# Patient Record
Sex: Male | Born: 1975 | Race: Black or African American | Hispanic: No | Marital: Single | Smoking: Former smoker
Health system: Southern US, Community
[De-identification: ages and names within clinical notes are randomized; demographics above are authoritative.]

## PROBLEM LIST (undated history)

## (undated) DIAGNOSIS — C9000 Multiple myeloma not having achieved remission: Secondary | ICD-10-CM

## (undated) DIAGNOSIS — I10 Essential (primary) hypertension: Secondary | ICD-10-CM

## (undated) DIAGNOSIS — I82402 Acute embolism and thrombosis of unspecified deep veins of left lower extremity: Secondary | ICD-10-CM

## (undated) DIAGNOSIS — F32A Depression, unspecified: Secondary | ICD-10-CM

## (undated) DIAGNOSIS — F329 Major depressive disorder, single episode, unspecified: Secondary | ICD-10-CM

## (undated) HISTORY — PX: BONE BIOPSY: SHX375

## (undated) HISTORY — DX: Multiple myeloma not having achieved remission: C90.00

## (undated) HISTORY — DX: Essential (primary) hypertension: I10

## (undated) HISTORY — DX: Major depressive disorder, single episode, unspecified: F32.9

## (undated) HISTORY — PX: OTHER SURGICAL HISTORY: SHX169

## (undated) HISTORY — DX: Acute embolism and thrombosis of unspecified deep veins of left lower extremity: I82.402

## (undated) HISTORY — DX: Depression, unspecified: F32.A

---

## 1997-07-06 ENCOUNTER — Emergency Department (HOSPITAL_COMMUNITY): Admission: EM | Admit: 1997-07-06 | Discharge: 1997-07-06 | Payer: Self-pay | Admitting: Emergency Medicine

## 1998-09-10 ENCOUNTER — Encounter: Payer: Self-pay | Admitting: Emergency Medicine

## 1998-09-10 ENCOUNTER — Emergency Department (HOSPITAL_COMMUNITY): Admission: EM | Admit: 1998-09-10 | Discharge: 1998-09-10 | Payer: Self-pay | Admitting: Emergency Medicine

## 1998-10-07 ENCOUNTER — Emergency Department (HOSPITAL_COMMUNITY): Admission: EM | Admit: 1998-10-07 | Discharge: 1998-10-07 | Payer: Self-pay | Admitting: Emergency Medicine

## 1999-08-24 ENCOUNTER — Emergency Department (HOSPITAL_COMMUNITY): Admission: EM | Admit: 1999-08-24 | Discharge: 1999-08-24 | Payer: Self-pay | Admitting: Emergency Medicine

## 2001-01-20 ENCOUNTER — Emergency Department (HOSPITAL_COMMUNITY): Admission: EM | Admit: 2001-01-20 | Discharge: 2001-01-20 | Payer: Self-pay | Admitting: Emergency Medicine

## 2001-02-02 ENCOUNTER — Encounter: Payer: Self-pay | Admitting: Emergency Medicine

## 2001-02-02 ENCOUNTER — Emergency Department (HOSPITAL_COMMUNITY): Admission: EM | Admit: 2001-02-02 | Discharge: 2001-02-02 | Payer: Self-pay | Admitting: Emergency Medicine

## 2001-09-12 ENCOUNTER — Emergency Department (HOSPITAL_COMMUNITY): Admission: EM | Admit: 2001-09-12 | Discharge: 2001-09-12 | Payer: Self-pay

## 2002-09-06 ENCOUNTER — Emergency Department (HOSPITAL_COMMUNITY): Admission: EM | Admit: 2002-09-06 | Discharge: 2002-09-06 | Payer: Self-pay | Admitting: Emergency Medicine

## 2003-11-21 ENCOUNTER — Emergency Department (HOSPITAL_COMMUNITY): Admission: EM | Admit: 2003-11-21 | Discharge: 2003-11-21 | Payer: Self-pay | Admitting: Emergency Medicine

## 2003-12-09 ENCOUNTER — Emergency Department (HOSPITAL_COMMUNITY): Admission: EM | Admit: 2003-12-09 | Discharge: 2003-12-09 | Payer: Self-pay | Admitting: Emergency Medicine

## 2004-03-24 ENCOUNTER — Emergency Department (HOSPITAL_COMMUNITY): Admission: EM | Admit: 2004-03-24 | Discharge: 2004-03-26 | Payer: Self-pay | Admitting: Emergency Medicine

## 2010-07-10 ENCOUNTER — Emergency Department (HOSPITAL_COMMUNITY): Payer: Medicaid Other

## 2010-07-10 ENCOUNTER — Emergency Department (HOSPITAL_COMMUNITY)
Admission: EM | Admit: 2010-07-10 | Discharge: 2010-07-10 | Disposition: A | Discharge status: Home / Self Care | Payer: Medicaid Other | Attending: Emergency Medicine | Admitting: Emergency Medicine

## 2010-07-10 DIAGNOSIS — L089 Local infection of the skin and subcutaneous tissue, unspecified: Secondary | ICD-10-CM | POA: Insufficient documentation

## 2010-07-10 DIAGNOSIS — T148XXA Other injury of unspecified body region, initial encounter: Secondary | ICD-10-CM | POA: Insufficient documentation

## 2010-07-10 DIAGNOSIS — M7989 Other specified soft tissue disorders: Secondary | ICD-10-CM | POA: Insufficient documentation

## 2010-07-10 DIAGNOSIS — L84 Corns and callosities: Secondary | ICD-10-CM | POA: Insufficient documentation

## 2010-07-10 DIAGNOSIS — X58XXXA Exposure to other specified factors, initial encounter: Secondary | ICD-10-CM | POA: Insufficient documentation

## 2010-07-10 DIAGNOSIS — F319 Bipolar disorder, unspecified: Secondary | ICD-10-CM | POA: Insufficient documentation

## 2010-07-20 ENCOUNTER — Emergency Department (HOSPITAL_COMMUNITY)
Admission: EM | Admit: 2010-07-20 | Discharge: 2010-07-20 | Disposition: A | Discharge status: Home / Self Care | Payer: Medicaid Other | Attending: Emergency Medicine | Admitting: Emergency Medicine

## 2010-07-20 DIAGNOSIS — L600 Ingrowing nail: Secondary | ICD-10-CM | POA: Insufficient documentation

## 2010-07-20 DIAGNOSIS — M79609 Pain in unspecified limb: Secondary | ICD-10-CM | POA: Insufficient documentation

## 2013-01-30 ENCOUNTER — Ambulatory Visit (INDEPENDENT_AMBULATORY_CARE_PROVIDER_SITE_OTHER): Payer: BC Managed Care – PPO | Admitting: Family Medicine

## 2013-01-30 VITALS — BP 130/84 | HR 120 | Temp 98.7°F | Resp 18 | Ht 71.25 in | Wt 226.0 lb

## 2013-01-30 DIAGNOSIS — J209 Acute bronchitis, unspecified: Secondary | ICD-10-CM

## 2013-01-30 DIAGNOSIS — J111 Influenza due to unidentified influenza virus with other respiratory manifestations: Secondary | ICD-10-CM

## 2013-01-30 LAB — POCT INFLUENZA A/B
Influenza A, POC: POSITIVE
Influenza B, POC: NEGATIVE

## 2013-01-30 MED ORDER — OSELTAMIVIR PHOSPHATE 75 MG PO CAPS: 75.0000 mg | ORAL_CAPSULE | Freq: Two times a day (BID) | ORAL | Status: DC

## 2013-01-30 MED ORDER — IPRATROPIUM BROMIDE 0.03 % NA SOLN: 2.0000 | Freq: Four times a day (QID) | NASAL | Status: DC

## 2013-01-30 MED ORDER — HYDROCOD POLST-CHLORPHEN POLST 10-8 MG/5ML PO LQCR: 5.0000 mL | Freq: Two times a day (BID) | ORAL | Status: DC | PRN

## 2013-01-30 NOTE — Progress Notes (Signed)
Subjective:  This chart was scribed for Norberto Sorenson, MD at [facility] by Yevette Edwards, Scribe. This pt's care was started at 11:32 AM.   Patient ID: Derek [name], male    DOB: 15-Nov-1975, 37 y.o.   MRN: [id number]  Chief Complaint  Patient presents with  . Chest Congestion    X Saturday  . Cough    X Saturday  . Shortness of Breath    X Saturday    HPI  HPI Comments: Derek [name] is a 37 y.o. male who presents to the [facility]  complaining of a non-productive cough which has persisted for four days and which has been accompanied by chest congestion, SOB, a subjective fever, and chills. He has also experienced centrally-located chest pain and a headache following episodes of coughing. Additionally, the coughing has prevented him from sleeping well. He has used OTC cough and cold medication without relief. The pt denies experiencing palpitations, wheezing, myalgia, rhinorrhea, or a sore throat.   His heart rate was initially elevated, and, upon recheck, his heart rate was between 100-101.   He reports that he has been drinking plenty of fluids and voiding normally.   The pt reports that he did not receive an influenza vaccine this year, as is his baseline.   He is a non-smoker.   No current outpatient prescriptions on file prior to visit.   No current facility-administered medications on file prior to visit.    Past Medical History  Diagnosis Date  . Depression     Allergies  Allergen Reactions  . Aspirin Anxiety      Review of Systems  Constitutional: Positive for fever and chills.  HENT: Positive for congestion. Negative for rhinorrhea and sore throat.   Respiratory: Positive for cough and shortness of breath. Negative for wheezing.   Cardiovascular: Negative for palpitations.  Musculoskeletal: Negative for myalgias.    Triage Vitals: BP 130/84  Pulse 120  Temp(Src) 98.7 F (37.1 C) (Oral)  Resp 18  Ht 5' 11.25" (1.81 m)  Wt 226 lb  (102.513 kg)  BMI 31.29 kg/m2  SpO2 98%     Objective:   Physical Exam  Nursing note and vitals reviewed. Constitutional: He is oriented to person, place, and time. He appears well-developed and well-nourished. No distress.  HENT:  Head: Normocephalic and atraumatic.  Right Ear: Tympanic membrane and external ear normal. Tympanic membrane is not injected, not erythematous, not retracted and not bulging.  Left Ear: Tympanic membrane and external ear normal. Tympanic membrane is not injected, not erythematous, not retracted and not bulging.  Mouth/Throat: Posterior oropharyngeal erythema present.  Erythematous.   Eyes: EOM are normal.  Neck: Neck supple. No tracheal deviation present. No thyromegaly present.  Cardiovascular: Regular rhythm and normal heart sounds.   No murmur heard. Borderline tachycardic.   Pulmonary/Chest: Effort normal. No respiratory distress.  Musculoskeletal: Normal range of motion.  Lymphadenopathy:       Head (right side): No submental, no submandibular, no tonsillar, no preauricular and no posterior auricular adenopathy present.       Head (left side): No submental, no submandibular, no tonsillar, no preauricular and no posterior auricular adenopathy present.    He has no cervical adenopathy.  Neurological: He is alert and oriented to person, place, and time.  Skin: Skin is warm and dry.  Psychiatric: He has a normal mood and affect. His behavior is normal.   Results for orders placed in visit on 01/30/13  POCT INFLUENZA A/B  Result Value Range   Influenza A, POC Positive     Influenza B, POC Negative         Assessment & Plan:   Acute bronchitis - Plan: POCT Influenza A/B  Influenza with other respiratory manifestations  Meds ordered this encounter  Medications  . guaiFENesin-dextromethorphan (ROBITUSSIN DM) 100-10 MG/5ML syrup    Sig: Take 5 mLs by mouth every 4 (four) hours as needed for cough.  . DISCONTD: Pseudoeph-Doxylamine-DM-APAP  (NYQUIL PO)    Sig: Take by mouth.  Marland Kitchen ipratropium (ATROVENT) 0.03 % nasal spray    Sig: Place 2 sprays into the nose 4 (four) times daily.    Dispense:  30 mL    Refill:  1  . chlorpheniramine-HYDROcodone (TUSSIONEX PENNKINETIC ER) 10-8 MG/5ML LQCR    Sig: Take 5 mLs by mouth every 12 (twelve) hours as needed for cough (cough).    Dispense:  120 mL    Refill:  0  . oseltamivir (TAMIFLU) 75 MG capsule    Sig: Take 1 capsule (75 mg total) by mouth 2 (two) times daily.    Dispense:  10 capsule    Refill:  0    I personally performed the services described in this documentation, which was scribed in my presence. The recorded information has been reviewed and considered, and addended by me as needed.  Norberto Sorenson, MD MPH

## 2013-01-30 NOTE — Patient Instructions (Addendum)
Your elevated heart rate is likely due to the over-the-counter cough and cold medications you have been taking.  Please try to stay away from any of the products with pseudoephedrine or phenylephrine in them.  Bronchitis Bronchitis is the body's way of reacting to injury and/or infection (inflammation) of the bronchi. Bronchi are the air tubes that extend from the windpipe into the lungs. If the inflammation becomes severe, it may cause shortness of breath. CAUSES  Inflammation may be caused by:  A virus.  Germs (bacteria).  Dust.  Allergens.  Pollutants and many other irritants. The cells lining the bronchial tree are covered with tiny hairs (cilia). These constantly beat upward, away from the lungs, toward the mouth. This keeps the lungs free of pollutants. When these cells become too irritated and are unable to do their job, mucus begins to develop. This causes the characteristic cough of bronchitis. The cough clears the lungs when the cilia are unable to do their job. Without either of these protective mechanisms, the mucus would settle in the lungs. Then you would develop pneumonia. Smoking is a common cause of bronchitis and can contribute to pneumonia. Stopping this habit is the single most important thing you can do to help yourself. TREATMENT   Your caregiver may prescribe an antibiotic if the cough is caused by bacteria. Also, medicines that open up your airways make it easier to breathe. Your caregiver may also recommend or prescribe an expectorant. It will loosen the mucus to be coughed up. Only take over-the-counter or prescription medicines for pain, discomfort, or fever as directed by your caregiver.  Removing whatever causes the problem (smoking, for example) is critical to preventing the problem from getting worse.  Cough suppressants may be prescribed for relief of cough symptoms.  Inhaled medicines may be prescribed to help with symptoms now and to help prevent problems  from returning.  For those with recurrent (chronic) bronchitis, there may be a need for steroid medicines. SEEK IMMEDIATE MEDICAL CARE IF:   During treatment, you develop more pus-like mucus (purulent sputum).  You have a fever.  You become progressively more ill.  You have increased difficulty breathing, wheezing, or shortness of breath. It is necessary to seek immediate medical care if you are elderly or sick from any other disease. MAKE SURE YOU:   Understand these instructions.  Will watch your condition.  Will get help right away if you are not doing well or get worse. Document Released: 03/08/2005 Document Revised: 11/08/2012 Document Reviewed: 10/31/2012 [facility] Patient Information 2014 [place], [place].

## 2013-03-20 ENCOUNTER — Ambulatory Visit: Payer: BC Managed Care – PPO

## 2013-03-20 ENCOUNTER — Telehealth: Payer: Self-pay

## 2013-03-20 ENCOUNTER — Ambulatory Visit (INDEPENDENT_AMBULATORY_CARE_PROVIDER_SITE_OTHER): Payer: BC Managed Care – PPO | Admitting: Family Medicine

## 2013-03-20 VITALS — BP 132/94 | HR 82 | Temp 97.4°F | Resp 16 | Ht 72.0 in | Wt 239.0 lb

## 2013-03-20 DIAGNOSIS — S40011A Contusion of right shoulder, initial encounter: Secondary | ICD-10-CM

## 2013-03-20 DIAGNOSIS — S8000XA Contusion of unspecified knee, initial encounter: Secondary | ICD-10-CM

## 2013-03-20 DIAGNOSIS — S8001XA Contusion of right knee, initial encounter: Secondary | ICD-10-CM

## 2013-03-20 DIAGNOSIS — M79645 Pain in left finger(s): Secondary | ICD-10-CM

## 2013-03-20 DIAGNOSIS — M79609 Pain in unspecified limb: Secondary | ICD-10-CM

## 2013-03-20 DIAGNOSIS — S40019A Contusion of unspecified shoulder, initial encounter: Secondary | ICD-10-CM

## 2013-03-20 MED ORDER — HYDROCODONE-ACETAMINOPHEN 5-325 MG PO TABS: ORAL_TABLET | ORAL | Status: DC

## 2013-03-20 NOTE — Telephone Encounter (Signed)
Patients wife is calling saying that he needs to get a doctors note from his visit today please call [phone]

## 2013-03-20 NOTE — Patient Instructions (Addendum)
Take Aleve 2 twice daily for pain and inflammation upper  Take hydrocodone at bedtime for pain relief so he can sleep  If shoulder especially is not doing better over the next week or 10 days return for recheck. If there is concern of a rotator cuff injury we might have to make a referral elsewhere.  Wear the splint on the left wrist for the next 4 or 5 days or until thumb  pain is much better.  Use Polysporin ointment on your knee
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
## 2013-03-20 NOTE — Telephone Encounter (Signed)
Called pt's wife, note ready to pick up. Will leave at front. LMOM to let her know.

## 2013-03-21 ENCOUNTER — Telehealth: Payer: Self-pay

## 2013-03-21 NOTE — Telephone Encounter (Signed)
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

## 2013-03-22 NOTE — Telephone Encounter (Signed)
New letter printed, given to girlfriend [place]).

## 2013-03-22 NOTE — Telephone Encounter (Signed)
Please advise on patients request for 7-10 days off work.

## 2013-03-24 NOTE — Telephone Encounter (Signed)
Noted  

## 2013-03-25 ENCOUNTER — Ambulatory Visit: Payer: BC Managed Care – PPO

## 2013-03-26 ENCOUNTER — Other Ambulatory Visit (HOSPITAL_COMMUNITY): Payer: Self-pay | Admitting: Chiropractic Medicine

## 2013-03-26 ENCOUNTER — Ambulatory Visit (INDEPENDENT_AMBULATORY_CARE_PROVIDER_SITE_OTHER): Payer: BC Managed Care – PPO | Admitting: Internal Medicine

## 2013-03-26 VITALS — BP 134/93 | HR 101 | Temp 97.8°F | Resp 18 | Ht 71.0 in | Wt 237.0 lb

## 2013-03-26 DIAGNOSIS — M79661 Pain in right lower leg: Secondary | ICD-10-CM

## 2013-03-26 DIAGNOSIS — M79609 Pain in unspecified limb: Secondary | ICD-10-CM

## 2013-03-26 DIAGNOSIS — R52 Pain, unspecified: Secondary | ICD-10-CM

## 2013-03-26 DIAGNOSIS — M7660 Achilles tendinitis, unspecified leg: Secondary | ICD-10-CM

## 2013-03-26 DIAGNOSIS — Z6833 Body mass index (BMI) 33.0-33.9, adult: Secondary | ICD-10-CM

## 2013-03-26 MED ORDER — MELOXICAM 15 MG PO TABS: 15.0000 mg | ORAL_TABLET | Freq: Every day | ORAL | Status: DC

## 2013-03-26 MED ORDER — CYCLOBENZAPRINE HCL 10 MG PO TABS: 10.0000 mg | ORAL_TABLET | Freq: Every day | ORAL | Status: DC
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
## 2013-03-27 DIAGNOSIS — Z6833 Body mass index (BMI) 33.0-33.9, adult: Secondary | ICD-10-CM | POA: Insufficient documentation

## 2013-03-29 ENCOUNTER — Encounter (HOSPITAL_COMMUNITY): Payer: BC Managed Care – PPO

## 2013-03-29 ENCOUNTER — Other Ambulatory Visit (HOSPITAL_COMMUNITY): Payer: Self-pay | Admitting: Internal Medicine

## 2013-03-29 ENCOUNTER — Telehealth: Payer: Self-pay | Admitting: Family Medicine

## 2013-03-29 NOTE — Telephone Encounter (Signed)
Patient came in office today to check on U/S referral because he hasn't heard anything since he was seen on Monday. Got U/S set up for Thursday 1/15 @ 11a. Per Dr. Laney Pastor can fit for camwalker to hopefully give some relief until U/S done. Crutches if patient would like as well. Also needs to f/u on Monday. Patient notified of appt and he is coming in this afternoon for camwalker.

## 2013-03-29 NOTE — Progress Notes (Signed)
Patient came in office and got fit and trained for R short camwalker and crutches.   Dr. Laney Pastor ordered tall camwalker but we were out of XL tall camwalkers. Discussed with Christell Faith PA-C and was placed in Short Cam and crutches

## 2013-03-30 ENCOUNTER — Other Ambulatory Visit: Payer: Self-pay | Admitting: Orthopedic Surgery

## 2013-03-30 ENCOUNTER — Ambulatory Visit (HOSPITAL_COMMUNITY)
Admission: RE | Admit: 2013-03-30 | Discharge: 2013-03-30 | Disposition: A | Discharge status: Home / Self Care | Payer: BC Managed Care – PPO | Source: Ambulatory Visit | Attending: Chiropractic Medicine | Admitting: Chiropractic Medicine

## 2013-03-30 ENCOUNTER — Ambulatory Visit
Admission: RE | Admit: 2013-03-30 | Discharge: 2013-03-30 | Disposition: A | Discharge status: Home / Self Care | Payer: BC Managed Care – PPO | Source: Ambulatory Visit | Attending: Orthopedic Surgery | Admitting: Orthopedic Surgery

## 2013-03-30 DIAGNOSIS — M79661 Pain in right lower leg: Secondary | ICD-10-CM

## 2013-03-30 DIAGNOSIS — S46909A Unspecified injury of unspecified muscle, fascia and tendon at shoulder and upper arm level, unspecified arm, initial encounter: Secondary | ICD-10-CM | POA: Insufficient documentation

## 2013-03-30 DIAGNOSIS — M7989 Other specified soft tissue disorders: Secondary | ICD-10-CM

## 2013-03-30 DIAGNOSIS — S4980XA Other specified injuries of shoulder and upper arm, unspecified arm, initial encounter: Secondary | ICD-10-CM | POA: Insufficient documentation

## 2013-03-30 DIAGNOSIS — R52 Pain, unspecified: Secondary | ICD-10-CM

## 2013-04-02 ENCOUNTER — Other Ambulatory Visit: Payer: Self-pay | Admitting: Emergency Medicine

## 2013-04-02 ENCOUNTER — Ambulatory Visit (INDEPENDENT_AMBULATORY_CARE_PROVIDER_SITE_OTHER): Payer: BC Managed Care – PPO | Admitting: Internal Medicine

## 2013-04-02 VITALS — BP 132/82 | HR 81 | Temp 98.7°F | Resp 17 | Ht 73.0 in | Wt 245.0 lb

## 2013-04-02 DIAGNOSIS — G4700 Insomnia, unspecified: Secondary | ICD-10-CM

## 2013-04-02 DIAGNOSIS — M25519 Pain in unspecified shoulder: Secondary | ICD-10-CM

## 2013-04-02 DIAGNOSIS — I8000 Phlebitis and thrombophlebitis of superficial vessels of unspecified lower extremity: Secondary | ICD-10-CM

## 2013-04-02 DIAGNOSIS — M25511 Pain in right shoulder: Secondary | ICD-10-CM

## 2013-04-02 DIAGNOSIS — I8001 Phlebitis and thrombophlebitis of superficial vessels of right lower extremity: Secondary | ICD-10-CM

## 2013-04-02 MED ORDER — OXYCODONE-ACETAMINOPHEN 10-325 MG PO TABS: 1.0000 | ORAL_TABLET | Freq: Three times a day (TID) | ORAL | Status: DC | PRN

## 2013-04-02 MED ORDER — ALPRAZOLAM 0.5 MG PO TABS: 0.5000 mg | ORAL_TABLET | Freq: Every evening | ORAL | Status: DC | PRN

## 2013-04-02 MED ORDER — OXYCODONE-ACETAMINOPHEN 10-325 MG PO TABS: 30.0000 | ORAL_TABLET | Freq: Three times a day (TID) | ORAL | Status: DC | PRN

## 2013-04-02 NOTE — Progress Notes (Signed)
[facility] called- Percocet prescription directions stated to take 30 tablets every 6-8 hours with a qty of 20. Pt is returning to office to have rx rewritten.

## 2013-04-02 NOTE — Patient Instructions (Signed)
Phlebitis Phlebitis is soreness and swelling (inflammation) of a vein. This can occur in your arms, legs, or torso (trunk), as well as deeper inside your body. Phlebitis is usually not serious when it occurs close to the surface of the body. However, it can cause serious problems when it occurs in a vein deeper inside the body. CAUSES  Phlebitis can be triggered by various things, including:   Reduced blood flow through your veins. This can happen with:  Bed rest over a long period.  Long-distance travel.  Injury.  Surgery.  Being overweight (obese) or pregnant.  Having an IV tube put in the vein and getting certain medicines through the vein.  Cancer and cancer treatment.  Use of illegal drugs taken through the vein.  Inflammatory diseases.  Inherited (genetic) diseases that increase the risk of blood clots.  Hormone therapy, such as birth control pills. SIGNS AND SYMPTOMS   Red, tender, swollen, and painful area on your skin. Usually, the area will be long and narrow.  Firmness along the center of the affected area. This can indicate that a blood clot has formed.  Low-grade fever. DIAGNOSIS  A health care provider can usually diagnose phlebitis by examining the affected area and asking about your symptoms. To check for infection or blood clots, your health care provider may order blood tests or an ultrasound exam of the area. Blood tests and your family history may also indicate if you have an underlying genetic disease that causes blood clots. Occasionally, a piece of tissue is taken from the body (biopsy sample) if an unusual cause of phlebitis is suspected. TREATMENT  Treatment will vary depending on the severity of the condition and the area of the body affected. Treatment may include:  Use of a warm compress or heating pad.  Use of compression stockings or bandages.  Anti-inflammatory medicines.  Removal of any IV tube that may be causing the problem.  Medicines  that kill germs (antibiotics) if an infection is present.  Blood-thinning medicines if a blood clot is suspected or present.  In rare cases, surgery may be needed to remove damaged sections of vein. HOME CARE INSTRUCTIONS   Only take over-the-counter or prescription medicines as directed by your health care provider. Take all medicines exactly as prescribed.  Raise (elevate) the affected area above the level of your heart as directed by your health care provider.  Apply a warm compress or heating pad to the affected area as directed by your health care provider. Do not sleep with the heating pad.  Use compression stockings or bandages as directed. These will speed healing and prevent the condition from coming back.  If you are on blood thinners:  Get follow-up blood tests as directed by your health care provider.  Check with your health care provider before using any new medicines.  Carry a medical alert card or wear your medical alert jewelry to show that you are on blood thinners.  For phlebitis in the legs:  Avoid prolonged standing or bed rest.  Keep your legs moving. Raise your legs when sitting or lying.  Do not smoke.  Women, particularly those over the age of 35, should consider the risks and benefits of taking the contraceptive pill. This kind of hormone treatment can increase your risk for blood clots.  Follow up with your health care provider as directed. SEEK MEDICAL CARE IF:   You have unusual bruising or any bleeding problems.  Your swelling or pain in the affected area   is not improving.  You are on anti-inflammatory medicine, and you develop belly (abdominal) pain. SEEK IMMEDIATE MEDICAL CARE IF:   You have a sudden onset of chest pain or difficulty breathing.  You have a fever or persistent symptoms for more than 2 3 days.  You have a fever and your symptoms suddenly get worse. MAKE SURE YOU:  Understand these instructions.  Will watch your  condition.  Will get help right away if you are not doing well or get worse. Document Released: 03/02/2001 Document Revised: 12/27/2012 Document Reviewed: 11/13/2012 [facility] Patient Information 2014 [place]. Shoulder Pain The shoulder is the joint that connects your arms to your body. The bones that form the shoulder joint include the upper arm bone (humerus), the shoulder blade (scapula), and the collarbone (clavicle). The top of the humerus is shaped like a ball and fits into a rather flat socket on the scapula (glenoid cavity). A combination of muscles and strong, fibrous tissues that connect muscles to bones (tendons) support your shoulder joint and hold the ball in the socket. Small, fluid-filled sacs (bursae) are located in different areas of the joint. They act as cushions between the bones and the overlying soft tissues and help reduce friction between the gliding tendons and the bone as you move your arm. Your shoulder joint allows a wide range of motion in your arm. This range of motion allows you to do things like scratch your back or throw a ball. However, this range of motion also makes your shoulder more prone to pain from overuse and injury. Causes of shoulder pain can originate from both injury and overuse and usually can be grouped in the following four categories:  Redness, swelling, and pain (inflammation) of the tendon (tendinitis) or the bursae (bursitis).  Instability, such as a dislocation of the joint.  Inflammation of the joint (arthritis).  Broken bone (fracture). HOME CARE INSTRUCTIONS   Apply ice to the sore area.  Put ice in a plastic bag.  Place a towel between your skin and the bag.  Leave the ice on for 15-20 minutes, 03-04 times per day for the first 2 days.  Stop using cold packs if they do not help with the pain.  If you have a shoulder sling or immobilizer, wear it as long as your caregiver instructs. Only remove it to shower or bathe. Move  your arm as little as possible, but keep your hand moving to prevent swelling.  Squeeze a soft ball or foam pad as much as possible to help prevent swelling.  Only take over-the-counter or prescription medicines for pain, discomfort, or fever as directed by your caregiver. SEEK MEDICAL CARE IF:   Your shoulder pain increases, or new pain develops in your arm, hand, or fingers.  Your hand or fingers become cold and numb.  Your pain is not relieved with medicines. SEEK IMMEDIATE MEDICAL CARE IF:   Your arm, hand, or fingers are numb or tingling.  Your arm, hand, or fingers are significantly swollen or turn white or blue. MAKE SURE YOU:   Understand these instructions.  Will watch your condition.  Will get help right away if you are not doing well or get worse. Document Released: 12/16/2004 Document Revised: 12/01/2011 Document Reviewed: 02/20/2011 [facility] Patient Information 2014 [place]. Impingement Syndrome, Rotator Cuff, Bursitis with Rehab Impingement syndrome is a condition that involves inflammation of the tendons of the rotator cuff and the subacromial bursa, that causes pain in the shoulder. The rotator cuff consists  of four tendons and muscles that control much of the shoulder and upper arm function. The subacromial bursa is a fluid filled sac that helps reduce friction between the rotator cuff and one of the bones of the shoulder (acromion). Impingement syndrome is usually an overuse injury that causes swelling of the bursa (bursitis), swelling of the tendon (tendonitis), and/or a tear of the tendon (strain). Strains are classified into three categories. Grade 1 strains cause pain, but the tendon is not lengthened. Grade 2 strains include a lengthened ligament, due to the ligament being stretched or partially ruptured. With grade 2 strains there is still function, although the function may be decreased. Grade 3 strains include a complete tear of the tendon or muscle, and  function is usually impaired. SYMPTOMS   Pain around the shoulder, often at the outer portion of the upper arm.  Pain that gets worse with shoulder function, especially when reaching overhead or lifting.  Sometimes, aching when not using the arm.  Pain that wakes you up at night.  Sometimes, tenderness, swelling, warmth, or redness over the affected area.  Loss of strength.  Limited motion of the shoulder, especially reaching behind the back (to the back pocket or to unhook bra) or across your body.  Crackling sound (crepitation) when moving the arm.  Biceps tendon pain and inflammation (in the front of the shoulder). Worse when bending the elbow or lifting. CAUSES  Impingement syndrome is often an overuse injury, in which chronic (repetitive) motions cause the tendons or bursa to become inflamed. A strain occurs when a force is paced on the tendon or muscle that is greater than it can withstand. Common mechanisms of injury include: Stress from sudden increase in duration, frequency, or intensity of training.  Direct hit (trauma) to the shoulder.  Aging, erosion of the tendon with normal use.  Bony bump on shoulder (acromial spur). RISK INCREASES WITH:  Contact sports (football, wrestling, boxing).  Throwing sports (baseball, tennis, volleyball).  Weightlifting and bodybuilding.  Heavy labor.  Previous injury to the rotator cuff, including impingement.  Poor shoulder strength and flexibility.  Failure to warm up properly before activity.  Inadequate protective equipment.  Old age.  Bony bump on shoulder (acromial spur). PREVENTION   Warm up and stretch properly before activity.  Allow for adequate recovery between workouts.  Maintain physical fitness:  Strength, flexibility, and endurance.  Cardiovascular fitness.  Learn and use proper exercise technique. PROGNOSIS  If treated properly, impingement syndrome usually goes away within 6 weeks. Sometimes  surgery is required.  RELATED COMPLICATIONS   Longer healing time if not properly treated, or if not given enough time to heal.  Recurring symptoms, that result in a chronic condition.  Shoulder stiffness, frozen shoulder, or loss of motion.  Rotator cuff tendon tear.  Recurring symptoms, especially if activity is resumed too soon, with overuse, with a direct blow, or when using poor technique. TREATMENT  Treatment first involves the use of ice and medicine, to reduce pain and inflammation. The use of strengthening and stretching exercises may help reduce pain with activity. These exercises may be performed at home or with a therapist. If non-surgical treatment is unsuccessful after more than 6 months, surgery may be advised. After surgery and rehabilitation, activity is usually possible in 3 months.  MEDICATION  If pain medicine is needed, nonsteroidal anti-inflammatory medicines (aspirin and ibuprofen), or other minor pain relievers (acetaminophen), are often advised.  Do not take pain medicine for 7 days before surgery.  Prescription pain relievers may be given, if your caregiver thinks they are needed. Use only as directed and only as much as you need.  Corticosteroid injections may be given by your caregiver. These injections should be reserved for the most serious cases, because they may only be given a certain number of times. HEAT AND COLD  Cold treatment (icing) should be applied for 10 to 15 minutes every 2 to 3 hours for inflammation and pain, and immediately after activity that aggravates your symptoms. Use ice packs or an ice massage.  Heat treatment may be used before performing stretching and strengthening activities prescribed by your caregiver, physical therapist, or athletic trainer. Use a heat pack or a warm water soak. SEEK MEDICAL CARE IF:   Symptoms get worse or do not improve in 4 to 6 weeks, despite treatment.  New, unexplained symptoms develop. (Drugs used in  treatment may produce side effects.) EXERCISES  RANGE OF MOTION (ROM) AND STRETCHING EXERCISES - Impingement Syndrome (Rotator Cuff  Tendinitis, Bursitis) These exercises may help you when beginning to rehabilitate your injury. Your symptoms may go away with or without further involvement from your physician, physical therapist or athletic trainer. While completing these exercises, remember:   Restoring tissue flexibility helps normal motion to return to the joints. This allows healthier, less painful movement and activity.  An effective stretch should be held for at least 30 seconds.  A stretch should never be painful. You should only feel a gentle lengthening or release in the stretched tissue. STRETCH  Flexion, Standing  Stand with good posture. With an underhand grip on your right / left hand, and an overhand grip on the opposite hand, grasp a broomstick or cane so that your hands are a little more than shoulder width apart.  Keeping your right / left elbow straight and shoulder muscles relaxed, push the stick with your opposite hand, to raise your right / left arm in front of your body and then overhead. Raise your arm until you feel a stretch in your right / left shoulder, but before you have increased shoulder pain.  Try to avoid shrugging your right / left shoulder as your arm rises, by keeping your shoulder blade tucked down and toward your mid-back spine. Hold for __________ seconds.  Slowly return to the starting position. Repeat __________ times. Complete this exercise __________ times per day. STRETCH  Abduction, Supine  Lie on your back. With an underhand grip on your right / left hand and an overhand grip on the opposite hand, grasp a broomstick or cane so that your hands are a little more than shoulder width apart.  Keeping your right / left elbow straight and your shoulder muscles relaxed, push the stick with your opposite hand, to raise your right / left arm out to the side  of your body and then overhead. Raise your arm until you feel a stretch in your right / left shoulder, but before you have increased shoulder pain.  Try to avoid shrugging your right / left shoulder as your arm rises, by keeping your shoulder blade tucked down and toward your mid-back spine. Hold for __________ seconds.  Slowly return to the starting position. Repeat __________ times. Complete this exercise __________ times per day. ROM  Flexion, Active-Assisted  Lie on your back. You may bend your knees for comfort.  Grasp a broomstick or cane so your hands are about shoulder width apart. Your right / left hand should grip the end of the stick, so  that your hand is positioned "thumbs-up," as if you were about to shake hands.  Using your healthy arm to lead, raise your right / left arm overhead, until you feel a gentle stretch in your shoulder. Hold for __________ seconds.  Use the stick to assist in returning your right / left arm to its starting position. Repeat __________ times. Complete this exercise __________ times per day.  ROM - Internal Rotation, Supine   Lie on your back on a firm surface. Place your right / left elbow about 60 degrees away from your side. Elevate your elbow with a folded towel, so that the elbow and shoulder are the same height.  Using a broomstick or cane and your strong arm, pull your right / left hand toward your body until you feel a gentle stretch, but no increase in your shoulder pain. Keep your shoulder and elbow in place throughout the exercise.  Hold for __________ seconds. Slowly return to the starting position. Repeat __________ times. Complete this exercise __________ times per day. STRETCH - Internal Rotation  Place your right / left hand behind your back, palm up.  Throw a towel or belt over your opposite shoulder. Grasp the towel with your right / left hand.  While keeping an upright posture, gently pull up on the towel, until you feel a  stretch in the front of your right / left shoulder.  Avoid shrugging your right / left shoulder as your arm rises, by keeping your shoulder blade tucked down and toward your mid-back spine.  Hold for __________ seconds. Release the stretch, by lowering your healthy hand. Repeat __________ times. Complete this exercise __________ times per day. ROM - Internal Rotation   Using an underhand grip, grasp a stick behind your back with both hands.  While standing upright with good posture, slide the stick up your back until you feel a mild stretch in the front of your shoulder.  Hold for __________ seconds. Slowly return to your starting position. Repeat __________ times. Complete this exercise __________ times per day.  STRETCH  Posterior Shoulder Capsule   Stand or sit with good posture. Grasp your right / left elbow and draw it across your chest, keeping it at the same height as your shoulder.  Pull your elbow, so your upper arm comes in closer to your chest. Pull until you feel a gentle stretch in the back of your shoulder.  Hold for __________ seconds. Repeat __________ times. Complete this exercise __________ times per day. STRENGTHENING EXERCISES - Impingement Syndrome (Rotator Cuff Tendinitis, Bursitis) These exercises may help you when beginning to rehabilitate your injury. They may resolve your symptoms with or without further involvement from your physician, physical therapist or athletic trainer. While completing these exercises, remember:  Muscles can gain both the endurance and the strength needed for everyday activities through controlled exercises.  Complete these exercises as instructed by your physician, physical therapist or athletic trainer. Increase the resistance and repetitions only as guided.  You may experience muscle soreness or fatigue, but the pain or discomfort you are trying to eliminate should never worsen during these exercises. If this pain does get worse, stop  and make sure you are following the directions exactly. If the pain is still present after adjustments, discontinue the exercise until you can discuss the trouble with your clinician.  During your recovery, avoid activity or exercises which involve actions that place your injured hand or elbow above your head or behind your back or head. These positions  stress the tissues which you are trying to heal. STRENGTH - Scapular Depression and Adduction   With good posture, sit on a firm chair. Support your arms in front of you, with pillows, arm rests, or on a table top. Have your elbows in line with the sides of your body.  Gently draw your shoulder blades down and toward your mid-back spine. Gradually increase the tension, without tensing the muscles along the top of your shoulders and the back of your neck.  Hold for __________ seconds. Slowly release the tension and relax your muscles completely before starting the next repetition.  After you have practiced this exercise, remove the arm support and complete the exercise in standing as well as sitting position. Repeat __________ times. Complete this exercise __________ times per day.  STRENGTH - Shoulder Abductors, Isometric  With good posture, stand or sit about 4-6 inches from a wall, with your right / left side facing the wall.  Bend your right / left elbow. Gently press your right / left elbow into the wall. Increase the pressure gradually, until you are pressing as hard as you can, without shrugging your shoulder or increasing any shoulder discomfort.  Hold for __________ seconds.  Release the tension slowly. Relax your shoulder muscles completely before you begin the next repetition. Repeat __________ times. Complete this exercise __________ times per day.  STRENGTH - External Rotators, Isometric  Keep your right / left elbow at your side and bend it 90 degrees.  Step into a door frame so that the outside of your right / left wrist can  press against the door frame without your upper arm leaving your side.  Gently press your right / left wrist into the door frame, as if you were trying to swing the back of your hand away from your stomach. Gradually increase the tension, until you are pressing as hard as you can, without shrugging your shoulder or increasing any shoulder discomfort.  Hold for __________ seconds.  Release the tension slowly. Relax your shoulder muscles completely before you begin the next repetition. Repeat __________ times. Complete this exercise __________ times per day.  STRENGTH - Supraspinatus   Stand or sit with good posture. Grasp a __________ weight, or an exercise band or tubing, so that your hand is "thumbs-up," like you are shaking hands.  Slowly lift your right / left arm in a "V" away from your thigh, diagonally into the space between your side and straight ahead. Lift your hand to shoulder height or as far as you can, without increasing any shoulder pain. At first, many people do not lift their hands above shoulder height.  Avoid shrugging your right / left shoulder as your arm rises, by keeping your shoulder blade tucked down and toward your mid-back spine.  Hold for __________ seconds. Control the descent of your hand, as you slowly return to your starting position. Repeat __________ times. Complete this exercise __________ times per day.  STRENGTH - External Rotators  Secure a rubber exercise band or tubing to a fixed object (table, pole) so that it is at the same height as your right / left elbow when you are standing or sitting on a firm surface.  Stand or sit so that the secured exercise band is at your uninjured side.  Bend your right / left elbow 90 degrees. Place a folded towel or small pillow under your right / left arm, so that your elbow is a few inches away from your side.  Keeping the  tension on the exercise band, pull it away from your body, as if pivoting on your elbow. Be sure  to keep your body steady, so that the movement is coming only from your rotating shoulder.  Hold for __________ seconds. Release the tension in a controlled manner, as you return to the starting position. Repeat __________ times. Complete this exercise __________ times per day.  STRENGTH - Internal Rotators   Secure a rubber exercise band or tubing to a fixed object (table, pole) so that it is at the same height as your right / left elbow when you are standing or sitting on a firm surface.  Stand or sit so that the secured exercise band is at your right / left side.  Bend your elbow 90 degrees. Place a folded towel or small pillow under your right / left arm so that your elbow is a few inches away from your side.  Keeping the tension on the exercise band, pull it across your body, toward your stomach. Be sure to keep your body steady, so that the movement is coming only from your rotating shoulder.  Hold for __________ seconds. Release the tension in a controlled manner, as you return to the starting position. Repeat __________ times. Complete this exercise __________ times per day.  STRENGTH - Scapular Protractors, Standing   Stand arms length away from a wall. Place your hands on the wall, keeping your elbows straight.  Begin by dropping your shoulder blades down and toward your mid-back spine.  To strengthen your protractors, keep your shoulder blades down, but slide them forward on your rib cage. It will feel as if you are lifting the back of your rib cage away from the wall. This is a subtle motion and can be challenging to complete. Ask your caregiver for further instruction, if you are not sure you are doing the exercise correctly.  Hold for __________ seconds. Slowly return to the starting position, resting the muscles completely before starting the next repetition. Repeat __________ times. Complete this exercise __________ times per day. STRENGTH - Scapular Protractors,  Supine  Lie on your back on a firm surface. Extend your right / left arm straight into the air while holding a __________ weight in your hand.  Keeping your head and back in place, lift your shoulder off the floor.  Hold for __________ seconds. Slowly return to the starting position, and allow your muscles to relax completely before starting the next repetition. Repeat __________ times. Complete this exercise __________ times per day. STRENGTH - Scapular Protractors, Quadruped  Get onto your hands and knees, with your shoulders directly over your hands (or as close as you can be, comfortably).  Keeping your elbows locked, lift the back of your rib cage up into your shoulder blades, so your mid-back rounds out. Keep your neck muscles relaxed.  Hold this position for __________ seconds. Slowly return to the starting position and allow your muscles to relax completely before starting the next repetition. Repeat __________ times. Complete this exercise __________ times per day.  STRENGTH - Scapular Retractors  Secure a rubber exercise band or tubing to a fixed object (table, pole), so that it is at the height of your shoulders when you are either standing, or sitting on a firm armless chair.  With a palm down grip, grasp an end of the band in each hand. Straighten your elbows and lift your hands straight in front of you, at shoulder height. Step back, away from the secured end of  the band, until it becomes tense.  Squeezing your shoulder blades together, draw your elbows back toward your sides, as you bend them. Keep your upper arms lifted away from your body throughout the exercise.  Hold for __________ seconds. Slowly ease the tension on the band, as you reverse the directions and return to the starting position. Repeat __________ times. Complete this exercise __________ times per day. STRENGTH - Shoulder Extensors   Secure a rubber exercise band or tubing to a fixed object (table, pole) so  that it is at the height of your shoulders when you are either standing, or sitting on a firm armless chair.  With a thumbs-up grip, grasp an end of the band in each hand. Straighten your elbows and lift your hands straight in front of you, at shoulder height. Step back, away from the secured end of the band, until it becomes tense.  Squeezing your shoulder blades together, pull your hands down to the sides of your thighs. Do not allow your hands to go behind you.  Hold for __________ seconds. Slowly ease the tension on the band, as you reverse the directions and return to the starting position. Repeat __________ times. Complete this exercise __________ times per day.  STRENGTH - Scapular Retractors and External Rotators   Secure a rubber exercise band or tubing to a fixed object (table, pole) so that it is at the height as your shoulders, when you are either standing, or sitting on a firm armless chair.  With a palm down grip, grasp an end of the band in each hand. Bend your elbows 90 degrees and lift your elbows to shoulder height, at your sides. Step back, away from the secured end of the band, until it becomes tense.  Squeezing your shoulder blades together, rotate your shoulders so that your upper arms and elbows remain stationary, but your fists travel upward to head height.  Hold for __________ seconds. Slowly ease the tension on the band, as you reverse the directions and return to the starting position. Repeat __________ times. Complete this exercise __________ times per day.  STRENGTH - Scapular Retractors and External Rotators, Rowing   Secure a rubber exercise band or tubing to a fixed object (table, pole) so that it is at the height of your shoulders, when you are either standing, or sitting on a firm armless chair.  With a palm down grip, grasp an end of the band in each hand. Straighten your elbows and lift your hands straight in front of you, at shoulder height. Step back, away  from the secured end of the band, until it becomes tense.  Step 1: Squeeze your shoulder blades together. Bending your elbows, draw your hands to your chest, as if you are rowing a boat. At the end of this motion, your hands and elbow should be at shoulder height and your elbows should be out to your sides.  Step 2: Rotate your shoulders, to raise your hands above your head. Your forearms should be vertical and your upper arms should be horizontal.  Hold for __________ seconds. Slowly ease the tension on the band, as you reverse the directions and return to the starting position. Repeat __________ times. Complete this exercise __________ times per day.  STRENGTH  Scapular Depressors  Find a sturdy chair without wheels, such as a dining room chair.  Keeping your feet on the floor, and your hands on the chair arms, lift your bottom up from the seat, and lock your elbows.  Keeping your elbows straight, allow gravity to pull your body weight down. Your shoulders will rise toward your ears.  Raise your body against gravity by drawing your shoulder blades down your back, shortening the distance between your shoulders and ears. Although your feet should always maintain contact with the floor, your feet should progressively support less body weight, as you get stronger.  Hold for __________ seconds. In a controlled and slow manner, lower your body weight to begin the next repetition. Repeat __________ times. Complete this exercise __________ times per day.  Document Released: 03/08/2005 Document Revised: 05/31/2011 Document Reviewed: 06/20/2008 [facility] Patient Information 2014 [place], [place].

## 2013-04-02 NOTE — Progress Notes (Signed)
   Subjective:    Patient ID: Derek [name], male    DOB: [birth year], 38 y.o.   MRN: [id number]  HPI    Review of Systems     Objective:   Physical Exam        Assessment & Plan:
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
## 2013-04-03 ENCOUNTER — Other Ambulatory Visit: Payer: Self-pay | Admitting: Internal Medicine

## 2013-04-03 DIAGNOSIS — I8000 Phlebitis and thrombophlebitis of superficial vessels of unspecified lower extremity: Secondary | ICD-10-CM

## 2013-04-04 ENCOUNTER — Other Ambulatory Visit (HOSPITAL_COMMUNITY): Payer: BC Managed Care – PPO

## 2013-04-04 ENCOUNTER — Encounter: Payer: BC Managed Care – PPO | Admitting: Vascular Surgery

## 2013-04-05 ENCOUNTER — Ambulatory Visit (HOSPITAL_COMMUNITY): Payer: BC Managed Care – PPO

## 2013-04-11 ENCOUNTER — Encounter: Payer: Self-pay | Admitting: Vascular Surgery

## 2013-04-11 ENCOUNTER — Ambulatory Visit (HOSPITAL_COMMUNITY)
Admission: RE | Admit: 2013-04-11 | Discharge: 2013-04-11 | Disposition: A | Discharge status: Home / Self Care | Payer: BC Managed Care – PPO | Source: Ambulatory Visit | Attending: Vascular Surgery | Admitting: Vascular Surgery

## 2013-04-11 ENCOUNTER — Ambulatory Visit (INDEPENDENT_AMBULATORY_CARE_PROVIDER_SITE_OTHER): Payer: BC Managed Care – PPO | Admitting: Vascular Surgery

## 2013-04-11 VITALS — BP 128/96 | HR 82 | Resp 18 | Ht 73.0 in | Wt 248.2 lb

## 2013-04-11 DIAGNOSIS — I8000 Phlebitis and thrombophlebitis of superficial vessels of unspecified lower extremity: Secondary | ICD-10-CM

## 2013-04-11 DIAGNOSIS — I8390 Asymptomatic varicose veins of unspecified lower extremity: Secondary | ICD-10-CM | POA: Insufficient documentation

## 2013-04-11 DIAGNOSIS — I82819 Embolism and thrombosis of superficial veins of unspecified lower extremities: Secondary | ICD-10-CM | POA: Insufficient documentation

## 2013-04-11 DIAGNOSIS — Z7901 Long term (current) use of anticoagulants: Secondary | ICD-10-CM | POA: Insufficient documentation

## 2013-04-11 DIAGNOSIS — I809 Phlebitis and thrombophlebitis of unspecified site: Secondary | ICD-10-CM
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
## 2013-04-16 ENCOUNTER — Ambulatory Visit (INDEPENDENT_AMBULATORY_CARE_PROVIDER_SITE_OTHER): Payer: BC Managed Care – PPO | Admitting: Internal Medicine

## 2013-04-16 VITALS — BP 132/82 | HR 101 | Temp 98.0°F | Resp 16 | Ht 71.5 in | Wt 242.0 lb

## 2013-04-16 DIAGNOSIS — M25519 Pain in unspecified shoulder: Secondary | ICD-10-CM

## 2013-04-16 DIAGNOSIS — M79609 Pain in unspecified limb: Secondary | ICD-10-CM

## 2013-04-16 DIAGNOSIS — S63602A Unspecified sprain of left thumb, initial encounter: Secondary | ICD-10-CM

## 2013-04-16 DIAGNOSIS — M79604 Pain in right leg: Secondary | ICD-10-CM

## 2013-04-16 DIAGNOSIS — S6390XA Sprain of unspecified part of unspecified wrist and hand, initial encounter: Secondary | ICD-10-CM

## 2013-04-16 DIAGNOSIS — G4700 Insomnia, unspecified: Secondary | ICD-10-CM

## 2013-04-16 MED ORDER — OXYCODONE-ACETAMINOPHEN 10-325 MG PO TABS: 1.0000 | ORAL_TABLET | Freq: Three times a day (TID) | ORAL | Status: DC | PRN

## 2013-04-16 MED ORDER — MELOXICAM 15 MG PO TABS: 15.0000 mg | ORAL_TABLET | Freq: Every day | ORAL | Status: DC

## 2013-04-16 MED ORDER — CYCLOBENZAPRINE HCL 10 MG PO TABS: 10.0000 mg | ORAL_TABLET | Freq: Every day | ORAL | Status: DC
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
## 2013-04-25 DIAGNOSIS — Z0279 Encounter for issue of other medical certificate: Secondary | ICD-10-CM

## 2013-05-03 DIAGNOSIS — Z0271 Encounter for disability determination: Secondary | ICD-10-CM

## 2013-05-09 ENCOUNTER — Other Ambulatory Visit (HOSPITAL_COMMUNITY): Payer: BC Managed Care – PPO

## 2013-05-09 ENCOUNTER — Encounter: Payer: BC Managed Care – PPO | Admitting: Vascular Surgery

## 2016-02-19 ENCOUNTER — Telehealth (INDEPENDENT_AMBULATORY_CARE_PROVIDER_SITE_OTHER): Payer: Self-pay | Admitting: Orthopedic Surgery

## 2016-02-19 NOTE — Telephone Encounter (Signed)
[facility] @ [address] called needing dates for a deposition with Dr Marlou Sa. I told her I would check the schedule and email her some dates.  I e-mailed her possible dates and we will correspond via email on date to schedule.  I did inform her of $prepayment

## 2016-02-27 ENCOUNTER — Encounter: Payer: Self-pay | Admitting: Vascular Surgery

## 2018-11-01 ENCOUNTER — Ambulatory Visit (INDEPENDENT_AMBULATORY_CARE_PROVIDER_SITE_OTHER): Payer: BC Managed Care – PPO | Admitting: Family Medicine

## 2018-11-01 ENCOUNTER — Encounter: Payer: Self-pay | Admitting: Family Medicine

## 2018-11-01 ENCOUNTER — Ambulatory Visit: Payer: Self-pay

## 2018-11-01 DIAGNOSIS — M545 Low back pain, unspecified: Secondary | ICD-10-CM

## 2018-11-01 MED ORDER — METHYLPREDNISOLONE 4 MG PO TBPK: ORAL_TABLET | ORAL | 0 refills | Status: DC

## 2018-11-01 MED ORDER — BACLOFEN 10 MG PO TABS: 5.0000 mg | ORAL_TABLET | Freq: Three times a day (TID) | ORAL | 1 refills | Status: DC | PRN

## 2018-11-01 MED ORDER — HYDROCODONE-ACETAMINOPHEN 5-325 MG PO TABS: 1.0000 | ORAL_TABLET | Freq: Four times a day (QID) | ORAL | 0 refills | Status: DC | PRN
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
## 2018-11-06 ENCOUNTER — Telehealth: Payer: Self-pay | Admitting: Family Medicine

## 2018-11-06 NOTE — Telephone Encounter (Signed)
Deidre called back concerned that we would not get back with her today. She states that patient's symptoms seem to be getting worse. He was up going to the bathroom and got a terrible back spasm and fell to the floor. She states patient is taking medications that were prescribed to him, but they do not seem to be helping. She states that he is scheduled for a MRI, but she thinks something else needs to be done. MRI is scheduled for 11/08/2018 per chart.  I advised someone would call her today.

## 2018-11-06 NOTE — Telephone Encounter (Signed)
Received call from [name]. Patient gave verbal permission to speak to her. She advised patient is much worse and fell going to the bathroom. [name] said the pain is much worse. The number to contact [name] is [phone]

## 2018-11-06 NOTE — Telephone Encounter (Signed)
I called and spoke with patient and girlfriend on speaker phone. Patient is now laying flat on back due to spasms whenever he moves. I advised per Dr. Junius Roads, if having this much difficulty, he needs to go to the ED to be evaluated. Patient expressed understanding.

## 2018-11-06 NOTE — Telephone Encounter (Signed)
Per Dr. Junius Roads, there is nothing more to offer here beyond what has already been ordered and given. He needs to go to the ER if he is hurting that badly.

## 2018-11-08 ENCOUNTER — Ambulatory Visit
Admission: RE | Admit: 2018-11-08 | Discharge: 2018-11-08 | Disposition: A | Discharge status: Home / Self Care | Payer: BC Managed Care – PPO | Source: Ambulatory Visit | Attending: Family Medicine | Admitting: Family Medicine

## 2018-11-08 ENCOUNTER — Other Ambulatory Visit: Payer: Self-pay

## 2018-11-08 DIAGNOSIS — M545 Low back pain, unspecified: Secondary | ICD-10-CM

## 2018-11-09 ENCOUNTER — Telehealth: Payer: Self-pay | Admitting: Family Medicine

## 2018-11-09 ENCOUNTER — Other Ambulatory Visit: Payer: Self-pay

## 2018-11-09 MED ORDER — DICLOFENAC SODIUM 75 MG PO TBEC: 75.0000 mg | DELAYED_RELEASE_TABLET | Freq: Two times a day (BID) | ORAL | 3 refills | Status: DC | PRN

## 2018-11-09 NOTE — Telephone Encounter (Signed)
I called the patient with the results. He is experiencing a lot of pain. He states that his back "locks up" when he tries to move. This pain has worsened since he was helped up off the MRI table yesterday - " they snatched me up." The muscle relaxer is helping (Baclofen).  The Norco does not help - he stated it is "not that type of pain." He finished the medrol dosepack. Should he be taking an antiinflammatory again? He is anxious to get back to work, but cannot move well at all without pain. Please advise.

## 2018-11-09 NOTE — Telephone Encounter (Signed)
MRI shows old-appearing T12 and L1 compression fractures.  Bulging disc at L4-5 is near the left-sided nerve opening but not causing any nerve compression.

## 2018-11-09 NOTE — Telephone Encounter (Signed)
I notified the patient of the plan. I called in the diclofenac to [facility] on [address]. He said he plans to return to Dr. Noberto Retort to start another regimen, once the inflammation has subsided enough for him to move around better. His current work note allows him to return to work on 11/13/18 - he will let us know if this needs to be revised due to his condition.

## 2018-11-09 NOTE — Telephone Encounter (Signed)
I assume he is still seeing Dr. Noberto Retort. If not, then I recommend starting again.  Would you please call in diclofenac 75 mg Bid prn, #60, 3 refills?  Thanks

## 2018-11-13 ENCOUNTER — Telehealth: Payer: Self-pay

## 2018-11-13 NOTE — Telephone Encounter (Signed)
New letter emailed to the patient and a copy is left up front for pickup. The patient said he would like to return to light duty sooner than 2 weeks if he is feeling well enough - see letter.

## 2018-11-13 NOTE — Telephone Encounter (Signed)
Patient here now requesting extended work note that expires on today from previous work note on 11/01/18

## 2018-11-13 NOTE — Telephone Encounter (Signed)
Ok to keep him out for 2 more weeks.

## 2018-11-13 NOTE — Telephone Encounter (Signed)
The patient saw Dr. Noberto Retort today and received treatment. He says the medications he is on now are helping to give him some sort of normalcy, but "I know it's the meds that are taking care of the pain." He said Dr. Noberto Retort is obtaining his MRI from [facility] and will formulate a regimen according to that. The patient does not feel like he can return to work yet. His original note had him out until today. He is requesting an extension for 2 more weeks while he continues chiropractic treatments.

## 2018-11-15 ENCOUNTER — Telehealth: Payer: Self-pay

## 2018-11-15 ENCOUNTER — Telehealth: Payer: Self-pay | Admitting: Family Medicine

## 2018-11-15 ENCOUNTER — Other Ambulatory Visit: Payer: Self-pay | Admitting: Family Medicine

## 2018-11-15 MED ORDER — ONDANSETRON HCL 4 MG PO TABS: 4.0000 mg | ORAL_TABLET | Freq: Three times a day (TID) | ORAL | 0 refills | Status: DC | PRN

## 2018-11-15 NOTE — Telephone Encounter (Signed)
I called and spoke with the patient's girlfriend - see the other messages on this from today.

## 2018-11-15 NOTE — Telephone Encounter (Signed)
I called and spoke with [name] (patient's girlfriend - patient was in the background): The diclofenac is actually helping, but he is having nausea with dry heaves. She had a bottle of ondansetron that she never finished - she gave him this and it helps. Dr. Junius Roads sent in a Rx for this for the patient today. She asked if there was anything else the patient could try to eat that would not be hard on his stomach - has only been eating apple sauce and bananas. I advised her that he could try crackers, toast, plain rice or plain pasta. Continue the apple sauce and bananas. May eat more/different foods as tolerated.

## 2018-11-15 NOTE — Telephone Encounter (Signed)
Derek [name] pt girlfriend called in wanting to speak to terri or dr.hilts is very upset that she hasnt gotten a call since yesterday.   Please give her a call [phone]

## 2018-11-15 NOTE — Telephone Encounter (Signed)
Patient's friend called stating that the patient needs a Rx sent to his pharmacy for nausea.  CB# is [phone] or [phone].  Please advise.  Thank you.

## 2018-11-15 NOTE — Telephone Encounter (Signed)
Please advise 

## 2018-11-15 NOTE — Telephone Encounter (Signed)
Received cal from [employer] asking for a call back concerning patient getting nauseated from the new medication he is taking. [name] said she asked for a call back yesterday and have not had a call back yet. [name] said patient only had apple sauce and a banana today. The number to contact [name] is [phone]   Please see previous message.

## 2018-11-15 NOTE — Telephone Encounter (Signed)
I sent a message earlier about a request for nausea medication. This message just has a little more detail about that.

## 2018-11-15 NOTE — Telephone Encounter (Signed)
Rx sent 

## 2018-11-17 ENCOUNTER — Encounter: Payer: Self-pay | Admitting: Family Medicine

## 2018-11-17 ENCOUNTER — Ambulatory Visit: Payer: Self-pay

## 2018-11-17 ENCOUNTER — Ambulatory Visit (INDEPENDENT_AMBULATORY_CARE_PROVIDER_SITE_OTHER): Payer: BC Managed Care – PPO | Admitting: Family Medicine

## 2018-11-17 DIAGNOSIS — R0781 Pleurodynia: Secondary | ICD-10-CM

## 2018-11-17 MED ORDER — NABUMETONE 750 MG PO TABS: 750.0000 mg | ORAL_TABLET | Freq: Two times a day (BID) | ORAL | 6 refills | Status: DC | PRN

## 2018-11-17 MED ORDER — ONDANSETRON HCL 4 MG PO TABS: 4.0000 mg | ORAL_TABLET | Freq: Three times a day (TID) | ORAL | 2 refills | Status: DC | PRN

## 2018-11-17 NOTE — Progress Notes (Signed)
   Office Visit Note   Patient: Derek [name]           Date of Birth: 08-Jun-1975           MRN: [id number] Visit Date: 11/17/2018 Requested by: Posey Boyer, MD [address] [place],  [place] [address] PCP: Posey Boyer, MD  Subjective: Chief Complaint  Patient presents with  . Lower Back - Pain    Back pain and spasms. Been seeing Dr. Noberto Retort. Can hardly move today, due to pain.    HPI: He is here with persistent back pain.  He states that his lumbar pain is actually improving, but he has had ongoing spasms and severe pain in the left posterior rib cage area.  It hurts to take a deep breath, it hurts to twist.  He is in excruciating pain in his medication is causing nausea and decreased appetite.              ROS: No fevers or chills.  All other systems were reviewed and are negative.  Objective: Vital Signs: There were no vitals taken for this visit.  Physical Exam:  General:  Alert and oriented, in no acute distress. Pulm:  Breathing unlabored. Psy:  Normal mood, congruent affect. Skin: No rash on the skin. Left rib cage: He is exquisitely tender in the lower posterior left rib cage.  Tender directly over the bone with no crepitation or step-off.  Possibly a myofascial trigger point there as well.  Imaging: X-rays left ribs: No definite fracture seen.  No sign of pneumothorax.    Assessment & Plan: 1.  Left lower thoracic pain, possibly myofascial but cannot completely rule out occult rib fracture. -We will try Relafen instead of diclofenac to see if it helps without causing GI upset.  Baclofen as needed.  If severe pain persist, consider CT scan of the ribs.     Procedures: No procedures performed  No notes on file     PMFS History: Patient Active Problem List   Diagnosis Date Noted  . 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 BMI 33.0-33.9,adult 03/27/2013   Past Medical History:  Diagnosis Date  . Depression     Family History  Problem Relation Age of  Onset  . Diabetes Maternal Grandmother     History reviewed. No pertinent surgical history. Social History   Occupational History  . Not on file  Tobacco Use  . Smoking status: Former Smoker    Types: Cigarettes    Quit date: 03/22/2005    Years since quitting: 13.6  . Smokeless tobacco: Never Used  Substance and Sexual Activity  . Alcohol use: No  . Drug use: No  . Sexual activity: Not on file

## 2018-11-20 ENCOUNTER — Encounter (HOSPITAL_COMMUNITY): Payer: Self-pay | Admitting: Emergency Medicine

## 2018-11-20 ENCOUNTER — Telehealth: Payer: Self-pay

## 2018-11-20 ENCOUNTER — Inpatient Hospital Stay (HOSPITAL_COMMUNITY)
Admission: EM | Admit: 2018-11-20 | Discharge: 2018-12-04 | DRG: 824 | Disposition: A | Discharge status: Home / Self Care | Payer: BC Managed Care – PPO | Attending: Internal Medicine | Admitting: Internal Medicine

## 2018-11-20 DIAGNOSIS — M62830 Muscle spasm of back: Secondary | ICD-10-CM | POA: Diagnosis not present

## 2018-11-20 DIAGNOSIS — E875 Hyperkalemia: Secondary | ICD-10-CM | POA: Diagnosis present

## 2018-11-20 DIAGNOSIS — M545 Low back pain, unspecified: Secondary | ICD-10-CM | POA: Diagnosis present

## 2018-11-20 DIAGNOSIS — I10 Essential (primary) hypertension: Secondary | ICD-10-CM | POA: Diagnosis present

## 2018-11-20 DIAGNOSIS — Z20828 Contact with and (suspected) exposure to other viral communicable diseases: Secondary | ICD-10-CM | POA: Diagnosis present

## 2018-11-20 DIAGNOSIS — Z886 Allergy status to analgesic agent status: Secondary | ICD-10-CM

## 2018-11-20 DIAGNOSIS — Z91018 Allergy to other foods: Secondary | ICD-10-CM

## 2018-11-20 DIAGNOSIS — Z87891 Personal history of nicotine dependence: Secondary | ICD-10-CM

## 2018-11-20 DIAGNOSIS — M8448XA Pathological fracture, other site, initial encounter for fracture: Secondary | ICD-10-CM | POA: Diagnosis present

## 2018-11-20 DIAGNOSIS — D630 Anemia in neoplastic disease: Secondary | ICD-10-CM | POA: Diagnosis present

## 2018-11-20 DIAGNOSIS — F419 Anxiety disorder, unspecified: Secondary | ICD-10-CM | POA: Diagnosis not present

## 2018-11-20 DIAGNOSIS — T39395A Adverse effect of other nonsteroidal anti-inflammatory drugs [NSAID], initial encounter: Secondary | ICD-10-CM | POA: Diagnosis present

## 2018-11-20 DIAGNOSIS — E874 Mixed disorder of acid-base balance: Secondary | ICD-10-CM | POA: Diagnosis present

## 2018-11-20 DIAGNOSIS — E871 Hypo-osmolality and hyponatremia: Secondary | ICD-10-CM | POA: Diagnosis not present

## 2018-11-20 DIAGNOSIS — N179 Acute kidney failure, unspecified: Secondary | ICD-10-CM | POA: Diagnosis present

## 2018-11-20 DIAGNOSIS — R509 Fever, unspecified: Secondary | ICD-10-CM

## 2018-11-20 DIAGNOSIS — C9000 Multiple myeloma not having achieved remission: Secondary | ICD-10-CM | POA: Diagnosis not present

## 2018-11-20 DIAGNOSIS — Z86718 Personal history of other venous thrombosis and embolism: Secondary | ICD-10-CM

## 2018-11-20 DIAGNOSIS — E860 Dehydration: Secondary | ICD-10-CM | POA: Diagnosis present

## 2018-11-20 DIAGNOSIS — Z79899 Other long term (current) drug therapy: Secondary | ICD-10-CM

## 2018-11-20 LAB — URINALYSIS, ROUTINE W REFLEX MICROSCOPIC
Bilirubin Urine: NEGATIVE
Glucose, UA: NEGATIVE mg/dL
Ketones, ur: NEGATIVE mg/dL
Leukocytes,Ua: NEGATIVE
Nitrite: NEGATIVE
Protein, ur: 100 mg/dL — AB
Specific Gravity, Urine: 1.013 (ref 1.005–1.030)
pH: 5 (ref 5.0–8.0)

## 2018-11-20 LAB — CBC WITH DIFFERENTIAL/PLATELET
Abs Immature Granulocytes: 0.23 10*3/uL — ABNORMAL HIGH (ref 0.00–0.07)
Basophils Absolute: 0.1 10*3/uL (ref 0.0–0.1)
Basophils Relative: 1 %
Eosinophils Absolute: 0.2 10*3/uL (ref 0.0–0.5)
Eosinophils Relative: 2 %
HCT: 38.8 % — ABNORMAL LOW (ref 39.0–52.0)
Hemoglobin: 13.4 g/dL (ref 13.0–17.0)
Immature Granulocytes: 2 %
Lymphocytes Relative: 13 %
Lymphs Abs: 1.8 10*3/uL (ref 0.7–4.0)
MCH: 28.1 pg (ref 26.0–34.0)
MCHC: 34.5 g/dL (ref 30.0–36.0)
MCV: 81.3 fL (ref 80.0–100.0)
Monocytes Absolute: 0.9 10*3/uL (ref 0.1–1.0)
Monocytes Relative: 6 %
Neutro Abs: 10.9 10*3/uL — ABNORMAL HIGH (ref 1.7–7.7)
Neutrophils Relative %: 76 %
Platelets: 457 10*3/uL — ABNORMAL HIGH (ref 150–400)
RBC: 4.77 MIL/uL (ref 4.22–5.81)
RDW: 12.6 % (ref 11.5–15.5)
WBC: 14.1 10*3/uL — ABNORMAL HIGH (ref 4.0–10.5)
nRBC: 0 % (ref 0.0–0.2)

## 2018-11-20 MED ORDER — ONDANSETRON 8 MG PO TBDP
8.0000 mg | ORAL_TABLET | Freq: Once | ORAL | Status: AC
  Administered 2018-11-20: 23:00:00 8 mg via ORAL
  Filled 2018-11-20: qty 1

## 2018-11-20 MED ORDER — OXYCODONE-ACETAMINOPHEN 5-325 MG PO TABS
1.0000 | ORAL_TABLET | Freq: Once | ORAL | Status: AC
  Administered 2018-11-20: 1 via ORAL
  Filled 2018-11-20: qty 1

## 2018-11-20 MED ORDER — SODIUM CHLORIDE 0.9 % IV BOLUS: 1000.0000 mL | Freq: Once | INTRAVENOUS | Status: DC

## 2018-11-20 MED ORDER — ONDANSETRON HCL 4 MG/2ML IJ SOLN
4.0000 mg | Freq: Once | INTRAMUSCULAR | Status: DC
  Filled 2018-11-20: qty 2
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
## 2018-11-20 NOTE — Telephone Encounter (Signed)
Patients girlfriend [name] called stating patient is having severe stomach issues, extreme pain, nausea, dry heaves, over heating and spasms. I advised after speaking with Dr Junius Roads should proceed to ER for further treatment.

## 2018-11-20 NOTE — ED Provider Notes (Signed)
[facility] DEPT Provider Note   CSN: [id number] Arrival date & time: 11/20/18  2014     History   Chief Complaint Chief Complaint  Patient presents with  . Abdominal Pain    sides of stomach and lower back   . Back Pain    HPI Derek [name] is a 43 y.o. male.     HPI Pt states he is having trouble with abdominal pain, gas problems.   He first started having some nausea after taking baclofen, steroids and vicodin for his back pain.  His symptoms continued.  He had an outpatient MRI for his back.   He was then started on diclofenac.  He took that for several days.  He then started having more irritation since then.  He continues to have nausea and gas.  He gets nauseated with eating or drinking as well as moving quickly.    He will feel overheated and start to dry heave.  That starts to irritate his back.  Past Medical History:  Diagnosis Date  . Depression     Patient Active Problem List   Diagnosis Date Noted  . 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 BMI 33.0-33.9,adult 03/27/2013    History reviewed. No pertinent surgical history.      Home Medications    Prior to Admission medications   Medication Sig Start Date End Date Taking? Authorizing Provider  baclofen (LIORESAL) 10 MG tablet Take 0.5-1 tablets (5-10 mg total) by mouth 3 (three) times daily as needed for muscle spasms. 11/01/18  Yes Hilts, Legrand Como, MD  Biotin 1000 MCG CHEW Chew 1,000 mg by mouth daily.    Yes [provider]  ibuprofen (ADVIL) 200 MG tablet Take 200 mg by mouth every 6 (six) hours as needed.   Yes [provider]  Multiple Vitamin (MULTIVITAMIN WITH MINERALS) TABS tablet Take 1 tablet by mouth daily.   Yes [provider]  nabumetone (RELAFEN) 750 MG tablet Take 1 tablet (750 mg total) by mouth 2 (two) times daily as needed. 11/17/18  Yes Hilts, Legrand Como, MD  ondansetron (ZOFRAN) 4 MG tablet Take 1 tablet (4 mg total) by mouth every 8 (eight)  hours as needed for nausea or vomiting. 11/17/18  Yes Hilts, Legrand Como, MD  HYDROcodone-acetaminophen (NORCO/VICODIN) 5-325 MG tablet Take 1 tablet by mouth every 6 (six) hours as needed for moderate pain. Patient not taking: Reported on 11/20/2018 11/01/18   Hilts, Michael, MD  Magnesium Salicylate (DOANS PILLS PO) Take by mouth.    [provider]  methylPREDNISolone (MEDROL DOSEPAK) 4 MG TBPK tablet As directed for 6 days. Patient not taking: Reported on 11/20/2018 11/01/18   Hilts, Legrand Como, MD    Family History Family History  Problem Relation Age of Onset  . Diabetes Maternal Grandmother     Social History Social History   Tobacco Use  . Smoking status: Former Smoker    Types: Cigarettes    Quit date: 03/22/2005    Years since quitting: 13.6  . Smokeless tobacco: Never Used  Substance Use Topics  . Alcohol use: No  . Drug use: No     Allergies   Aspirin   Review of Systems Review of Systems  Gastrointestinal: Positive for nausea. Negative for diarrhea and vomiting.  Genitourinary: Negative for dysuria.  All other systems reviewed and are negative.    Physical Exam Updated Vital Signs BP (!) 147/89 (BP Location: Left Arm)   Pulse 72   Temp 97.9 F (36.6 C) (Oral)  Resp 17   SpO2 100%   Physical Exam Vitals signs and nursing note reviewed.  Constitutional:      General: He is not in acute distress.    Appearance: He is well-developed.  HENT:     Head: Normocephalic and atraumatic.     Right Ear: External ear normal.     Left Ear: External ear normal.  Eyes:     General: No scleral icterus.       Right eye: No discharge.        Left eye: No discharge.     Conjunctiva/sclera: Conjunctivae normal.  Neck:     Musculoskeletal: Neck supple.     Trachea: No tracheal deviation.  Cardiovascular:     Rate and Rhythm: Normal rate and regular rhythm.  Pulmonary:     Effort: Pulmonary effort is normal. No respiratory distress.     Breath sounds: Normal  breath sounds. No stridor. No wheezing or rales.  Abdominal:     General: Bowel sounds are normal. There is no distension.     Palpations: Abdomen is soft.     Tenderness: There is no abdominal tenderness. There is no right CVA tenderness, left CVA tenderness or rebound.  Musculoskeletal:        General: No tenderness.  Skin:    General: Skin is warm and dry.     Findings: No rash.  Neurological:     Mental Status: He is alert.     Cranial Nerves: No cranial nerve deficit (no facial droop, extraocular movements intact, no slurred speech).     Sensory: No sensory deficit.     Motor: No abnormal muscle tone or seizure activity.     Coordination: Coordination normal.     Comments: 5 out of 5 strength bilateral lower extremities, normal sensation      ED Treatments / Results  Labs (all labs ordered are listed, but only abnormal results are displayed) Labs Reviewed  CBC WITH DIFFERENTIAL/PLATELET - Abnormal; Notable for the following components:      Result Value   WBC 14.1 (*)    HCT 38.8 (*)    Platelets 457 (*)    Neutro Abs 10.9 (*)    Abs Immature Granulocytes 0.23 (*)    All other components within normal limits  COMPREHENSIVE METABOLIC PANEL - Abnormal; Notable for the following components:   Sodium 133 (*)    Chloride 94 (*)    CO2 21 (*)    Glucose, Bld 113 (*)    BUN 123 (*)    Creatinine, Ser 10.85 (*)    Calcium 13.6 (*)    Total Protein 8.4 (*)    GFR calc non Af Amer 5 (*)    GFR calc Af Amer 6 (*)    Anion gap 18 (*)    All other components within normal limits  LIPASE, BLOOD - Abnormal; Notable for the following components:   Lipase 58 (*)    All other components within normal limits  URINALYSIS, ROUTINE W REFLEX MICROSCOPIC - Abnormal; Notable for the following components:   Color, Urine STRAW (*)    Hgb urine dipstick MODERATE (*)    Protein, ur 100 (*)    Bacteria, UA RARE (*)    All other components within normal limits  SARS CORONAVIRUS 2 (TAT  6-24 HRS)    EKG None  Radiology No results found.  Procedures .Critical Care Performed by: Dorie Rank, MD Authorized by: Dorie Rank, MD   Critical care provider  statement:    Critical care time (minutes):  45   Critical care was time spent personally by me on the following activities:  Discussions with consultants, evaluation of patient's response to treatment, examination of patient, ordering and performing treatments and interventions, ordering and review of laboratory studies, ordering and review of radiographic studies, pulse oximetry, re-evaluation of patient's condition, obtaining history from patient or surrogate and review of old charts   (including critical care time)  Medications Ordered in ED Medications  ondansetron (ZOFRAN) injection 4 mg (4 mg Intravenous Not Given 11/20/18 2304)  sodium chloride 0.9 % bolus 1,000 mL (1,000 mLs Intravenous Not Given 11/20/18 2304)  sodium chloride 0.9 % bolus 1,000 mL (has no administration in time range)    Followed by  0.9 %  sodium chloride infusion (has no administration in time range)  ondansetron (ZOFRAN-ODT) disintegrating tablet 8 mg (8 mg Oral Given 11/20/18 2313)  oxyCODONE-acetaminophen (PERCOCET/ROXICET) 5-325 MG per tablet 1 tablet (1 tablet Oral Given 11/20/18 2344)     Initial Impression / Assessment and Plan / ED Course  I have reviewed the triage vital signs and the nursing notes.  Pertinent labs & imaging results that were available during my care of the patient were reviewed by me and considered in my medical decision making (see chart for details).  Clinical Course as of Nov 21 35  Mon Nov 20, 2018  2219 MRI result 8/ 2020: IMPRESSION: 1. Disc bulge at L4-5 causing borderline left subarticular lateral recess stenosis but without overt impingement. 2. 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   [JK]  2313 Patient decided he does not want IV fluids.  He would just like a Zofran ODT    [JK]  Tue Nov 21, 2018  0002 Discussed doing a CT scan to assess for ureteral stone considering the patient's moderate hemoglobin.  Patient states he does not want to have that done   [JK]    Clinical Course User Index [JK] Dorie Rank, MD     Patient presented to the ED for evaluation of back pain and persistent nausea.  Discussed giving IV fluids as well as doing a CT scan.  Patient does not want to have that done.  He did have a recent MRI that does not demonstrate any findings or require any acute neurosurgical intervention.  Patient may be having some GI upset from his medications.  I cannot exclude the possibility of a ureteral stone but the patient does not want to have a CT scan.  C-Met and lipase are pending.  Patient's metabolic panel returned.  He is in acute renal failure with hypercalcemia.  Patient admits to supplemental calcium but is only taking 1 pill daily.  I doubt this would be the culprit.  He did have recent x-ray imaging and there is no mention of him having any lytic lesions but some type of occult malignancy would be a concern.  Hyperparathyroidism is another possibility.  Patient will need to be admitted to the hospital for further treatment.  I have ordered IV hydration for his hypercalcemia and renal failure.  I have ordered a CT scan to evaluate for any mass or obstructive process.   Final Clinical Impressions(s) / ED Diagnoses   Final diagnoses:  Hypercalcemia  Acute renal failure, unspecified acute renal failure type [facility])      Dorie Rank, MD 11/21/18 [phone]

## 2018-11-21 ENCOUNTER — Emergency Department (HOSPITAL_COMMUNITY): Payer: BC Managed Care – PPO

## 2018-11-21 ENCOUNTER — Encounter (HOSPITAL_COMMUNITY): Payer: Self-pay | Admitting: Internal Medicine

## 2018-11-21 ENCOUNTER — Other Ambulatory Visit: Payer: Self-pay

## 2018-11-21 DIAGNOSIS — M545 Low back pain, unspecified: Secondary | ICD-10-CM | POA: Diagnosis present

## 2018-11-21 DIAGNOSIS — T39395A Adverse effect of other nonsteroidal anti-inflammatory drugs [NSAID], initial encounter: Secondary | ICD-10-CM | POA: Diagnosis present

## 2018-11-21 DIAGNOSIS — C9000 Multiple myeloma not having achieved remission: Secondary | ICD-10-CM | POA: Diagnosis present

## 2018-11-21 DIAGNOSIS — E874 Mixed disorder of acid-base balance: Secondary | ICD-10-CM | POA: Diagnosis present

## 2018-11-21 DIAGNOSIS — Z91018 Allergy to other foods: Secondary | ICD-10-CM | POA: Diagnosis not present

## 2018-11-21 DIAGNOSIS — Z886 Allergy status to analgesic agent status: Secondary | ICD-10-CM | POA: Diagnosis not present

## 2018-11-21 DIAGNOSIS — E875 Hyperkalemia: Secondary | ICD-10-CM | POA: Diagnosis present

## 2018-11-21 DIAGNOSIS — Z87891 Personal history of nicotine dependence: Secondary | ICD-10-CM | POA: Diagnosis not present

## 2018-11-21 DIAGNOSIS — Z20828 Contact with and (suspected) exposure to other viral communicable diseases: Secondary | ICD-10-CM | POA: Diagnosis present

## 2018-11-21 DIAGNOSIS — D630 Anemia in neoplastic disease: Secondary | ICD-10-CM | POA: Diagnosis present

## 2018-11-21 DIAGNOSIS — F419 Anxiety disorder, unspecified: Secondary | ICD-10-CM | POA: Diagnosis not present

## 2018-11-21 DIAGNOSIS — E871 Hypo-osmolality and hyponatremia: Secondary | ICD-10-CM | POA: Diagnosis not present

## 2018-11-21 DIAGNOSIS — N178 Other acute kidney failure: Secondary | ICD-10-CM | POA: Diagnosis not present

## 2018-11-21 DIAGNOSIS — Z86718 Personal history of other venous thrombosis and embolism: Secondary | ICD-10-CM | POA: Diagnosis not present

## 2018-11-21 DIAGNOSIS — M62830 Muscle spasm of back: Secondary | ICD-10-CM | POA: Diagnosis not present

## 2018-11-21 DIAGNOSIS — N179 Acute kidney failure, unspecified: Secondary | ICD-10-CM | POA: Diagnosis present

## 2018-11-21 DIAGNOSIS — E860 Dehydration: Secondary | ICD-10-CM | POA: Diagnosis present

## 2018-11-21 DIAGNOSIS — Z79899 Other long term (current) drug therapy: Secondary | ICD-10-CM | POA: Diagnosis not present

## 2018-11-21 DIAGNOSIS — I10 Essential (primary) hypertension: Secondary | ICD-10-CM | POA: Diagnosis present

## 2018-11-21 DIAGNOSIS — M8448XA Pathological fracture, other site, initial encounter for fracture: Secondary | ICD-10-CM | POA: Diagnosis present

## 2018-11-21 DIAGNOSIS — M899 Disorder of bone, unspecified: Secondary | ICD-10-CM | POA: Diagnosis not present

## 2018-11-21 LAB — COMPREHENSIVE METABOLIC PANEL
ALT: 32 U/L (ref 0–44)
ALT: 30 U/L (ref 0–44)
AST: 16 U/L (ref 15–41)
AST: 17 U/L (ref 15–41)
Albumin: 4.4 g/dL (ref 3.5–5.0)
Albumin: 4 g/dL (ref 3.5–5.0)
Alkaline Phosphatase: 84 U/L (ref 38–126)
Alkaline Phosphatase: 82 U/L (ref 38–126)
Anion gap: 18 — ABNORMAL HIGH (ref 5–15)
Anion gap: 19 — ABNORMAL HIGH (ref 5–15)
BUN: 123 mg/dL — ABNORMAL HIGH (ref 6–20)
BUN: 124 mg/dL — ABNORMAL HIGH (ref 6–20)
CO2: 21 mmol/L — ABNORMAL LOW (ref 22–32)
CO2: 19 mmol/L — ABNORMAL LOW (ref 22–32)
Calcium: 13.6 mg/dL (ref 8.9–10.3)
Calcium: 13.7 mg/dL (ref 8.9–10.3)
Chloride: 94 mmol/L — ABNORMAL LOW (ref 98–111)
Chloride: 92 mmol/L — ABNORMAL LOW (ref 98–111)
Creatinine, Ser: 10.85 mg/dL — ABNORMAL HIGH (ref 0.61–1.24)
Creatinine, Ser: 10.32 mg/dL — ABNORMAL HIGH (ref 0.61–1.24)
GFR calc Af Amer: 6 mL/min — ABNORMAL LOW (ref >60)
GFR calc Af Amer: 6 mL/min — ABNORMAL LOW (ref >60)
GFR calc non Af Amer: 5 mL/min — ABNORMAL LOW (ref >60)
GFR calc non Af Amer: 5 mL/min — ABNORMAL LOW (ref >60)
Glucose, Bld: 113 mg/dL — ABNORMAL HIGH (ref 70–99)
Glucose, Bld: 100 mg/dL — ABNORMAL HIGH (ref 70–99)
Potassium: 5.1 mmol/L (ref 3.5–5.1)
Potassium: 5 mmol/L (ref 3.5–5.1)
Sodium: 133 mmol/L — ABNORMAL LOW (ref 135–145)
Sodium: 130 mmol/L — ABNORMAL LOW (ref 135–145)
Total Bilirubin: 0.8 mg/dL (ref 0.3–1.2)
Total Bilirubin: 0.8 mg/dL (ref 0.3–1.2)
Total Protein: 8.4 g/dL — ABNORMAL HIGH (ref 6.5–8.1)
Total Protein: 8 g/dL (ref 6.5–8.1)

## 2018-11-21 LAB — BASIC METABOLIC PANEL
Anion gap: 16 — ABNORMAL HIGH (ref 5–15)
Anion gap: 12 (ref 5–15)
BUN: 116 mg/dL — ABNORMAL HIGH (ref 6–20)
BUN: 114 mg/dL — ABNORMAL HIGH (ref 6–20)
CO2: 21 mmol/L — ABNORMAL LOW (ref 22–32)
CO2: 22 mmol/L (ref 22–32)
Calcium: 12.8 mg/dL — ABNORMAL HIGH (ref 8.9–10.3)
Calcium: 12.7 mg/dL — ABNORMAL HIGH (ref 8.9–10.3)
Chloride: 92 mmol/L — ABNORMAL LOW (ref 98–111)
Chloride: 98 mmol/L (ref 98–111)
Creatinine, Ser: 10.39 mg/dL — ABNORMAL HIGH (ref 0.61–1.24)
Creatinine, Ser: 10.05 mg/dL — ABNORMAL HIGH (ref 0.61–1.24)
GFR calc Af Amer: 6 mL/min — ABNORMAL LOW (ref >60)
GFR calc Af Amer: 6 mL/min — ABNORMAL LOW (ref >60)
GFR calc non Af Amer: 5 mL/min — ABNORMAL LOW (ref >60)
GFR calc non Af Amer: 6 mL/min — ABNORMAL LOW (ref >60)
Glucose, Bld: 100 mg/dL — ABNORMAL HIGH (ref 70–99)
Glucose, Bld: 99 mg/dL (ref 70–99)
Potassium: 4.7 mmol/L (ref 3.5–5.1)
Potassium: 4.8 mmol/L (ref 3.5–5.1)
Sodium: 129 mmol/L — ABNORMAL LOW (ref 135–145)
Sodium: 132 mmol/L — ABNORMAL LOW (ref 135–145)

## 2018-11-21 LAB — PHOSPHORUS: Phosphorus: 8 mg/dL — ABNORMAL HIGH (ref 2.5–4.6)

## 2018-11-21 LAB — CREATININE, URINE, RANDOM: Creatinine, Urine: 118.19 mg/dL

## 2018-11-21 LAB — SODIUM, URINE, RANDOM: Sodium, Ur: 21 mmol/L

## 2018-11-21 LAB — LIPASE, BLOOD: Lipase: 58 U/L — ABNORMAL HIGH (ref 11–51)

## 2018-11-21 MED ORDER — SODIUM CHLORIDE 0.9 % IV BOLUS (SEPSIS): 1000.0000 mL | Freq: Once | INTRAVENOUS | Status: DC

## 2018-11-21 MED ORDER — ACETAMINOPHEN 325 MG PO TABS
650.0000 mg | ORAL_TABLET | Freq: Four times a day (QID) | ORAL | Status: DC | PRN
  Administered 2018-11-22 – 2018-11-24 (×4): 650 mg via ORAL
  Filled 2018-11-21 (×5): qty 2

## 2018-11-21 MED ORDER — ONDANSETRON HCL 4 MG PO TABS
4.0000 mg | ORAL_TABLET | Freq: Four times a day (QID) | ORAL | Status: DC | PRN
  Administered 2018-11-21 – 2018-11-22 (×2): 4 mg via ORAL
  Filled 2018-11-21 (×3): qty 1

## 2018-11-21 MED ORDER — SODIUM CHLORIDE 0.9 % IV SOLN
INTRAVENOUS | Status: DC
  Administered 2018-11-21 – 2018-11-25 (×14): via INTRAVENOUS

## 2018-11-21 MED ORDER — SODIUM CHLORIDE 0.9% FLUSH
10.0000 mL | INTRAVENOUS | Status: DC | PRN
  Administered 2018-11-23: 10 mL
  Filled 2018-11-21: qty 40

## 2018-11-21 MED ORDER — ONDANSETRON 4 MG PO TBDP
4.0000 mg | ORAL_TABLET | Freq: Once | ORAL | Status: AC
  Administered 2018-11-21: 4 mg via ORAL
  Filled 2018-11-21: qty 1

## 2018-11-21 MED ORDER — BACLOFEN 10 MG PO TABS
10.0000 mg | ORAL_TABLET | Freq: Three times a day (TID) | ORAL | Status: DC | PRN
  Administered 2018-11-22 – 2018-12-04 (×23): 10 mg via ORAL
  Filled 2018-11-21 (×27): qty 1

## 2018-11-21 MED ORDER — ACETAMINOPHEN 650 MG RE SUPP: 650.0000 mg | Freq: Four times a day (QID) | RECTAL | Status: DC | PRN

## 2018-11-21 MED ORDER — SODIUM CHLORIDE 0.9 % IV SOLN: 1000.0000 mL | INTRAVENOUS | Status: DC

## 2018-11-21 MED ORDER — ONDANSETRON HCL 4 MG/2ML IJ SOLN: 4.0000 mg | Freq: Four times a day (QID) | INTRAMUSCULAR | Status: DC | PRN

## 2018-11-21 NOTE — ED Notes (Signed)
This Probation officer spoke with MD Pokhrel per family request. The patient stated he goes into spasms when staff tries to take blood for lab samples. Family has refused COVID test due to the "spasms" patient goes into. This Probation officer has paged MD for further assistance.

## 2018-11-21 NOTE — ED Notes (Signed)
Pt in CT.

## 2018-11-21 NOTE — ED Notes (Signed)
Patient is requesting dissolvable tablet for Zofran.

## 2018-11-21 NOTE — ED Notes (Signed)
Comfort measures given  Hospital bed, po clear fluids/ water pitcher  Hospital fan, and drinks for visitor.

## 2018-11-21 NOTE — H&P (Addendum)
History and Physical    BLADIMIR YAPP [id number] DOB: 1976/02/02 DOA: 11/20/2018  PCP: Posey Boyer, MD  Patient coming from: Home.  Chief Complaint: Back pain abdominal discomfort.  HPI: Derek [name] is a 43 y.o. male with no significant past medical history who has been having increasing low back pain and has been following up with orthopedic surgeon was prescribed steroid Dosepak about 3 weeks ago as per the patient following which he was prescribed Voltaren for few days and also had an MRI which showed some disc bulge around L4-L5 and compression fracture around T12-L1.  Since pain was not improving was placed on nabumetone which patient took at least 3-4 doses.  Patient eventually started having some nausea.  Over the last 1 week because of nausea patient's appetite has decreased.  But still was able to drink some fluid and Gatorade.  Since patient's symptoms did not improve patient came to ER.  Denies any weakness of the lower extremity.  Pain is mostly in the low back.  ED Course: In the ER patient's labs show creatinine of 10.8 BUN 123 lipase 58 WBC count 14.1 total protein 8.4 calcium 13.6 albumin 4.4 UA shows 100 protein rare bacteria moderate hemoglobin but negative for RBC WBC.  CT renal study does not show anything acute in the abdominal pelvis area.  This shows marrow features concerning for metabolic changes.  No obstruction seen in the kidneys.  Patient is being admitted for acute renal failure with hypercalcemia.  Review of Systems: As per HPI, rest all negative.   Past Medical History:  Diagnosis Date   Depression     History reviewed. No pertinent surgical history.   reports that he quit smoking about 13 years ago. His smoking use included cigarettes. He has never used smokeless tobacco. He reports that he does not drink alcohol or use drugs.  Allergies  Allergen Reactions   Aspirin Hives and Anxiety    Family History  Problem Relation Age of Onset    Diabetes Maternal Grandmother     Prior to Admission medications   Medication Sig Start Date End Date Taking? Authorizing Provider  baclofen (LIORESAL) 10 MG tablet Take 0.5-1 tablets (5-10 mg total) by mouth 3 (three) times daily as needed for muscle spasms. 11/01/18  Yes Hilts, Legrand Como, MD  Biotin 1000 MCG CHEW Chew 1,000 mg by mouth daily.    Yes [provider]  ibuprofen (ADVIL) 200 MG tablet Take 200 mg by mouth every 6 (six) hours as needed.   Yes [provider]  Multiple Vitamin (MULTIVITAMIN WITH MINERALS) TABS tablet Take 1 tablet by mouth daily.   Yes [provider]  nabumetone (RELAFEN) 750 MG tablet Take 1 tablet (750 mg total) by mouth 2 (two) times daily as needed. 11/17/18  Yes Hilts, Legrand Como, MD  ondansetron (ZOFRAN) 4 MG tablet Take 1 tablet (4 mg total) by mouth every 8 (eight) hours as needed for nausea or vomiting. 11/17/18  Yes Hilts, Legrand Como, MD  HYDROcodone-acetaminophen (NORCO/VICODIN) 5-325 MG tablet Take 1 tablet by mouth every 6 (six) hours as needed for moderate pain. Patient not taking: Reported on 11/20/2018 11/01/18   Hilts, Michael, MD  Magnesium Salicylate (DOANS PILLS PO) Take by mouth.    [provider]  methylPREDNISolone (MEDROL DOSEPAK) 4 MG TBPK tablet As directed for 6 days. Patient not taking: Reported on 11/20/2018 11/01/18   Eunice Blase, MD    Physical Exam: Constitutional: Moderately built and nourished. Vitals:   11/20/18 2344  11/21/18 0306 11/21/18 0400 11/21/18 0412  BP: (!) 147/89 (!) 151/92 (!) 143/91 (!) 143/91  Pulse: 72 72  72  Resp: 17 18 10 10   Temp: 97.9 F (36.6 C)   97.9 F (36.6 C)  TempSrc: Oral   Oral  SpO2: 100% 99%  99%   Eyes: Anicteric no pallor. ENMT: No discharge from the ears eyes nose or mouth. Neck: No mass felt.  No neck rigidity. Respiratory: No rhonchi or crepitations. Cardiovascular: S1-S2 heard. Abdomen: Soft nontender bowel sounds present. Musculoskeletal: No  edema. Skin: No rash. Neurologic: Alert awake oriented to time place and person.  Moves all extremities. Psychiatric: Appears normal per normal affect.   Labs on Admission: I have personally reviewed following labs and imaging studies  CBC: Recent Labs  Lab 11/20/18 2325  WBC 14.1*  NEUTROABS 10.9*  HGB 13.4  HCT 38.8*  MCV 81.3  PLT [id number]*   Basic Metabolic Panel: Recent Labs  Lab 11/20/18 2325  NA 133*  K 5.1  CL 94*  CO2 21*  GLUCOSE 113*  BUN 123*  CREATININE 10.85*  CALCIUM 13.6*   GFR: CrCl cannot be calculated (Unknown ideal weight.). Liver Function Tests: Recent Labs  Lab 11/20/18 2325  AST 16  ALT 32  ALKPHOS 84  BILITOT 0.8  PROT 8.4*  ALBUMIN 4.4   Recent Labs  Lab 11/20/18 2325  LIPASE 58*   No results for input(s): AMMONIA in the last 168 hours. Coagulation Profile: No results for input(s): INR, PROTIME in the last 168 hours. Cardiac Enzymes: No results for input(s): CKTOTAL, CKMB, CKMBINDEX, TROPONINI in the last 168 hours. BNP (last 3 results) No results for input(s): PROBNP in the last 8760 hours. HbA1C: No results for input(s): HGBA1C in the last 72 hours. CBG: No results for input(s): GLUCAP in the last 168 hours. Lipid Profile: No results for input(s): CHOL, HDL, LDLCALC, TRIG, CHOLHDL, LDLDIRECT in the last 72 hours. Thyroid Function Tests: No results for input(s): TSH, T4TOTAL, FREET4, T3FREE, THYROIDAB in the last 72 hours. Anemia Panel: No results for input(s): VITAMINB12, FOLATE, FERRITIN, TIBC, IRON, RETICCTPCT in the last 72 hours. Urine analysis:    Component Value Date/Time   COLORURINE STRAW (A) 11/20/2018 2229   APPEARANCEUR CLEAR 11/20/2018 2229   LABSPEC 1.013 11/20/2018 2229   PHURINE 5.0 11/20/2018 2229   GLUCOSEU NEGATIVE 11/20/2018 2229   HGBUR MODERATE (A) 11/20/2018 2229   BILIRUBINUR NEGATIVE 11/20/2018 2229   KETONESUR NEGATIVE 11/20/2018 2229   PROTEINUR 100 (A) 11/20/2018 2229   NITRITE NEGATIVE  11/20/2018 2229   LEUKOCYTESUR NEGATIVE 11/20/2018 2229   Sepsis Labs: @LABRCNTIP (procalcitonin:4,lacticidven:4) )No results found for this or any previous visit (from the past 240 hour(s)).   Radiological Exams on Admission: Ct Renal Stone Study  Result Date: 11/21/2018 CLINICAL DATA:  Flank pain. Stone disease suspected. Patient reports low back pain with 3 falls in August. EXAM: CT ABDOMEN AND PELVIS WITHOUT CONTRAST TECHNIQUE: Multidetector CT imaging of the abdomen and pelvis was performed following the standard protocol without IV contrast. COMPARISON:  Lumbar spine MRI 11/08/2018. FINDINGS: Lower chest: The lung bases are clear. Hepatobiliary: No focal liver abnormality is seen. No gallstones, gallbladder wall thickening, or biliary dilatation. Pancreas: No ductal dilatation or inflammation. Spleen: Normal in size without focal abnormality. Adrenals/Urinary Tract: Normal adrenal glands. No no hydronephrosis. Punctate nonobstructing stone in the lower left kidney. No perinephric edema. Both ureters are decompressed without stone along the course. Urinary bladder is physiologically distended, no bladder wall thickening or  stone. Stomach/Bowel: Stomach is within normal limits. Appendix appears normal. No evidence of bowel wall thickening, distention, or inflammatory changes. Vascular/Lymphatic: Mild aortic atherosclerosis. No aneurysm. No enlarged lymph nodes in the abdomen or pelvis. Reproductive: Prostate is unremarkable. Other: No free air, free fluid, or intra-abdominal fluid collection. Tiny fat containing umbilical hernia. Musculoskeletal: Chronic superior endplate compression fractures of T12 and L1 prominent Schmorl's node superior endplate of L2. Bone marrow involving the spine and to a lesser extent pelvis is diffusely heterogeneous in generally low-density. Bone island in the right sacrum. IMPRESSION: 1. Punctate nonobstructing stone in the lower left kidney. No hydronephrosis or obstructive  uropathy. 2. No acute abnormality in the abdomen/pelvis. 3. Diffusely heterogeneous and abnormal appearance of the bone marrow primarily involving the spine, suggesting underlying metabolic etiology, including but not limited to hyperparathyroidism. No focal abnormality was seen on recent lumbar spine MRI. Chronic mild compression deformities of T12 and L1 are unchanged. Aortic Atherosclerosis (ICD10-I70.0). Electronically Signed   By: Keith Rake M.D.   On: 11/21/2018 02:44    Assessment/Plan Principal Problem:   ARF (acute renal failure) (HCC) Active Problems:   Low back pain   Hypercalcemia   Acute renal failure (ARF) ([place])    1. Acute renal failure -cause not clear.  Patient was recently on NSAIDs.  Denies any vomiting or diarrhea though did have some nausea poor appetite.  CT scan does not show any obstruction.  Plan was to start patient on IV fluids.  Patient at this time states that his religious belief will not allow him to have IV fluids and is planning to take oral feeds.  Check FENa.  If creatinine does not improve will need nephrology input. 2. Hypercalcemia -CT scan of the abdomen pelvis shows some marrow changes concerning for metabolic etiology.  Will check parathormone vitamin D levels and phosphorus levels.  Patient is refusing IV fluids.  Is taking oral feeds.  Follow metabolic panel closely.  May need nephrology input. 3. Low back pain being followed by orthopedic surgeon as outpatient.  We will keep patient on PRN Tylenol for now.  Has had recent lumbar spine MRI.  Patient at this time is refusing to get COVID-19 test done.  Discussed with charge nurse and patient be kept on airborne precautions.  Patient is also refusing IV fluids due to religious believes and states that he will be talking to his priest before any further consideration of IV fluids.  Presently taking orally feeds.  Addendum -patient and patient's family at the bedside was updated about patient's CT scan  findings.   DVT prophylaxis: SCDs.  No other patient does not want any pork products. Code Status: Full code. Family Communication: Family at the bedside. Disposition Plan: Home. Consults called: None. Admission status: Observation.   Rise Patience MD [facility] Pager [phone].  If 7PM-7AM, please contact night-coverage [email or web address] Password [email or web address]  11/21/2018, 5:14 AM

## 2018-11-21 NOTE — ED Notes (Signed)
Pt refuses IV fluids and IV

## 2018-11-21 NOTE — Significant Event (Signed)
Patient Refused COVID testing. Explained that he would have to remind on Airborne and Contract precautions throughout his stay. Voiced understanding. Bedside nurse also made aware.

## 2018-11-21 NOTE — ED Notes (Signed)
Pt refuses to take a covid test, at this time.
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
## 2018-11-21 NOTE — ED Notes (Signed)
Date and time results received: 11/21/18 1001 (use smartphrase ".now" to insert current time)  Test: Calcium  Critical Value: 13.7  Name of Provider Notified: Dr. Flora Lipps MD - paged provider and waiting for response  Orders Received? Or Actions Taken?: Waiting for orders

## 2018-11-21 NOTE — ED Notes (Signed)
Per conversation with hospitalist to push PO clear liquids.

## 2018-11-21 NOTE — Progress Notes (Signed)
Same day note  Patient seen and examined at bedside.  Patient was admitted to the hospital for severe acute kidney injury.  At the time of my evaluation, patient complains of back pain.  Denies any nausea, vomiting or stomach pain at the time of my evaluation but has had nausea and abdominal discomfort recently from NSAIDs.  Physical examination essentially normal  Laboratory data and imaging was reviewed  Assessment and Plan.  Severe acute kidney injury likely secondary to volume depletion/NSAID usage.  I had a prolonged discussion with the patient regarding the need for IV fluids and IV access though he fused initially.  Subsequently he was agreeable to an IV and IV fluids.  Patient was encouraged to drink oral fluids as well.  CT scan of the abdomen did not show any evidence of hydronephrosis or obstruction.  Will need to measure strict intake and output charting.  Check BMP every 12H  Hypercalcemia we will aggressively continue with IV fluids first before making a decision on treating hypercalcemia.  He has not had any IV fluids so far.  Obtain EKG.  Leukocytosis could be reactive in nature.  Spoke with the patient's family at bedside regarding the complications from severe acute kidney injury including electrolyte imbalance cardiac arrhythmias and patient has finally accepted IV fluids.  Spoke with the nursing staff about it as well.  We will change the patient's status to inpatient at this time due to need for IV fluids closer monitoring of his renal function, monitoring for cardiac arrhythmias, treatment of hypercalcemia and other electrolyte disturbances.  Possible need for nephrology evaluation if he does not improve.  No Charge  Signed,  Delila Pereyra, MD [facility]

## 2018-11-22 ENCOUNTER — Inpatient Hospital Stay (HOSPITAL_COMMUNITY): Payer: BC Managed Care – PPO

## 2018-11-22 ENCOUNTER — Encounter (HOSPITAL_COMMUNITY): Payer: Self-pay | Admitting: Nephrology

## 2018-11-22 LAB — BASIC METABOLIC PANEL
Anion gap: 12 (ref 5–15)
BUN: 110 mg/dL — ABNORMAL HIGH (ref 6–20)
CO2: 20 mmol/L — ABNORMAL LOW (ref 22–32)
Calcium: 12.1 mg/dL — ABNORMAL HIGH (ref 8.9–10.3)
Chloride: 100 mmol/L (ref 98–111)
Creatinine, Ser: 9.54 mg/dL — ABNORMAL HIGH (ref 0.61–1.24)
GFR calc Af Amer: 7 mL/min — ABNORMAL LOW (ref >60)
GFR calc non Af Amer: 6 mL/min — ABNORMAL LOW (ref >60)
Glucose, Bld: 114 mg/dL — ABNORMAL HIGH (ref 70–99)
Potassium: 4.7 mmol/L (ref 3.5–5.1)
Sodium: 132 mmol/L — ABNORMAL LOW (ref 135–145)

## 2018-11-22 LAB — PROTEIN ELECTROPHORESIS, SERUM
A/G Ratio: 1.4 (ref 0.7–1.7)
Albumin ELP: 4.2 g/dL (ref 2.9–4.4)
Alpha-1-Globulin: 0.2 g/dL (ref 0.0–0.4)
Alpha-2-Globulin: 1.2 g/dL — ABNORMAL HIGH (ref 0.4–1.0)
Beta Globulin: 1 g/dL (ref 0.7–1.3)
Gamma Globulin: 0.6 g/dL (ref 0.4–1.8)
Globulin, Total: 3 g/dL (ref 2.2–3.9)
M-Spike, %: 0.2 g/dL — ABNORMAL HIGH
Total Protein ELP: 7.2 g/dL (ref 6.0–8.5)

## 2018-11-22 LAB — CBC
HCT: 31.9 % — ABNORMAL LOW (ref 39.0–52.0)
Hemoglobin: 10.9 g/dL — ABNORMAL LOW (ref 13.0–17.0)
MCH: 28.2 pg (ref 26.0–34.0)
MCHC: 34.2 g/dL (ref 30.0–36.0)
MCV: 82.6 fL (ref 80.0–100.0)
Platelets: 390 10*3/uL (ref 150–400)
RBC: 3.86 MIL/uL — ABNORMAL LOW (ref 4.22–5.81)
RDW: 12.8 % (ref 11.5–15.5)
WBC: 8.6 10*3/uL (ref 4.0–10.5)
nRBC: 0 % (ref 0.0–0.2)

## 2018-11-22 LAB — VITAMIN D 25 HYDROXY (VIT D DEFICIENCY, FRACTURES): Vit D, 25-Hydroxy: 19.3 ng/mL — ABNORMAL LOW (ref 30.0–100.0)

## 2018-11-22 LAB — HIV ANTIBODY (ROUTINE TESTING W REFLEX): HIV Screen 4th Generation wRfx: NONREACTIVE

## 2018-11-22 LAB — MAGNESIUM: Magnesium: 2.3 mg/dL (ref 1.7–2.4)

## 2018-11-22 LAB — PTH, INTACT AND CALCIUM
Calcium, Total (PTH): 13 mg/dL — ABNORMAL HIGH (ref 8.7–10.2)
PTH: 12 pg/mL — ABNORMAL LOW (ref 15–65)

## 2018-11-22 LAB — PHOSPHORUS: Phosphorus: 8.3 mg/dL — ABNORMAL HIGH (ref 2.5–4.6)

## 2018-11-22 LAB — CK: Total CK: 92 U/L (ref 49–397)

## 2018-11-22 LAB — SARS CORONAVIRUS 2 (TAT 6-24 HRS): SARS Coronavirus 2: NEGATIVE

## 2018-11-22 MED ORDER — ONDANSETRON 4 MG PO TBDP
4.0000 mg | ORAL_TABLET | Freq: Once | ORAL | Status: AC
  Administered 2018-11-22: 4 mg via ORAL
  Filled 2018-11-22: qty 1

## 2018-11-22 MED ORDER — ONDANSETRON HCL 4 MG PO TABS: 4.0000 mg | ORAL_TABLET | ORAL | Status: DC | PRN

## 2018-11-22 MED ORDER — ONDANSETRON HCL 4 MG/2ML IJ SOLN: 4.0000 mg | Freq: Four times a day (QID) | INTRAMUSCULAR | Status: DC | PRN

## 2018-11-22 MED ORDER — CALCITONIN (SALMON) 200 UNIT/ML IJ SOLN
4.0000 [IU]/kg | Freq: Two times a day (BID) | INTRAMUSCULAR | Status: DC
  Administered 2018-11-22: 358 [IU] via SUBCUTANEOUS
  Filled 2018-11-22 (×4): qty 1.79

## 2018-11-22 MED ORDER — SODIUM CHLORIDE 0.9 % IV SOLN
60.0000 mg | Freq: Once | INTRAVENOUS | Status: AC
  Administered 2018-11-22: 60 mg via INTRAVENOUS
  Filled 2018-11-22: qty 20

## 2018-11-22 NOTE — Progress Notes (Addendum)
Upon shift change previous RN reported that pt refused to have COVID test performed. Pt not on isolation at this time. Pt placed on appropriate precautions per order set. Pt and wife stated he cannot have test performed because it will exacerbate his back pain. Dewaine Oats, NP personally talked to pt and wife and provided education on importance of having COVID test performed. Pt and wife also informed on importance of airborne and contact precautions implemented to keep everyone at the hospital safe. Pt and wife receptive of having less invasive COVID test performed that does not go into the nasopharyngeal cavity. On call provider Dewaine Oats made aware. Lab also notified. Test performed and walked down to lab by this RN. Wife updated on visitor policy for pending COVID pts and asked to leave due to hospital protocol. Wife anxious and distressed after receiving this news. Misha, Hospital Administrator at bedside to reinforce protocol to patient and wife. Wife's contact information placed in chart and on pt's board. Wife reassured that she will be the point of contact for pt and will receive daily updates. Units phone number given to pt's wife as well. Pt resting comfortably at this time  Pt verbalizes no complaints or needs. Will continue with plan of care for pt.

## 2018-11-22 NOTE — Progress Notes (Signed)
Prior to pt's wife leaving hospital, wife privately requested for pt's primary nurse to be changed. This was discussed with pt who is alert and oriented x 4 and therefore capable of making decisions of his own healthcare. Pt requested for his care team to not be changed at this time. Pt expresses no conflict with staff and verbalizes understanding of all policies and protocols that have been initiated. Will continue with plan of care for pt.

## 2018-11-22 NOTE — Progress Notes (Signed)
Covid (-), airborne/contact precautions DC'd as per protocol.

## 2018-11-22 NOTE — Progress Notes (Signed)
Patient concerned about new medications ordered by nephrology.  Patient states he doesn't like being a "pin cushion" and he is not refusing the medication but needs to know what is in the medication first.  This RN placed call to pharmacy to ask that a pharmacist come up and speak to patient regarding these medications.

## 2018-11-22 NOTE — Consult Note (Addendum)
Renal Service Consult Note [facility]  Derek [name] 11/22/2018 Sol Blazing Requesting Physician: Dr Louanne Belton  Reason for Consult:  Renal failure HPI: The patient is a 43 y.o. year-old with no significant PMH presented to ED on 9/1 with 2 months hx of back pains. In ED labs showed BUN 123 and Cr 10.8, Ca 13.6 and albumin 4.4. UA was negative except for 100 protein. Pt was admitted.  CT renal stone study showed normal kidneys w/o hydro. Dx was AKI due to nsaids, poor po intake.  Pt started on IVF's.  25 Vit D and pth are low.  Creat is down from 10.8 to 9.5 today.  UOP yest was 2.1 L. Ca is down from 13.6 to 12.1.  Asked to see for reanl failure.   Patient has been taking some nsaid's.  Taking some OTC Tum's but for less than 1- 2wks and not in high doses ("once a day at most").  He is a vague historian and is in pain.  No hx renal disease, stones, no voiding issues.  No old labs in system.  CT showed normal appearing large kidney's w/o hydro and no signs of atrophy. No appetite problems , no n/v and no confusion or hallucination. No change in urine color.   ROS  denies CP  no joint pain   no HA  no blurry vision  no rash  no diarrhea  no nausea/ vomiting  no dysuria  no difficulty voiding  no change in urine color    Past Medical History  Past Medical History:  Diagnosis Date  . Depression    Past Surgical History History reviewed. No pertinent surgical history. Family History  Family History  Problem Relation Age of Onset  . Diabetes Maternal Grandmother    Social History  reports that he quit smoking about 13 years ago. His smoking use included cigarettes. He has never used smokeless tobacco. He reports that he does not drink alcohol or use drugs. Allergies  Allergies  Allergen Reactions  . Aspirin Hives and Anxiety   Home medications Prior to Admission medications   Medication Sig Start Date End Date Taking? Authorizing Provider  baclofen  (LIORESAL) 10 MG tablet Take 0.5-1 tablets (5-10 mg total) by mouth 3 (three) times daily as needed for muscle spasms. 11/01/18  Yes Hilts, Legrand Como, MD  Biotin 1000 MCG CHEW Chew 1,000 mg by mouth daily.    Yes [provider]  ibuprofen (ADVIL) 200 MG tablet Take 200 mg by mouth every 6 (six) hours as needed.   Yes [provider]  Multiple Vitamin (MULTIVITAMIN WITH MINERALS) TABS tablet Take 1 tablet by mouth daily.   Yes [provider]  nabumetone (RELAFEN) 750 MG tablet Take 1 tablet (750 mg total) by mouth 2 (two) times daily as needed. 11/17/18  Yes Hilts, Legrand Como, MD  ondansetron (ZOFRAN) 4 MG tablet Take 1 tablet (4 mg total) by mouth every 8 (eight) hours as needed for nausea or vomiting. 11/17/18  Yes Hilts, Legrand Como, MD  HYDROcodone-acetaminophen (NORCO/VICODIN) 5-325 MG tablet Take 1 tablet by mouth every 6 (six) hours as needed for moderate pain. Patient not taking: Reported on 11/20/2018 11/01/18   Hilts, Michael, MD  Magnesium Salicylate (DOANS PILLS PO) Take by mouth.    [provider]  methylPREDNISolone (MEDROL DOSEPAK) 4 MG TBPK tablet As directed for 6 days. Patient not taking: Reported on 11/20/2018 11/01/18   Eunice Blase, MD   Liver Function Tests Recent Labs  Lab 11/20/18  2325 11/21/18 0923  AST 16 17  ALT 32 30  ALKPHOS 84 82  BILITOT 0.8 0.8  PROT 8.4* 8.0  ALBUMIN 4.4 4.0   Recent Labs  Lab 11/20/18 2325  LIPASE 58*   CBC Recent Labs  Lab 11/20/18 2325 11/22/18 0420  WBC 14.1* 8.6  NEUTROABS 10.9*  --   HGB 13.4 10.9*  HCT 38.8* 31.9*  MCV 81.3 82.6  PLT 457* [id number]   Basic Metabolic Panel Recent Labs  Lab 11/20/18 2325 11/21/18 0923 11/21/18 1604 11/21/18 2155 11/22/18 0420  NA 133* 130* 129* 132* 132*  K 5.1 5.0 4.7 4.8 4.7  CL 94* 92* 92* 98 100  CO2 21* 19* 21* 22 20*  GLUCOSE 113* 100* 100* 99 114*  BUN 123* 124* 116* 114* 110*  CREATININE 10.85* 10.32* 10.39* 10.05* 9.54*  CALCIUM 13.6* 13.7*   13.0* 12.8* 12.7* 12.1*  PHOS  --  8.0*  --   --  8.3*   Iron/TIBC/Ferritin/ %Sat No results found for: IRON, TIBC, FERRITIN, IRONPCTSAT  Vitals:   11/21/18 1339 11/21/18 2122 11/22/18 0225 11/22/18 0458  BP: (!) 136/92 (!) 136/93  138/88  Pulse: 74 82  78  Resp: 20 18  20   Temp: 97.7 F (36.5 C) 98.3 F (36.8 C)  98 F (36.7 C)  TempSrc: Oral Oral  Oral  SpO2: 100% 100%  99%  Weight:   89.7 kg   Height:   6' (1.829 m)     Exam Gen dry skin, in pain, not in distress, pleasant and Ox3 No rash, cyanosis or gangrene Sclera anicteric, throat clear  No jvd or bruits Chest clear bilat to bases RRR no MRG Abd soft ntnd no mass or ascites +bs GU normal male MS no joint effusions or deformity Ext no LE or UE edema, no wounds or ulcers Neuro is alert, Ox 3 , nf    Home meds:  - baclofen 5-10 mg prn/ ibuprofen 200 qid prn/ nabumetone 750 bid prn/ hydrocodone-acetaminophen qid prn/ ondansetron 4mg  tid prn - magnesium salicylate prn po - methylprednisolone dosepak 4mg  x 6 days ut dict     Assessment/ Plan: 1. Renal failure - presumable acute, associated w/ hypercalcemia and prob volume depletion related to severe back pain and poor po intake.  Not sure cause of hyperCa++, not taking a lot of OTC Ca++ per my interview. Malignancy is a consideration.  No signs of obstruction on CT, renal US pending.  UA not suggestive of a GN.  Mult myeloma causes renal failure and ^Ca++ but usually there is anemia as well, which is not the case here. Would cont IVF's at 200/ hr and add calcitriol. Consider pamidronate, will d/w pharmacy. Will send off myeloma screening labs.  Avoid nsaids/ ACEi/ ARB's. Follow clinically, no indication for RRT at this time.   2. Back pain - +compression fractures by recent MRI, no LE weakness 3. Hypercalcemia - unclear cause, PTH low, ? Malignancy.     Kelly Splinter  MD 11/22/2018, 1:31 PM

## 2018-11-22 NOTE — Progress Notes (Signed)
Patients spouse upset over visiting policy and not being allowed to stay overnight with spouse.  Patient has refused covid testing due to anticipated pain from nasal swab.  Patients spouse states that he has "spasms'' when in pain and the nasal swabbing will cause spasms.  NP notified, after speaking with patient and spouse, orders given for less invasive nasal swab.  Patient agreed to less invasive swab.  Patient was educated and placed on Airborne and contact precautions.  Before leaving the hospital, patients spouse called the unit and requested that current nurse be removed from her spouses care.  She felt like it was the nurses fault that she could not stay even after being educated on the current visiting policy.  Nursing supervisor spoke with patient who is alert and oriented.  Patient stated that he did not want nurse removed from his care team.  No changes made in nursing assignment, spouse was made aware.

## 2018-11-22 NOTE — Progress Notes (Signed)
PROGRESS NOTE  Derek [name] [id number] DOB: Aug 04, 1975 DOA: 11/20/2018 PCP: Posey Boyer, MD   LOS: 1 day   Brief narrative:  Derek [name] is a 43 y.o. male with no significant past medical history who has been having increasing low back pain and has been following up with orthopedic surgeon was prescribed steroid Dosepak about 3 weeks ago as per the patient following which he was prescribed Voltaren for few days and also had an MRI which showed some disc bulge around L4-L5 and compression fracture around T12-L1.  Since pain was not improving was placed on nabumetone which patient took at least 3-4 doses.  Patient eventually started having some nausea.  Over the last 1 week because of nausea patient's appetite has decreased.  But still was able to drink some fluid and Gatorade.  Since patient's symptoms did not improve patient came to ER.  Denies any weakness of the lower extremity.  Pain is mostly in the low back.  ED Course: In the ER patient's labs show creatinine of 10.8 BUN 123 lipase 58 WBC count 14.1 total protein 8.4 calcium 13.6 albumin 4.4 UA shows 100 protein rare bacteria moderate hemoglobin but negative for RBC WBC.  CT renal study does not show anything acute in the abdominal pelvis area.  This shows marrow features concerning for metabolic changes.  No obstruction seen in the kidneys.  Patient is being admitted for acute renal failure with hypercalcemia.  COVID-19 test was negative.  Assessment/Plan:  Principal Problem:   ARF (acute renal failure) (HCC) Active Problems:   Low back pain   Hypercalcemia   Acute renal failure (ARF) (HCC)   AKI (acute kidney injury) ([place])  Severe acute kidney injury likely secondary to volume depletion/NSAID usage.  FeNa shows possibility of prerenal syndrome.  The possibility of mild ATN as well from NSAIDs usage.  Mild improvement compared to yesterday.  Patient has finally agreed to be on IV fluids.  We will closely monitor intake  and output charting.   CT scan of the abdomen did not show any evidence of hydronephrosis or obstruction, but nonobstructive calculus. Will need to measure strict intake and output charting.    Magnesium 2.3.  Phosphorus of 8.3.  Continue renal diet.  Will try to avoid nephrotoxic medication.  Anion gap is improved. HIV nonreactive.  Protein electrophoresis pending.  Urine shows mild proteinuria and dipstick was positive for blood.  Urine RBC 0-5.  CK level was 92.  Spoke with dr Lorrin Jackson nephrology for consultation.  Added to urine for lisinopril.  T12-L1 compression fracture. Likely traumatic from the fall.  Orthopedic managing as outpatient.  Abnormal density of the spine on CT scan.  Unclear what it was.  PTH was not elevated.  Vitamin D was also low.  Serum electrophoresis pending.  Hypercalcemia likely secondary to severe dehydration.  Has improved with IV fluid hydration.  We will continue to hydrate for now.  Has mildly improved.  Vitamin D level was low.  PTH was low.  Will get a nephrology opinion  Mild metabolic acidosis.  Likely secondary to severe AKI.  Continue on fluid hydration.  Leukocytosis could be reactive in nature.  Has improved and normalized today.  Back pain and neck spasms.  Patient has been seen by orthopedics as outpatient.  Added baclofen yesterday for back spasms.  Patient recently had lumbar spine MRI.  Mild anemia.  Will closely monitor.  No evidence of blood loss.   VTE Prophylaxis: SCD  Code Status: Full  code  Family Communication: Spoke with significant other and updated her about the clinical condition of the patient.  Disposition Plan: Home likely in 2 to 3 days ending renal improvement.  Will consult nephrology.   Consultants:  Nephrology  Procedures:  None  Antibiotics: Anti-infectives (From admission, onward)   None      Subjective: Overall feels better.  Does have some spasms in his back.  No nausea vomiting or abdominal pain.  Denies  any chest pain, shortness of breath, fever or chills.  Objective: Vitals:   11/21/18 2122 11/22/18 0458  BP: (!) 136/93 138/88  Pulse: 82 78  Resp: 18 20  Temp: 98.3 F (36.8 C) 98 F (36.7 C)  SpO2: 100% 99%    Intake/Output Summary (Last 24 hours) at 11/22/2018 0748 Last data filed at 11/22/2018 0600 Gross per 24 hour  Intake 3343.76 ml  Output 2100 ml  Net 1243.76 ml   Filed Weights   11/22/18 0225  Weight: 89.7 kg   Body mass index is 26.81 kg/m.   Physical Exam: GENERAL: Patient is alert awake and oriented. Not in obvious distress. HENT: No scleral pallor or icterus. Pupils equally reactive to light. Oral mucosa is moist NECK: is supple, no palpable thyroid enlargement. CHEST: Clear to auscultation. No crackles or wheezes. Non tender on palpation. Diminished breath sounds bilaterally. CVS: S1 and S2 heard, no murmur. Regular rate and rhythm. No pericardial rub. ABDOMEN: Soft, non-tender, bowel sounds are present.  Back tenderness on palpation. No palpable hepato-splenomegaly. EXTREMITIES: No edema. CNS: Cranial nerves are intact. No focal motor or sensory deficits. SKIN: warm and dry without rashes.  Data Review: I have personally reviewed the following laboratory data and studies,  CBC: Recent Labs  Lab 11/20/18 2325 11/22/18 0420  WBC 14.1* 8.6  NEUTROABS 10.9*  --   HGB 13.4 10.9*  HCT 38.8* 31.9*  MCV 81.3 82.6  PLT 457* [id number]   Basic Metabolic Panel: Recent Labs  Lab 11/20/18 2325 11/21/18 0923 11/21/18 1604 11/21/18 2155 11/22/18 0420  NA 133* 130* 129* 132* 132*  K 5.1 5.0 4.7 4.8 4.7  CL 94* 92* 92* 98 100  CO2 21* 19* 21* 22 20*  GLUCOSE 113* 100* 100* 99 114*  BUN 123* 124* 116* 114* 110*  CREATININE 10.85* 10.32* 10.39* 10.05* 9.54*  CALCIUM 13.6* 13.7*   13.0* 12.8* 12.7* 12.1*  MG  --   --   --   --  2.3  PHOS  --  8.0*  --   --  8.3*   Liver Function Tests: Recent Labs  Lab 11/20/18 2325 11/21/18 0923  AST 16 17  ALT 32 30    ALKPHOS 84 82  BILITOT 0.8 0.8  PROT 8.4* 8.0  ALBUMIN 4.4 4.0   Recent Labs  Lab 11/20/18 2325  LIPASE 58*   No results for input(s): AMMONIA in the last 168 hours. Cardiac Enzymes: No results for input(s): CKTOTAL, CKMB, CKMBINDEX, TROPONINI in the last 168 hours. BNP (last 3 results) No results for input(s): BNP in the last 8760 hours.  ProBNP (last 3 results) No results for input(s): PROBNP in the last 8760 hours.  CBG: No results for input(s): GLUCAP in the last 168 hours. Recent Results (from the past 240 hour(s))  SARS CORONAVIRUS 2 (TAT 6-24 HRS) Nasopharyngeal Nasopharyngeal Swab     Status: None   Collection Time: 11/21/18  8:50 PM   Specimen: Nasopharyngeal Swab  Result Value Ref Range Status   SARS Coronavirus 2  NEGATIVE NEGATIVE Final    Comment: (NOTE) SARS-CoV-2 target nucleic acids are NOT DETECTED. The SARS-CoV-2 RNA is generally detectable in upper and lower respiratory specimens during the acute phase of infection. Negative results do not preclude SARS-CoV-2 infection, do not rule out co-infections with other pathogens, and should not be used as the sole basis for treatment or other patient management decisions. Negative results must be combined with clinical observations, patient history, and epidemiological information. The expected result is Negative. Fact Sheet for Patients: SugarRoll.be Fact Sheet for Healthcare Providers: https://www.woods-mathews.com/ This test is not yet approved or cleared by the [place] FDA and  has been authorized for detection and/or diagnosis of SARS-CoV-2 by FDA under an Emergency Use Authorization (EUA). This EUA will remain  in effect (meaning this test can be used) for the duration of the COVID-19 declaration under Section 56 4(b)(1) of the Act, 21 U.S.C. section 360bbb-3(b)(1), unless the authorization is terminated or revoked sooner. Performed at [facility], [place] [address], [place], [place] [address]      Studies: Ct Renal Stone Study  Result Date: 11/21/2018 CLINICAL DATA:  Flank pain. Stone disease suspected. Patient reports low back pain with 3 falls in August. EXAM: CT ABDOMEN AND PELVIS WITHOUT CONTRAST TECHNIQUE: Multidetector CT imaging of the abdomen and pelvis was performed following the standard protocol without IV contrast. COMPARISON:  Lumbar spine MRI 11/08/2018. FINDINGS: Lower chest: The lung bases are clear. Hepatobiliary: No focal liver abnormality is seen. No gallstones, gallbladder wall thickening, or biliary dilatation. Pancreas: No ductal dilatation or inflammation. Spleen: Normal in size without focal abnormality. Adrenals/Urinary Tract: Normal adrenal glands. No no hydronephrosis. Punctate nonobstructing stone in the lower left kidney. No perinephric edema. Both ureters are decompressed without stone along the course. Urinary bladder is physiologically distended, no bladder wall thickening or stone. Stomach/Bowel: Stomach is within normal limits. Appendix appears normal. No evidence of bowel wall thickening, distention, or inflammatory changes. Vascular/Lymphatic: Mild aortic atherosclerosis. No aneurysm. No enlarged lymph nodes in the abdomen or pelvis. Reproductive: Prostate is unremarkable. Other: No free air, free fluid, or intra-abdominal fluid collection. Tiny fat containing umbilical hernia. Musculoskeletal: Chronic superior endplate compression fractures of T12 and L1 prominent Schmorl's node superior endplate of L2. Bone marrow involving the spine and to a lesser extent pelvis is diffusely heterogeneous in generally low-density. Bone island in the right sacrum. IMPRESSION: 1. Punctate nonobstructing stone in the lower left kidney. No hydronephrosis or obstructive uropathy. 2. No acute abnormality in the abdomen/pelvis. 3. Diffusely heterogeneous and abnormal appearance of the bone marrow primarily involving the spine, suggesting  underlying metabolic etiology, including but not limited to hyperparathyroidism. No focal abnormality was seen on recent lumbar spine MRI. Chronic mild compression deformities of T12 and L1 are unchanged. Aortic Atherosclerosis (ICD10-I70.0). Electronically Signed   By: Keith Rake M.D.   On: 11/21/2018 02:44    Scheduled Meds:  Continuous Infusions:  sodium chloride 200 mL/hr at 11/22/18 [id number]     Flora Lipps, MD  [facility] 11/22/2018

## 2018-11-22 NOTE — Progress Notes (Signed)
Pharmacy - Consult for Pamidronate  Assessment: 3 yoM with severe AKI and hypercalcemia of unknown origin; possibly d/t dehydration or malignancy. Not fully c/w multiple myeloma however. Hypercalcemia has not fully resolved with IV fluids, and calcitonin already ordered per Nephrologist. Pharmacy consulted for recommendation for bisphosphonate in AKI.   Plan:   Limited safety and efficacy data exists for Pamidronate use in renal impairment (SCr > 3 or CrCl < 30 ml/min), but d/t concerns for possible symptoms of hypercalcemia or cardiac effects, probably reasonable to treat using a reduced dose  Pamidronate 60 mg IV x1 over 4 hr  F/u Ca levels but would avoid redosing  Pharmacy will sign off  Reuel Boom, PharmD, BCPS [phone] 11/22/2018, 2:29 PM'

## 2018-11-23 LAB — RENAL FUNCTION PANEL
Albumin: 3.5 g/dL (ref 3.5–5.0)
Anion gap: 10 (ref 5–15)
BUN: 93 mg/dL — ABNORMAL HIGH (ref 6–20)
CO2: 18 mmol/L — ABNORMAL LOW (ref 22–32)
Calcium: 10.3 mg/dL (ref 8.9–10.3)
Chloride: 107 mmol/L (ref 98–111)
Creatinine, Ser: 8.37 mg/dL — ABNORMAL HIGH (ref 0.61–1.24)
GFR calc Af Amer: 8 mL/min — ABNORMAL LOW (ref >60)
GFR calc non Af Amer: 7 mL/min — ABNORMAL LOW (ref >60)
Glucose, Bld: 91 mg/dL (ref 70–99)
Phosphorus: 6.1 mg/dL — ABNORMAL HIGH (ref 2.5–4.6)
Potassium: 4.7 mmol/L (ref 3.5–5.1)
Sodium: 135 mmol/L (ref 135–145)

## 2018-11-23 LAB — KAPPA/LAMBDA LIGHT CHAINS
Kappa free light chain: 10485.2 mg/L — ABNORMAL HIGH (ref 3.3–19.4)
Kappa, lambda light chain ratio: 1205.2 — ABNORMAL HIGH (ref 0.26–1.65)
Lambda free light chains: 8.7 mg/L (ref 5.7–26.3)

## 2018-11-23 MED ORDER — ONDANSETRON 4 MG PO TBDP
4.0000 mg | ORAL_TABLET | ORAL | Status: DC | PRN
  Administered 2018-11-23 – 2018-12-01 (×10): 4 mg via ORAL
  Filled 2018-11-23 (×12): qty 1

## 2018-11-23 NOTE — Progress Notes (Signed)
PROGRESS NOTE  Derek [name] [id number] DOB: 11-10-1975 DOA: 11/20/2018 PCP: Posey Boyer, MD   LOS: 2 days   Brief narrative:  Derek [name] is a 43 y.o. male with no significant past medical history who has been having increasing low back pain and has been following up with orthopedic surgeon was prescribed steroid Dosepak about 3 weeks ago as per the patient following which he was prescribed Voltaren for few days and also had an MRI which showed some disc bulge around L4-L5 and compression fracture around T12-L1.  Since pain was not improving, he was placed on nabumetone which patient took at least 3-4 doses.  Patient eventually started having some nausea.  Over the last 1 week because of nausea and appetite has decreased.  But still was able to drink some fluid and Gatorade.  Since patient's symptoms did not improve patient came to ER.  Denies any weakness of the lower extremity.  Pain is mostly in the low back.  In the ER patient's labs show creatinine of 10.8 BUN 123 lipase 58 WBC count 14.1 total protein 8.4 calcium 13.6 albumin 4.4 UA shows 100 protein rare bacteria moderate hemoglobin but negative for RBC WBC.  CT renal study does not show anything acute in the abdominal pelvis area.  This shows marrow features concerning for metabolic changes.  No obstruction seen in the kidneys.  Patient is being admitted for acute renal failure with hypercalcemia.  COVID-19 test was negative.  Assessment/Plan:  Principal Problem:   ARF (acute renal failure) (HCC) Active Problems:   Low back pain   Hypercalcemia   Acute renal failure (ARF) (HCC)   AKI (acute kidney injury) ([place])  Severe acute kidney injury likely secondary to volume depletion/NSAID usage.  Creatinine has improved from 10.8 on presentation to 8.37 today.  FeNa shows  prerenal syndrome.  The possibility of mild ATN as well from NSAIDs usage.   improvement noted since admission.  Continue IV fluids, currently at 275ml/hr  with good diuresis. He denies dyspnea.   We will closely monitor intake and output charting.  Negative balance documented in the computer. CT scan of the abdomen did not show any evidence of hydronephrosis or obstruction, but nonobstructive calculus.  HIV nonreactive.  Urine shows mild proteinuria and dipstick was positive for blood.  Urine RBC 0-5.  CK level was 92.  Appreciated nephrology evaluation.  Will follow recommendations.  T12-L1 compression fracture. Likely traumatic from recent fall.  Orthopedic managing as outpatient.  Nightly as needed for now.  Hyponatremia.  Improved with IV fluids.  Check BMP in a.m.  Abnormal density of the spine on CT scan.  Unclear of this finding.Marland Kitchen  PTH was not elevated.  Vitamin D was also low.  Check serum electrophoresis.  Hypercalcemia likely secondary to severe dehydration.  Has improved with IV fluid hydration, pamidronate and calcitonin.  Patient did have nausea after pamidronate yesterday and has refused calcitonin this morning.  Nausea is better today.  Vitamin D level was low.  PTH was low.  Nephrology on board.  Leukocytosis reactive  Back pain and neck spasms.  Patient has been seen by orthopedics as outpatient.  Continue baclofen for back spasms.  Patient recently had lumbar spine MRI.  Mild anemia.  Will closely monitor.  No evidence of blood loss.  VTE Prophylaxis: SCD,   Code Status: Full code  Family Communication: None today  Disposition Plan: Home likely in 2 to 3 days pending renal improvement.  Follow nephrology recommendations  Consultants:  Nephrology  Procedures:  None  Antibiotics: Anti-infectives (From admission, onward)   None     Subjective: Patient stated that he was extremely nauseated yesterday after pamidronate.  Currently feels okay.  Appetite has improved.  Has been urinating pretty good.  Denies any dyspnea chest pain palpitation fever chills.  Objective: Vitals:   11/22/18 2000 11/23/18 0526  BP: (!)  142/94 118/83  Pulse: (!) 44 87  Resp: 18 18  Temp: 98.2 F (36.8 C) 98.6 F (37 C)  SpO2: 96% 100%    Intake/Output Summary (Last 24 hours) at 11/23/2018 0729 Last data filed at 11/23/2018 0651 Gross per 24 hour  Intake 2362.19 ml  Output 4425 ml  Net -2062.81 ml   Filed Weights   11/22/18 0225 11/23/18 0526  Weight: 89.7 kg 91.3 kg   Body mass index is 27.3 kg/m.   Physical Exam: General:  Average built, not in obvious distress, lying on his side flat in bed HENT: Normocephalic, pupils equally reacting to light and accommodation.  No scleral pallor or icterus noted. Oral mucosa is moist.  Chest:    Diminished breath sounds bilaterally. No crackles or wheezes.  CVS: S1 &S2 heard. No murmur.  Regular rate and rhythm. Abdomen: Soft, nontender, nondistended.  Bowel sounds are heard.  Liver is not palpable, no abdominal mass palpated Extremities: No cyanosis, clubbing..  Peripheral pulses are palpable. Psych: Alert, awake and oriented, normal mood CNS:  No cranial nerve deficits.  Power equal in all extremities.  No sensory deficits noted.  No cerebellar signs.   Skin: Warm and dry.  No rashes noted.   Data Review: I have personally reviewed the following laboratory data and studies,  CBC: Recent Labs  Lab 11/20/18 2325 11/22/18 0420  WBC 14.1* 8.6  NEUTROABS 10.9*  --   HGB 13.4 10.9*  HCT 38.8* 31.9*  MCV 81.3 82.6  PLT 457* [id number]   Basic Metabolic Panel: Recent Labs  Lab 11/21/18 0923 11/21/18 1604 11/21/18 2155 11/22/18 0420 11/23/18 0404  NA 130* 129* 132* 132* 135  K 5.0 4.7 4.8 4.7 4.7  CL 92* 92* 98 100 107  CO2 19* 21* 22 20* 18*  GLUCOSE 100* 100* 99 114* 91  BUN 124* 116* 114* 110* 93*  CREATININE 10.32* 10.39* 10.05* 9.54* 8.37*  CALCIUM 13.7*  13.0* 12.8* 12.7* 12.1* 10.3  MG  --   --   --  2.3  --   PHOS 8.0*  --   --  8.3* 6.1*   Liver Function Tests: Recent Labs  Lab 11/20/18 2325 11/21/18 0923 11/23/18 0404  AST 16 17  --   ALT 32 30   --   ALKPHOS 84 82  --   BILITOT 0.8 0.8  --   PROT 8.4* 8.0  --   ALBUMIN 4.4 4.0 3.5   Recent Labs  Lab 11/20/18 2325  LIPASE 58*   No results for input(s): AMMONIA in the last 168 hours. Cardiac Enzymes: Recent Labs  Lab 11/22/18 0420  CKTOTAL 92   BNP (last 3 results) No results for input(s): BNP in the last 8760 hours.  ProBNP (last 3 results) No results for input(s): PROBNP in the last 8760 hours.  CBG: No results for input(s): GLUCAP in the last 168 hours. Recent Results (from the past 240 hour(s))  SARS CORONAVIRUS 2 (TAT 6-24 HRS) Nasopharyngeal Nasopharyngeal Swab     Status: None   Collection Time: 11/21/18  8:50 PM   Specimen: Nasopharyngeal Swab  Result  Value Ref Range Status   SARS Coronavirus 2 NEGATIVE NEGATIVE Final    Comment: (NOTE) SARS-CoV-2 target nucleic acids are NOT DETECTED. The SARS-CoV-2 RNA is generally detectable in upper and lower respiratory specimens during the acute phase of infection. Negative results do not preclude SARS-CoV-2 infection, do not rule out co-infections with other pathogens, and should not be used as the sole basis for treatment or other patient management decisions. Negative results must be combined with clinical observations, patient history, and epidemiological information. The expected result is Negative. Fact Sheet for Patients: SugarRoll.be Fact Sheet for Healthcare Providers: https://www.woods-mathews.com/ This test is not yet approved or cleared by the [place] FDA and  has been authorized for detection and/or diagnosis of SARS-CoV-2 by FDA under an Emergency Use Authorization (EUA). This EUA will remain  in effect (meaning this test can be used) for the duration of the COVID-19 declaration under Section 56 4(b)(1) of the Act, 21 U.S.C. section 360bbb-3(b)(1), unless the authorization is terminated or revoked sooner. Performed at [facility], [place]  [address] [place], [place] [address]      Studies: US Renal  Result Date: 11/22/2018 CLINICAL DATA:  Acute kidney injury. EXAM: RENAL / URINARY TRACT ULTRASOUND COMPLETE COMPARISON:  CT scan of November 21, 2018. FINDINGS: Right Kidney: Renal measurements: 11.6 x 6.1 x 5.0 cm = volume: 183 mL. Increased echogenicity of renal parenchyma is noted. No mass or hydronephrosis visualized. Left Kidney: Renal measurements: 13.2 x 6.1 x 5.4 cm = volume: 229 mL. Increased echogenicity of renal parenchyma is noted. No mass or hydronephrosis visualized. Bladder: Appears normal for degree of bladder distention. IMPRESSION: Increased echogenicity of renal parenchyma is noted bilaterally suggesting medical renal disease. No hydronephrosis or renal obstruction is noted. Electronically Signed   By: Marijo Conception M.D.   On: 11/22/2018 14:24    Scheduled Meds: . calcitonin  4 Units/kg Subcutaneous BID    Continuous Infusions: . sodium chloride 200 mL/hr at 11/23/18 0236     Flora Lipps, MD  [facility] 11/23/2018

## 2018-11-23 NOTE — Progress Notes (Signed)
Patient refused his 10 am calcitonin injection, Dr Louanne Belton  Notified. Will keep monitoring.

## 2018-11-23 NOTE — Progress Notes (Signed)
[facility] Progress Note  Subjective: feeling better  Vitals:   11/22/18 1420 11/22/18 1631 11/22/18 2000 11/23/18 0526  BP: (!) 138/95  (!) 142/94 118/83  Pulse: 73  (!) 44 87  Resp: 20  18 18   Temp: 97.9 F (36.6 C) 98 F (36.7 C) 98.2 F (36.8 C) 98.6 F (37 C)  TempSrc: Oral Oral Oral   SpO2: 100%  96% 100%  Weight:    91.3 kg  Height:        Inpatient medications:  . sodium chloride 200 mL/hr at 11/23/18 1316   acetaminophen **OR** acetaminophen, baclofen, ondansetron, sodium chloride flush    Exam: Gen looks better, pleasant and Ox3 No rash, cyanosis or gangrene Sclera anicteric, throat clear  No jvd or bruits Chest clear bilat to bases RRR no MRG Abd soft ntnd no mass or ascites +bs GU normal male MS no joint effusions or deformity Ext no LE or UE edema, no wounds or ulcers Neuro is alert, Ox 3 , nf    Home meds:  - baclofen 5-10 mg prn/ ibuprofen 200 qid prn/ nabumetone 750 bid prn/ hydrocodone-acetaminophen qid prn/ ondansetron 4mg  tid prn - magnesium salicylate prn po - methylprednisolone dosepak 4mg  x 6 days ut dict   Renal US > 13.4 and 11.6 cm kidneys , no hydro, ^'d echo bilat    Assessment/ Plan: 1. Renal failure - associated w/ hypercalcemia and possible volume depletion related to severe back pain and poor po intake.  With aggressive hydration and calcitonin (and pamidronate x 1), calcium is significantly better and creatinine down overnight. No signs of obstruction on CT or [place].  Kidneys are normal size.  UA not suggestive of a GN. Not sure cause of AKI yet, await results of myeloma w/u. Consider other malignancy w/u given unexplained hypercalcemia.  Will d/w primary.  2. Back pain - +compression fractures by recent MRI, no LE weakness. Pain better controlled today.  3. Hypercalcemia -  Low pth and low vit 25 D. Unclear cause. Better w/ Rx. Can lower IVF's to 125 cc/ hr today.     [facility] 11/23/2018, 2:32 PM  Iron/TIBC/Ferritin/ %Sat No results found for: IRON, TIBC, FERRITIN, IRONPCTSAT Recent Labs  Lab 11/23/18 0404  NA 135  K 4.7  CL 107  CO2 18*  GLUCOSE 91  BUN 93*  CREATININE 8.37*  CALCIUM 10.3  PHOS 6.1*  ALBUMIN 3.5   Recent Labs  Lab 11/21/18 0923  AST 17  ALT 30  ALKPHOS 82  BILITOT 0.8  PROT 8.0   Recent Labs  Lab 11/22/18 0420  WBC 8.6  HGB 10.9*  HCT 31.9*  PLT 390

## 2018-11-24 ENCOUNTER — Inpatient Hospital Stay (HOSPITAL_COMMUNITY): Payer: BC Managed Care – PPO

## 2018-11-24 DIAGNOSIS — N179 Acute kidney failure, unspecified: Secondary | ICD-10-CM

## 2018-11-24 DIAGNOSIS — C9000 Multiple myeloma not having achieved remission: Secondary | ICD-10-CM

## 2018-11-24 DIAGNOSIS — M899 Disorder of bone, unspecified: Secondary | ICD-10-CM

## 2018-11-24 LAB — PROTEIN ELECTRO, RANDOM URINE
Albumin ELP, Urine: 4.5 %
Alpha-1-Globulin, U: 0.3 %
Alpha-2-Globulin, U: 1.3 %
Beta Globulin, U: 3.6 %
Gamma Globulin, U: 90.3 %
M Component, Ur: 57.7 % — ABNORMAL HIGH
Total Protein, Urine: 118.2 mg/dL

## 2018-11-24 LAB — RENAL FUNCTION PANEL
Albumin: 3.3 g/dL — ABNORMAL LOW (ref 3.5–5.0)
Anion gap: 9 (ref 5–15)
BUN: 66 mg/dL — ABNORMAL HIGH (ref 6–20)
CO2: 18 mmol/L — ABNORMAL LOW (ref 22–32)
Calcium: 10.7 mg/dL — ABNORMAL HIGH (ref 8.9–10.3)
Chloride: 108 mmol/L (ref 98–111)
Creatinine, Ser: 6.89 mg/dL — ABNORMAL HIGH (ref 0.61–1.24)
GFR calc Af Amer: 10 mL/min — ABNORMAL LOW (ref >60)
GFR calc non Af Amer: 9 mL/min — ABNORMAL LOW (ref >60)
Glucose, Bld: 97 mg/dL (ref 70–99)
Phosphorus: 5.2 mg/dL — ABNORMAL HIGH (ref 2.5–4.6)
Potassium: 4.8 mmol/L (ref 3.5–5.1)
Sodium: 135 mmol/L (ref 135–145)

## 2018-11-24 LAB — MULTIPLE MYELOMA PANEL, SERUM
Albumin SerPl Elph-Mcnc: 3.4 g/dL (ref 2.9–4.4)
Albumin/Glob SerPl: 1.3 (ref 0.7–1.7)
Alpha 1: 0.2 g/dL (ref 0.0–0.4)
Alpha2 Glob SerPl Elph-Mcnc: 1.1 g/dL — ABNORMAL HIGH (ref 0.4–1.0)
B-Globulin SerPl Elph-Mcnc: 0.9 g/dL (ref 0.7–1.3)
Gamma Glob SerPl Elph-Mcnc: 0.5 g/dL (ref 0.4–1.8)
Globulin, Total: 2.7 g/dL (ref 2.2–3.9)
IgA: 34 mg/dL — ABNORMAL LOW (ref 90–386)
IgG (Immunoglobin G), Serum: 534 mg/dL — ABNORMAL LOW (ref 603–1613)
IgM (Immunoglobulin M), Srm: 15 mg/dL — ABNORMAL LOW (ref 20–172)
M Protein SerPl Elph-Mcnc: 0.2 g/dL — ABNORMAL HIGH
Total Protein ELP: 6.1 g/dL (ref 6.0–8.5)

## 2018-11-24 LAB — CBC
HCT: 30.2 % — ABNORMAL LOW (ref 39.0–52.0)
Hemoglobin: 10.2 g/dL — ABNORMAL LOW (ref 13.0–17.0)
MCH: 28.1 pg (ref 26.0–34.0)
MCHC: 33.8 g/dL (ref 30.0–36.0)
MCV: 83.2 fL (ref 80.0–100.0)
Platelets: 328 10*3/uL (ref 150–400)
RBC: 3.63 MIL/uL — ABNORMAL LOW (ref 4.22–5.81)
RDW: 12.9 % (ref 11.5–15.5)
WBC: 5.7 10*3/uL (ref 4.0–10.5)
nRBC: 0 % (ref 0.0–0.2)

## 2018-11-24 MED ORDER — ACETAMINOPHEN 500 MG PO TABS
1000.0000 mg | ORAL_TABLET | Freq: Four times a day (QID) | ORAL | Status: DC | PRN
  Administered 2018-11-25 – 2018-12-04 (×11): 1000 mg via ORAL
  Filled 2018-11-24 (×11): qty 2

## 2018-11-24 MED ORDER — DEXAMETHASONE 6 MG PO TABS
40.0000 mg | ORAL_TABLET | Freq: Every day | ORAL | Status: AC
  Administered 2018-11-24 – 2018-11-27 (×4): 40 mg via ORAL
  Filled 2018-11-24 (×5): qty 1

## 2018-11-24 MED ORDER — CYCLOPHOSPHAMIDE 50 MG PO CAPS
300.0000 mg | ORAL_CAPSULE | Freq: Once | ORAL | Status: AC
  Administered 2018-11-25: 300 mg via ORAL
  Filled 2018-11-24: qty 6

## 2018-11-24 MED ORDER — METOPROLOL TARTRATE 5 MG/5ML IV SOLN
2.5000 mg | Freq: Once | INTRAVENOUS | Status: AC
  Administered 2018-11-24: 2.5 mg via INTRAVENOUS
  Filled 2018-11-24: qty 5

## 2018-11-24 MED ORDER — CYCLOBENZAPRINE HCL 5 MG PO TABS
5.0000 mg | ORAL_TABLET | Freq: Once | ORAL | Status: AC
  Administered 2018-11-24: 5 mg via ORAL
  Filled 2018-11-24: qty 1

## 2018-11-24 MED ORDER — METOPROLOL TARTRATE 5 MG/5ML IV SOLN
5.0000 mg | Freq: Once | INTRAVENOUS | Status: AC
  Administered 2018-11-24: 5 mg via INTRAVENOUS
  Filled 2018-11-24: qty 5

## 2018-11-24 MED ORDER — LIDOCAINE 5 % EX PTCH
1.0000 | MEDICATED_PATCH | CUTANEOUS | Status: DC
  Filled 2018-11-24 (×11): qty 1

## 2018-11-24 MED ORDER — HYDRALAZINE HCL 20 MG/ML IJ SOLN
10.0000 mg | Freq: Four times a day (QID) | INTRAMUSCULAR | Status: DC | PRN
  Filled 2018-11-24: qty 1

## 2018-11-24 MED ORDER — SODIUM CHLORIDE 0.9 % IV BOLUS
500.0000 mL | Freq: Once | INTRAVENOUS | Status: AC
  Administered 2018-11-24: 500 mL via INTRAVENOUS

## 2018-11-24 MED ORDER — PALONOSETRON HCL INJECTION 0.25 MG/5ML
0.2500 mg | Freq: Once | INTRAVENOUS | Status: DC
  Filled 2018-11-24: qty 5

## 2018-11-24 MED ORDER — OXYCODONE HCL 5 MG PO TABS
5.0000 mg | ORAL_TABLET | ORAL | Status: DC | PRN
  Administered 2018-11-28 – 2018-12-04 (×19): 5 mg via ORAL
  Filled 2018-11-24 (×20): qty 1

## 2018-11-24 MED ORDER — HYDROMORPHONE HCL 1 MG/ML IJ SOLN
0.5000 mg | INTRAMUSCULAR | Status: DC | PRN
  Administered 2018-11-24 – 2018-11-29 (×13): 0.5 mg via INTRAVENOUS
  Filled 2018-11-24 (×17): qty 0.5

## 2018-11-24 MED ORDER — ACETAMINOPHEN 500 MG PO TABS
1000.0000 mg | ORAL_TABLET | Freq: Once | ORAL | Status: AC
  Administered 2018-11-24: 325 mg via ORAL

## 2018-11-24 MED ORDER — BORTEZOMIB CHEMO SQ INJECTION 3.5 MG (2.5MG/ML)
1.3000 mg/m2 | Freq: Once | INTRAMUSCULAR | Status: AC
  Administered 2018-11-24: 2.75 mg via SUBCUTANEOUS
  Filled 2018-11-24: qty 1.1

## 2018-11-24 NOTE — Progress Notes (Signed)
MEWS Red due to HR and fever. Ordered a 531ml NS Bolus, 1gm of Tylenol PO, and Metoprolol 2.5mg  IV x 1.  Arby Barrette APRN-C [facility] Pager [phone]

## 2018-11-24 NOTE — Progress Notes (Signed)
Consent form has been sighed by Derek [name] for the administration for Velcade sub-q and cyclophosphamide PO 11-25-18.

## 2018-11-24 NOTE — Progress Notes (Signed)
   11/24/18 0534  Vitals  Temp (!) 102.9 F (39.4 C)  Temp Source Oral  BP (!) 162/87  MAP (mmHg) 105  BP Location Left Arm  BP Method Automatic  Patient Position (if appropriate) Lying  Pulse Rate (!) 111  Pulse Rate Source Monitor  Resp 20  Oxygen Therapy  SpO2 97 %  O2 Device Room Air  Height and Weight  Weight 94.5 kg  BMI (Calculated) 28.24  MEWS Score  MEWS RR 0  MEWS Pulse 2  MEWS Systolic 0  MEWS LOC 0  MEWS Temp 2  MEWS Score 4  MEWS Score Color Red   MEWS red. Notified Bodenheimer, NP of patient's vitals and red mews status. Tylenol given. Lopressor given. Fluid bolus started. Rechecking vitals more frequently per protocol. Will continue to monitor.

## 2018-11-24 NOTE — Progress Notes (Signed)
Ok to treat w/ current SCr per Dr. Benay Spice.  Kennith Center, Pharm.D., CPP 11/24/2018@4 :33 PM
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
## 2018-11-24 NOTE — Progress Notes (Signed)
[facility] Progress Note  Subjective: feeling ok, creat down to 6.  Serum free LC's show Juliette Alcide.   Vitals:   11/24/18 0900 11/24/18 0915 11/24/18 1015 11/24/18 1115  BP: (!) 141/79 136/86 (!) 154/87 (!) 137/91  Pulse: (!) 103 95 (!) 115 96  Resp:  16 16 16   Temp:  99.2 F (37.3 C) 98.9 F (37.2 C) 99.1 F (37.3 C)  TempSrc:  Oral  Oral  SpO2:  98% 98% 98%  Weight:      Height:        Inpatient medications: . bortezomib SQ  1.3 mg/m2 (Treatment Plan Recorded) Subcutaneous Once  . palonosetron  0.25 mg Intravenous Once   . sodium chloride 125 mL/hr at 11/24/18 0251   acetaminophen **OR** acetaminophen, baclofen, hydrALAZINE, ondansetron, sodium chloride flush    Exam: Gen looks better, pleasant and Ox3 No rash, cyanosis or gangrene Sclera anicteric, throat clear  No jvd or bruits Chest clear bilat to bases RRR no MRG Abd soft ntnd no mass or ascites +bs GU normal male MS no joint effusions or deformity Ext no LE or UE edema, no wounds or ulcers Neuro is alert, Ox 3 , nf    Home meds:  - baclofen 5-10 mg prn/ ibuprofen 200 qid prn/ nabumetone 750 bid prn/ hydrocodone-acetaminophen qid prn/ ondansetron 4mg  tid prn - magnesium salicylate prn po - methylprednisolone dosepak 4mg  x 6 days ut dict   Renal US > 13.4 and 11.6 cm kidneys , no hydro, ^'d echo bilat    Assessment/ Plan: 1. Renal failure - associated w/ hypercalcemia, back pain / compression fractures and now +test for ^^LC's. Oncology has been consulted. 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 Cont IVF's 125 cc/hr. Creat improving.   2. Hypercalcemia - better, prob due to mult myeloma. Appreciate ONC assistance. Ca++ back down now.  3. Ralene Bathe free light chains - ONC evaluating for suspected [facility] 11/24/2018, 4:48 PM  Iron/TIBC/Ferritin/ %Sat No results found for: IRON, TIBC, FERRITIN, IRONPCTSAT Recent Labs  Lab  11/24/18 0408  NA 135  K 4.8  CL 108  CO2 18*  GLUCOSE 97  BUN 66*  CREATININE 6.89*  CALCIUM 10.7*  PHOS 5.2*  ALBUMIN 3.3*   Recent Labs  Lab 11/21/18 0923  AST 17  ALT 30  ALKPHOS 82  BILITOT 0.8  PROT 8.0   Recent Labs  Lab 11/24/18 0408  WBC 5.7  HGB 10.2*  HCT 30.2*  PLT 328

## 2018-11-24 NOTE — Progress Notes (Signed)
PROGRESS NOTE  Derek [name] [id number] DOB: 1975/10/21 DOA: 11/20/2018 PCP: Posey Boyer, MD   LOS: 3 days   Brief narrative:  Derek [name] is a 43 y.o. male with no significant past medical history who has been having increasing low back pain and has been following up with orthopedic surgeon was prescribed steroid Dosepak about 3 weeks ago as per the patient following which he was prescribed Voltaren for few days and also had an MRI which showed some disc bulge around L4-L5 and compression fracture around T12-L1.  Since pain was not improving, he was placed on nabumetone which patient took at least 3-4 doses.  Patient eventually started having some nausea.  Over the last 1 week because of nausea and appetite has decreased.  But still was able to drink some fluid and Gatorade.  Since patient's symptoms did not improve patient came to ER.  Denies any weakness of the lower extremity.  Pain is mostly in the low back.  In the ER patient's labs show creatinine of 10.8 BUN 123 lipase 58 WBC count 14.1 total protein 8.4 calcium 13.6 albumin 4.4 UA shows 100 protein rare bacteria moderate hemoglobin but negative for RBC WBC.  CT renal study does not show anything acute in the abdominal pelvis area.  This shows marrow features concerning for metabolic changes.  No obstruction seen in the kidneys.  Patient is being admitted for acute renal failure with hypercalcemia.  COVID-19 test was negative.  Assessment/Plan:  Principal Problem:   ARF (acute renal failure) (HCC) Active Problems:   Low back pain   Hypercalcemia   Acute renal failure (ARF) (HCC)   AKI (acute kidney injury) ([place])  Severe acute kidney injury initially thought to be secondary to volume depletion/NSAID usage.  Extensive work-up was sent.  Blood lambda/Kappa light chains are elevated.  Patient did have fever 11/24/2018 and chest x-ray was done which showed destructive lesion in the right third rib.  This is highly suggestive  of  myeloma kidney..  I spoke with nephrology about it and will get hemato-oncology consultation.  Spoke with Dr. Learta Codding about this finding.  Oncology will be consulting for further input and management.   Creatinine has improved from 10.8 on presentation to 6.8 today. Receiving IV fluids with good diuresis. Strictly monitor intake and output charting.  Negative balance documented in the computer. CT scan of the abdomen did not show any evidence of hydronephrosis or obstruction, but nonobstructive calculus.  HIV nonreactive.  Urine shows mild proteinuria and dipstick was positive for blood.  Urine RBC 0-5.  CK level was 92.   New fever T-max of 102.9 F.  No leukocytosis.  Denies localizing signs and symptoms of infection.  COVID-19 was negative.  Blood cultures and urine culture have been sent.  Chest x-ray was obtained without any infiltrate but destructive right third rib lesion.  T12-L1 compression fracture.  Abnormal bone density on the CT scan.  Patient does have recent trauma as well. Possibility of myeloma bone disease and pathological fracture  Hyponatremia.  Improved.  Abnormal density of the spine on CT scan. PTH was not elevated.  Vitamin D was also low.  Likely secondary to myeloma.  Hypercalcemia likely secondary to myeloma- has improved with IV fluid hydration, pamidronate and calcitonin.    Leukocytosis reactive  Back pain and neck spasms.  Patient had seen orthopedics as outpatient.  Continue baclofen for back spasms.  Patient recently had lumbar spine MRI.  Mild anemia.  Will closely monitor.  No evidence of blood loss.  Could be secondary to myeloma but is not as pronounced as thought to be.  VTE Prophylaxis: SCD,   Code Status: Full code  Family Communication: None today.  Patient did not want me to speak to his significant other  Disposition Plan: Home likely in 2 to 3 days pending renal improvement and oncology opinion..    Consultants:  Nephrology  Oncology.   Dr. Learta Codding  Procedures:  None  Antibiotics: Anti-infectives (From admission, onward)   None     Subjective: Patient was noted to have fever, denies dyspnea. No nausea, vomiting today. Denies abdominal pain. Urinating ok. Denies dysuria.  Denies rib pain.  Objective: Vitals:   11/24/18 1015 11/24/18 1115  BP: (!) 154/87 (!) 137/91  Pulse: (!) 115 96  Resp: 16 16  Temp: 98.9 F (37.2 C) 99.1 F (37.3 C)  SpO2: 98% 98%    Intake/Output Summary (Last 24 hours) at 11/24/2018 1209 Last data filed at 11/24/2018 0919 Gross per 24 hour  Intake 600 ml  Output 5970 ml  Net -5370 ml   Filed Weights   11/22/18 0225 11/23/18 0526 11/24/18 0534  Weight: 89.7 kg 91.3 kg 94.5 kg   Body mass index is 28.25 kg/m.   Physical Exam: General:  Average built, not in obvious distress HENT: Normocephalic, pupils equally reacting to light and accommodation.  No scleral pallor or icterus noted. Oral mucosa is moist.  Chest:    Diminished breath sounds bilaterally. No crackles or wheezes.  CVS: S1 &S2 heard. No murmur.  Regular rate and rhythm. Abdomen: Soft, nontender, nondistended.  Bowel sounds are heard.  Liver is not palpable, no abdominal mass palpated Extremities: No cyanosis, clubbing..  Peripheral pulses are palpable. Psych: Alert, awake and oriented, normal mood CNS:  No cranial nerve deficits.  Power equal in all extremities.  No sensory deficits noted.  No cerebellar signs.   Skin: Warm and dry.  No rashes noted.   Data Review: I have personally reviewed the following laboratory data and studies,  CBC: Recent Labs  Lab 11/20/18 2325 11/22/18 0420 11/24/18 0408  WBC 14.1* 8.6 5.7  NEUTROABS 10.9*  --   --   HGB 13.4 10.9* 10.2*  HCT 38.8* 31.9* 30.2*  MCV 81.3 82.6 83.2  PLT 457* 390 [id number]   Basic Metabolic Panel: Recent Labs  Lab 11/21/18 0923 11/21/18 1604 11/21/18 2155 11/22/18 0420 11/23/18 0404 11/24/18 0408  NA 130* 129* 132* 132* 135 135  K 5.0 4.7 4.8 4.7  4.7 4.8  CL 92* 92* 98 100 107 108  CO2 19* 21* 22 20* 18* 18*  GLUCOSE 100* 100* 99 114* 91 97  BUN 124* 116* 114* 110* 93* 66*  CREATININE 10.32* 10.39* 10.05* 9.54* 8.37* 6.89*  CALCIUM 13.7*  13.0* 12.8* 12.7* 12.1* 10.3 10.7*  MG  --   --   --  2.3  --   --   PHOS 8.0*  --   --  8.3* 6.1* 5.2*   Liver Function Tests: Recent Labs  Lab 11/20/18 2325 11/21/18 0923 11/23/18 0404 11/24/18 0408  AST 16 17  --   --   ALT 32 30  --   --   ALKPHOS 84 82  --   --   BILITOT 0.8 0.8  --   --   PROT 8.4* 8.0  --   --   ALBUMIN 4.4 4.0 3.5 3.3*   Recent Labs  Lab 11/20/18 2325  LIPASE 58*  No results for input(s): AMMONIA in the last 168 hours. Cardiac Enzymes: Recent Labs  Lab 11/22/18 0420  CKTOTAL 92   BNP (last 3 results) No results for input(s): BNP in the last 8760 hours.  ProBNP (last 3 results) No results for input(s): PROBNP in the last 8760 hours.  CBG: No results for input(s): GLUCAP in the last 168 hours. Recent Results (from the past 240 hour(s))  SARS CORONAVIRUS 2 (TAT 6-24 HRS) Nasopharyngeal Nasopharyngeal Swab     Status: None   Collection Time: 11/21/18  8:50 PM   Specimen: Nasopharyngeal Swab  Result Value Ref Range Status   SARS Coronavirus 2 NEGATIVE NEGATIVE Final    Comment: (NOTE) SARS-CoV-2 target nucleic acids are NOT DETECTED. The SARS-CoV-2 RNA is generally detectable in upper and lower respiratory specimens during the acute phase of infection. Negative results do not preclude SARS-CoV-2 infection, do not rule out co-infections with other pathogens, and should not be used as the sole basis for treatment or other patient management decisions. Negative results must be combined with clinical observations, patient history, and epidemiological information. The expected result is Negative. Fact Sheet for Patients: SugarRoll.be Fact Sheet for Healthcare Providers: https://www.woods-mathews.com/ This  test is not yet approved or cleared by the [place] FDA and  has been authorized for detection and/or diagnosis of SARS-CoV-2 by FDA under an Emergency Use Authorization (EUA). This EUA will remain  in effect (meaning this test can be used) for the duration of the COVID-19 declaration under Section 56 4(b)(1) of the Act, 21 U.S.C. section 360bbb-3(b)(1), unless the authorization is terminated or revoked sooner. Performed at [facility], [place] [address], [place], [place] [address]      Studies: US Renal  Result Date: 11/22/2018 CLINICAL DATA:  Acute kidney injury. EXAM: RENAL / URINARY TRACT ULTRASOUND COMPLETE COMPARISON:  CT scan of November 21, 2018. FINDINGS: Right Kidney: Renal measurements: 11.6 x 6.1 x 5.0 cm = volume: 183 mL. Increased echogenicity of renal parenchyma is noted. No mass or hydronephrosis visualized. Left Kidney: Renal measurements: 13.2 x 6.1 x 5.4 cm = volume: 229 mL. Increased echogenicity of renal parenchyma is noted. No mass or hydronephrosis visualized. Bladder: Appears normal for degree of bladder distention. IMPRESSION: Increased echogenicity of renal parenchyma is noted bilaterally suggesting medical renal disease. No hydronephrosis or renal obstruction is noted. Electronically Signed   By: Marijo Conception M.D.   On: 11/22/2018 14:24   Dg Chest Port 1 View  Result Date: 11/24/2018 CLINICAL DATA:  Fever. EXAM: PORTABLE CHEST 1 VIEW COMPARISON:  Rib radiographs 11/17/2018 FINDINGS: The cardiac silhouette is borderline enlarged. The lungs are mildly hypoinflated with similar appearance of mild interstitial coarsening. No confluent airspace opacity, overt pulmonary edema, sizable pleural effusion, or pneumothorax is identified. A soft tissue mass and associated destruction of the right lateral third rib are again seen. IMPRESSION: 1. Mild hypoinflation.  No consolidation. 2. Destructive mass involving the right lateral third rib. Electronically Signed   By:  Logan Bores M.D.   On: 11/24/2018 10:04    Scheduled Meds:   Continuous Infusions: . sodium chloride 125 mL/hr at 11/24/18 0251     Flora Lipps, MD  [facility] 11/24/2018

## 2018-11-25 LAB — CBC
HCT: 30.5 % — ABNORMAL LOW (ref 39.0–52.0)
Hemoglobin: 9.8 g/dL — ABNORMAL LOW (ref 13.0–17.0)
MCH: 27.5 pg (ref 26.0–34.0)
MCHC: 32.1 g/dL (ref 30.0–36.0)
MCV: 85.7 fL (ref 80.0–100.0)
Platelets: 278 10*3/uL (ref 150–400)
RBC: 3.56 MIL/uL — ABNORMAL LOW (ref 4.22–5.81)
RDW: 13.2 % (ref 11.5–15.5)
WBC: 5.2 10*3/uL (ref 4.0–10.5)
nRBC: 0 % (ref 0.0–0.2)

## 2018-11-25 LAB — RENAL FUNCTION PANEL
Albumin: 3.2 g/dL — ABNORMAL LOW (ref 3.5–5.0)
Anion gap: 12 (ref 5–15)
BUN: 59 mg/dL — ABNORMAL HIGH (ref 6–20)
CO2: 15 mmol/L — ABNORMAL LOW (ref 22–32)
Calcium: 10 mg/dL (ref 8.9–10.3)
Chloride: 109 mmol/L (ref 98–111)
Creatinine, Ser: 6.06 mg/dL — ABNORMAL HIGH (ref 0.61–1.24)
GFR calc Af Amer: 12 mL/min — ABNORMAL LOW (ref >60)
GFR calc non Af Amer: 10 mL/min — ABNORMAL LOW (ref >60)
Glucose, Bld: 198 mg/dL — ABNORMAL HIGH (ref 70–99)
Phosphorus: 5 mg/dL — ABNORMAL HIGH (ref 2.5–4.6)
Potassium: 5 mmol/L (ref 3.5–5.1)
Sodium: 136 mmol/L (ref 135–145)

## 2018-11-25 LAB — URINE CULTURE: Culture: 60000 — AB

## 2018-11-25 LAB — PHOSPHORUS: Phosphorus: 4.8 mg/dL — ABNORMAL HIGH (ref 2.5–4.6)

## 2018-11-25 LAB — MAGNESIUM: Magnesium: 2.1 mg/dL (ref 1.7–2.4)

## 2018-11-25 MED ORDER — STERILE WATER FOR INJECTION IV SOLN
INTRAVENOUS | Status: DC
  Administered 2018-11-25 – 2018-11-28 (×5): via INTRAVENOUS
  Filled 2018-11-25 (×12): qty 850

## 2018-11-25 MED ORDER — SODIUM BICARBONATE 650 MG PO TABS
650.0000 mg | ORAL_TABLET | Freq: Two times a day (BID) | ORAL | Status: DC
  Administered 2018-11-25 – 2018-11-27 (×5): 650 mg via ORAL
  Filled 2018-11-25 (×5): qty 1

## 2018-11-25 NOTE — Progress Notes (Addendum)
[facility] Progress Note  Subjective: feeling ok, creat down 6.8 > 6.1  Vitals:   11/25/18 0012 11/25/18 0409 11/25/18 0500 11/25/18 1006  BP: 122/79 124/83  134/83  Pulse: (!) 130 78  66  Resp: _0 Temp: 98.2 F (36.8 C) 97.9 F (36.6 C)  97.7 F (36.5 C)  TempSrc: Oral Oral  Oral  SpO2: 97% 100%  100%  Weight:   95.3 kg   Height:        Inpatient medications: . cyclophosphamide  300 mg Oral Once  . dexamethasone  40 mg Oral Daily  . lidocaine  1 patch Transdermal Q24H  . sodium bicarbonate  650 mg Oral BID   .  sodium bicarbonate (isotonic) infusion in sterile water     acetaminophen, baclofen, hydrALAZINE, HYDROmorphone (DILAUDID) injection, ondansetron, oxyCODONE, sodium chloride flush    Exam: Gen looks better, pleasant and Ox3 No rash, cyanosis or gangrene Sclera anicteric, throat clear  No jvd or bruits Chest clear bilat to bases RRR no MRG Abd soft ntnd no mass or ascites +bs GU normal male MS no joint effusions or deformity Ext no LE or UE edema, no wounds or ulcers Neuro is alert, Ox 3 , nf    Home meds:  - baclofen 5-10 mg prn/ ibuprofen 200 qid prn/ nabumetone 750 bid prn/ hydrocodone-acetaminophen qid prn/ ondansetron 58m tid prn - magnesium salicylate prn po - methylprednisolone dosepak 441mx 6 days ut dict   Renal [place] 13.4 and 11.6 cm kidneys , no hydro, ^'d echo bilat    Assessment/ Plan: 1. 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 Creat declining down to 6.1 today. No uremic symptoms, no indication for dialysis yet. Oncology is starting pt on chemoRx w/ velcade/ dexamethasone/ Cytoxan. Will follow.  2. Hypercalcemia - better. Appreciate ONC assistance. Improved.  3. ^^Ralene Batheree light chains - per ONC as above 4. Volume - euvolemic on exam 5. Met acidosis - have changed IVFs to bicarb gtt and added bicarb tabs     Rob [facility] 11/25/2018, 11:32 AM  Iron/TIBC/Ferritin/ %Sat No  results found for: IRON, TIBC, FERRITIN, IRONPCTSAT Recent Labs  Lab 11/25/18 0405  NA 136  K 5.0  CL 109  CO2 15*  GLUCOSE 198*  BUN 59*  CREATININE 6.06*  CALCIUM 10.0  PHOS 4.8*  5.0*  ALBUMIN 3.2*   Recent Labs  Lab 11/21/18 0923  AST 17  ALT 30  ALKPHOS 82  BILITOT 0.8  PROT 8.0   Recent Labs  Lab 11/25/18 0405  WBC 5.2  HGB 9.8*  HCT 30.5*  PLT 278

## 2018-11-25 NOTE — Progress Notes (Signed)
PROGRESS NOTE  Derek [name] JDB:[id number] DOB: 25-Apr-1975 DOA: 11/20/2018 PCP: Posey Boyer, MD   LOS: 4 days   Brief narrative:  Derek [name] is a 43 y.o. male with no significant past medical history who has been having increasing low back pain and has been following up with orthopedic surgeon was prescribed steroid Dosepak about 3 weeks ago as per the patient following which he was prescribed Voltaren for few days and also had an MRI which showed some disc bulge around L4-L5 and compression fracture around T12-L1.  Since pain was not improving, he was placed on nabumetone which patient took at least 3-4 doses.  Patient eventually started having some nausea.  Over the last 1 week because of nausea and appetite has decreased.  But still was able to drink some fluid and Gatorade.  Since patient's symptoms did not improve patient came to ER.  Denies any weakness of the lower extremity.  Pain is mostly in the low back.  In the ER patient's labs show creatinine of 10.8 BUN 123 lipase 58 WBC count 14.1 total protein 8.4 calcium 13.6 albumin 4.4 UA shows 100 protein rare bacteria moderate hemoglobin but negative for RBC WBC.  CT renal study does not show anything acute in the abdominal pelvis area.  This shows marrow features concerning for metabolic changes.  No obstruction seen in the kidneys.  Patient is being admitted for acute renal failure with hypercalcemia.  COVID-19 test was negative.  11/25/2018: Patient seen.  Renal function continues to improve.  Serum creatinine today 6.06.  BUN is 59.  Calcium is down to 10.  Patient has started chemotherapy.  Input from oncology and nephrology is appreciated.  Assessment/Plan:  Principal Problem:   ARF (acute renal failure) (HCC) Active Problems:   Low back pain   Hypercalcemia   Acute renal failure (ARF) ([place])   AKI (acute kidney injury) ([place])   Multiple myeloma without remission ([place])  Multiple myeloma: Patient has started  chemotherapy (Velcade, steroid and Cytoxan: Monitor closely for medication side effects. Oncology is managing.  Severe acute kidney injury: Multifactorial.  Blood lambda/Kappa light chains are elevated.  Patient did have fever 11/24/2018 and chest x-ray was done which showed destructive lesion in the right third rib.  This is highly suggestive of  myeloma kidney..  I spoke with nephrology about it and will get hemato-oncology consultation.  Spoke with Dr. Learta Codding about this finding.  Oncology will be consulting for further input and management.   Creatinine has improved from 10.8 on presentation to 6.8 today. Receiving IV fluids with good diuresis. Strictly monitor intake and output charting.  Negative balance documented in the computer. CT scan of the abdomen did not show any evidence of hydronephrosis or obstruction, but nonobstructive calculus.  HIV nonreactive.  Urine shows mild proteinuria and dipstick was positive for blood.  Urine RBC 0-5.  CK level was 92.  11/25/2018: AKI is likely  Related to myeloma and associated medical condition including hypercalcemia and volume depletion.  Continue to treat primary process.  Hypercalcemia has resolved.   New fever T-max of 102.9 F.  No leukocytosis.  Denies localizing signs and symptoms of infection.  COVID-19 was negative.  Blood cultures and urine culture have been sent.  Chest x-ray was obtained without any infiltrate but destructive right third rib lesion. 11/25/2018: T-max is 101.1.  Fever is resolving.  Likely related to patient's cancer.  T12-L1 compression fracture: Abnormal bone density on the CT scan.  Patient does have recent  trauma as well. Possibility of myeloma bone disease and pathological fracture  Hyponatremia: Resolved.   Abnormal density of the spine on CT scan: Likely secondary to myeloma.  Hypercalcemia:  Likely secondary to myeloma Improved with IV fluid hydration, pamidronate and calcitonin.   11/25/2018: Calcium is 10  today.  Leukocytosis: 11/25/2018: Leukocytosis has resolved.  Back pain and neck spasms: Likely related to myeloma. 11/25/2018: None reported today.  Mild anemia:   Normocytic  Likely multifactorial Continue to monitor closely  VTE Prophylaxis: SCD,  Code Status: Full code Family Communication:   Disposition Plan: This will depend on hospital course.  Consultants:  Nephrology  Oncology.  Dr. Learta Codding  Procedures:  None  Antibiotics: Anti-infectives (From admission, onward)   None     Subjective: No fever or chills No shortness of breath No chest pain  Objective: Vitals:   11/25/18 1006 11/25/18 1246  BP: 134/83 (!) 139/100  Pulse: 66 (!) 59  Resp: 18 18  Temp: 97.7 F (36.5 C)   SpO2: 100% 100%    Intake/Output Summary (Last 24 hours) at 11/25/2018 1559 Last data filed at 11/25/2018 1400 Gross per 24 hour  Intake 910 ml  Output 2350 ml  Net -1440 ml   Filed Weights   11/23/18 0526 11/24/18 0534 11/25/18 0500  Weight: 91.3 kg 94.5 kg 95.3 kg   Body mass index is 28.48 kg/m.   Physical Exam: General:  Average built, not in obvious distress HENT: Normocephalic, pupils equally reacting to light and accommodation.  No scleral pallor or icterus noted. Oral mucosa is moist.  Chest:    Diminished breath sounds bilaterally. No crackles or wheezes.  CVS: S1 &S2 heard. No murmur.  Regular rate and rhythm. Abdomen: Soft, nontender, nondistended.  Bowel sounds are heard.  Liver is not palpable, no abdominal mass palpated Extremities: No cyanosis, clubbing..  Peripheral pulses are palpable. Psych: Alert, awake and oriented, normal mood CNS:  No cranial nerve deficits.  Power equal in all extremities.  No sensory deficits noted.  No cerebellar signs.   Skin: Warm and dry.  No rashes noted.   Data Review: I have personally reviewed the following laboratory data and studies,  CBC: Recent Labs  Lab 11/20/18 2325 11/22/18 0420 11/24/18 0408 11/25/18 0405  WBC  14.1* 8.6 5.7 5.2  NEUTROABS 10.9*  --   --   --   HGB 13.4 10.9* 10.2* 9.8*  HCT 38.8* 31.9* 30.2* 30.5*  MCV 81.3 82.6 83.2 85.7  PLT 457* 390 328 944   Basic Metabolic Panel: Recent Labs  Lab 11/21/18 0923  11/21/18 2155 11/22/18 0420 11/23/18 0404 11/24/18 0408 11/25/18 0405  NA 130*   < > 132* 132* 135 135 136  K 5.0   < > 4.8 4.7 4.7 4.8 5.0  CL 92*   < > 98 100 107 108 109  CO2 19*   < > 22 20* 18* 18* 15*  GLUCOSE 100*   < > 99 114* 91 97 198*  BUN 124*   < > 114* 110* 93* 66* 59*  CREATININE 10.32*   < > 10.05* 9.54* 8.37* 6.89* 6.06*  CALCIUM 13.7*   13.0*   < > 12.7* 12.1* 10.3 10.7* 10.0  MG  --   --   --  2.3  --   --  2.1  PHOS 8.0*  --   --  8.3* 6.1* 5.2* 4.8*   5.0*   < > = values in this interval not displayed.  Liver Function Tests: Recent Labs  Lab 11/20/18 2325 11/21/18 0923 11/23/18 0404 11/24/18 0408 11/25/18 0405  AST 16 17  --   --   --   ALT 32 30  --   --   --   ALKPHOS 84 82  --   --   --   BILITOT 0.8 0.8  --   --   --   PROT 8.4* 8.0  --   --   --   ALBUMIN 4.4 4.0 3.5 3.3* 3.2*   Recent Labs  Lab 11/20/18 2325  LIPASE 58*   No results for input(s): AMMONIA in the last 168 hours. Cardiac Enzymes: Recent Labs  Lab 11/22/18 0420  CKTOTAL 92   BNP (last 3 results) No results for input(s): BNP in the last 8760 hours.  ProBNP (last 3 results) No results for input(s): PROBNP in the last 8760 hours.  CBG: No results for input(s): GLUCAP in the last 168 hours. Recent Results (from the past 240 hour(s))  SARS CORONAVIRUS 2 (TAT 6-24 HRS) Nasopharyngeal Nasopharyngeal Swab     Status: None   Collection Time: 11/21/18  8:50 PM   Specimen: Nasopharyngeal Swab  Result Value Ref Range Status   SARS Coronavirus 2 NEGATIVE NEGATIVE Final    Comment: (NOTE) SARS-CoV-2 target nucleic acids are NOT DETECTED. The SARS-CoV-2 RNA is generally detectable in upper and lower respiratory specimens during the acute phase of infection.  Negative results do not preclude SARS-CoV-2 infection, do not rule out co-infections with other pathogens, and should not be used as the sole basis for treatment or other patient management decisions. Negative results must be combined with clinical observations, patient history, and epidemiological information. The expected result is Negative. Fact Sheet for Patients: SugarRoll.be Fact Sheet for Healthcare Providers: https://www.woods-mathews.com/ This test is not yet approved or cleared by the [place] FDA and  has been authorized for detection and/or diagnosis of SARS-CoV-2 by FDA under an Emergency Use Authorization (EUA). This EUA will remain  in effect (meaning this test can be used) for the duration of the COVID-19 declaration under Section 56 4(b)(1) of the Act, 21 U.S.C. section 360bbb-3(b)(1), unless the authorization is terminated or revoked sooner. Performed at [facility], [place] [address] [place], [place] [address]   Culture, Urine     Status: Abnormal   Collection Time: 11/24/18  7:22 AM   Specimen: Urine, Clean Catch  Result Value Ref Range Status   Specimen Description   Final    URINE, CLEAN CATCH Performed at [facility], [place] [address] [place], [place] [address]    Special Requests   Final    NONE Performed at [facility], [place] [address], [place], [place] [address]    Culture (A)  Final    60,000 COLONIES/mL MULTIPLE SPECIES PRESENT, SUGGEST RECOLLECTION   Report Status 11/25/2018 FINAL  Final  Culture, blood (routine x 2)     Status: None (Preliminary result)   Collection Time: 11/24/18  7:57 AM   Specimen: BLOOD LEFT HAND  Result Value Ref Range Status   Specimen Description   Final    BLOOD LEFT HAND Performed at [place] [address] [place], [place] [address]    Special Requests   Final    BOTTLES DRAWN AEROBIC AND ANAEROBIC Blood  Culture adequate volume Performed at [facility] [place] [address] [place], [place] [address]    Culture   Final  NO GROWTH 1 DAY Performed at [facility], [place] [address] [place], [place] [address]    Report Status PENDING  Incomplete  Culture, blood (routine x 2)     Status: None (Preliminary result)   Collection Time: 11/24/18  7:58 AM   Specimen: BLOOD RIGHT HAND  Result Value Ref Range Status   Specimen Description   Final    BLOOD RIGHT HAND Performed at [place] [address] [place], [place] [address]    Special Requests   Final    BOTTLES DRAWN AEROBIC AND ANAEROBIC Blood Culture adequate volume Performed at [facility] [address], [place], [place] [address]    Culture   Final    NO GROWTH 1 DAY Performed at [facility], [place] [address] [place], [place] [address]    Report Status PENDING  Incomplete     Studies: Ct Chest Wo Contrast  Result Date: 11/24/2018 CLINICAL DATA:  History of multiple myeloma. Destructive mass in the right third rib. Bilateral lower extremity weakness and falls. EXAM: CT CHEST WITHOUT CONTRAST TECHNIQUE: Multidetector CT imaging of the chest was performed following the standard protocol without IV contrast. COMPARISON:  Single-view of the chest 11/24/2018. Plain films of the ribs 11/17/2018. FINDINGS: Cardiovascular: Heart size is upper normal. No pericardial effusion. No aortic aneurysm. Mediastinum/Nodes: No enlarged mediastinal or axillary lymph nodes. Thyroid gland, trachea, and esophagus demonstrate no significant findings. Lungs/Pleura: No pleural effusion. Mild dependent atelectasis. Lungs otherwise clear. Upper Abdomen: Negative. Musculoskeletal: Lytic lesions are seen in all imaged bones. A large destructive lesion with an associated soft tissue mass in the right third rib is identified. The mass measures approximately 8 cm AP by 4 cm transverse by 4 cm  craniocaudal. Also seen is a mild superior endplate compression fracture of T12 with vertebral body height loss of approximately 10%. More severe compression fracture of T5 is identified with vertebral body height loss of up to 80-90%. There is mild bony retropulsion off the superior end endplate of T5 but the central canal and foramina appear open. IMPRESSION: Lytic lesions in all imaged bones consistent with the patient's history of multiple myeloma. Expansile, destructive mass lesion in the right third rib is also likely due to myeloma. Pathologic compression fractures of T12 and T5. T5 fracture is worse where there is vertebral body height loss of 80-90% and mild bony retropulsion off the superior endplate. No resultant central canal stenosis. No acute cardiopulmonary disease. Electronically Signed   By: Inge Rise M.D.   On: 11/24/2018 20:15   Dg Chest Port 1 View  Result Date: 11/24/2018 CLINICAL DATA:  Fever. EXAM: PORTABLE CHEST 1 VIEW COMPARISON:  Rib radiographs 11/17/2018 FINDINGS: The cardiac silhouette is borderline enlarged. The lungs are mildly hypoinflated with similar appearance of mild interstitial coarsening. No confluent airspace opacity, overt pulmonary edema, sizable pleural effusion, or pneumothorax is identified. A soft tissue mass and associated destruction of the right lateral third rib are again seen. IMPRESSION: 1. Mild hypoinflation.  No consolidation. 2. Destructive mass involving the right lateral third rib. Electronically Signed   By: Logan Bores M.D.   On: 11/24/2018 10:04    Scheduled Meds:  dexamethasone  40 mg Oral Daily   lidocaine  1 patch Transdermal Q24H   sodium bicarbonate  650 mg Oral BID    Continuous Infusions:   sodium bicarbonate (isotonic) infusion in sterile water 100 mL/hr at 11/25/18 0830     Yehuda Savannah I  Marthenia Rolling, MD  [facility] 11/25/2018

## 2018-11-25 NOTE — Progress Notes (Signed)
Subjective: The patient is seen and examined today.  He is feeling a little bit better today but continues to have low back pain as well as increased urination.  He denied having any fever or chills.  He has no nausea, vomiting, diarrhea or constipation.  He feels hungry.  He started treatment with Velcade and Decadron yesterday and he is expected to start the first dose of Cytoxan today.  He has no adverse effect of his treatment so far.  Objective: Vital signs in last 24 hours: Temp:  [97.9 F (36.6 C)-99.2 F (37.3 C)] 97.9 F (36.6 C) (09/05 0409) Pulse Rate:  [78-130] 78 (09/05 0409) Resp:  [16-18] 16 (09/05 0409) BP: (122-154)/(78-91) 124/83 (09/05 0409) SpO2:  [96 %-100 %] 100 % (09/05 0409) Weight:  [210 lb (95.3 kg)] 210 lb (95.3 kg) (09/05 0500)  Intake/Output from previous day: 09/04 0701 - 09/05 0700 In: 7215 [P.O.:360; I.V.:6855] Out: 3100 [Urine:3100] Intake/Output this shift: No intake/output data recorded.  General appearance: alert, cooperative, fatigued and no distress Resp: clear to auscultation bilaterally Cardio: regular rate and rhythm, S1, S2 normal, no murmur, click, rub or gallop GI: soft, non-tender; bowel sounds normal; no masses,  no organomegaly Extremities: extremities normal, atraumatic, no cyanosis or edema  Lab Results:  Recent Labs    11/24/18 0408 11/25/18 0405  WBC 5.7 5.2  HGB 10.2* 9.8*  HCT 30.2* 30.5*  PLT 328 278   BMET Recent Labs    11/24/18 0408 11/25/18 0405  NA 135 136  K 4.8 5.0  CL 108 109  CO2 18* 15*  GLUCOSE 97 198*  BUN 66* 59*  CREATININE 6.89* 6.06*  CALCIUM 10.7* 10.0    Studies/Results: Ct Chest Wo Contrast  Result Date: 11/24/2018 CLINICAL DATA:  History of multiple myeloma. Destructive mass in the right third rib. Bilateral lower extremity weakness and falls. EXAM: CT CHEST WITHOUT CONTRAST TECHNIQUE: Multidetector CT imaging of the chest was performed following the standard protocol without IV contrast.  COMPARISON:  Single-view of the chest 11/24/2018. Plain films of the ribs 11/17/2018. FINDINGS: Cardiovascular: Heart size is upper normal. No pericardial effusion. No aortic aneurysm. Mediastinum/Nodes: No enlarged mediastinal or axillary lymph nodes. Thyroid gland, trachea, and esophagus demonstrate no significant findings. Lungs/Pleura: No pleural effusion. Mild dependent atelectasis. Lungs otherwise clear. Upper Abdomen: Negative. Musculoskeletal: Lytic lesions are seen in all imaged bones. A large destructive lesion with an associated soft tissue mass in the right third rib is identified. The mass measures approximately 8 cm AP by 4 cm transverse by 4 cm craniocaudal. Also seen is a mild superior endplate compression fracture of T12 with vertebral body height loss of approximately 10%. More severe compression fracture of T5 is identified with vertebral body height loss of up to 80-90%. There is mild bony retropulsion off the superior end endplate of T5 but the central canal and foramina appear open. IMPRESSION: Lytic lesions in all imaged bones consistent with the patient's history of multiple myeloma. Expansile, destructive mass lesion in the right third rib is also likely due to myeloma. Pathologic compression fractures of T12 and T5. T5 fracture is worse where there is vertebral body height loss of 80-90% and mild bony retropulsion off the superior endplate. No resultant central canal stenosis. No acute cardiopulmonary disease. Electronically Signed   By: Inge Rise M.D.   On: 11/24/2018 20:15   Dg Chest Port 1 View  Result Date: 11/24/2018 CLINICAL DATA:  Fever. EXAM: PORTABLE CHEST 1 VIEW COMPARISON:  Rib radiographs  11/17/2018 FINDINGS: The cardiac silhouette is borderline enlarged. The lungs are mildly hypoinflated with similar appearance of mild interstitial coarsening. No confluent airspace opacity, overt pulmonary edema, sizable pleural effusion, or pneumothorax is identified. A soft tissue  mass and associated destruction of the right lateral third rib are again seen. IMPRESSION: 1. Mild hypoinflation.  No consolidation. 2. Destructive mass involving the right lateral third rib. Electronically Signed   By: Logan Bores M.D.   On: 11/24/2018 10:04    Medications: I have reviewed the patient's current medications.  Assessment/Plan: This is a very pleasant 43 years old African-American male recently diagnosed with multiple myeloma pending bone marrow biopsy and aspirate but the patient has many findings consistent with the disease. He started treatment with Velcade, Decadron and first dose of Cytoxan today.  He has been tolerating the treatment well so far. For the renal insufficiency, this is likely secondary to his disease and we hope for improvement of his renal function with the treatment and hydration.  He is followed by nephrology. For pain management he will continue his current pain medication with Dilaudid as needed. Hypercalcemia, significantly improved and currently within the normal range. Thank you for taking good care of [name], we will continue to follow up the patient with you and assist in his management.   LOS: 4 days    [name] 11/25/2018

## 2018-11-25 NOTE — Progress Notes (Signed)
Pt refused HS CBG. Pt educated on importance of checking CBG since he is taking decadron. Pt still refused. Will monitor pt closely.

## 2018-11-25 NOTE — Progress Notes (Signed)
   11/24/18 2046  MEWS Score  Pulse Rate (!) 129  BP (!) 144/83  Temp 98.8 F (37.1 C)  SpO2 96 %  MEWS Score  MEWS RR 0  MEWS Pulse 2  MEWS Systolic 0  MEWS LOC 0  MEWS Temp 0  MEWS Score 2  MEWS Score Color Yellow  MEWS Assessment  Is this an acute change? Yes  MEWS guidelines implemented *See [place]  Provider Notification  Provider Name/Title Bodenheimer, NP  Date Provider Notified 11/24/18  Notification Type Page  Response See new orders   Mews now yellow d/t patient's increased HR. Started around the time chemo nurse was administering injection. Patient says it probably has to do with his pain but he wishes to take nothing other than tylenol at the time being. Notified provider and administered 5mg  lopressor per order. Increased frequency of monitoring vitals. Will continue with plan of care.

## 2018-11-26 ENCOUNTER — Inpatient Hospital Stay (HOSPITAL_COMMUNITY): Payer: BC Managed Care – PPO

## 2018-11-26 LAB — RENAL FUNCTION PANEL
Albumin: 3.1 g/dL — ABNORMAL LOW (ref 3.5–5.0)
Anion gap: 7 (ref 5–15)
BUN: 56 mg/dL — ABNORMAL HIGH (ref 6–20)
CO2: 23 mmol/L (ref 22–32)
Calcium: 9.2 mg/dL (ref 8.9–10.3)
Chloride: 105 mmol/L (ref 98–111)
Creatinine, Ser: 4.66 mg/dL — ABNORMAL HIGH (ref 0.61–1.24)
GFR calc Af Amer: 16 mL/min — ABNORMAL LOW (ref >60)
GFR calc non Af Amer: 14 mL/min — ABNORMAL LOW (ref >60)
Glucose, Bld: 144 mg/dL — ABNORMAL HIGH (ref 70–99)
Phosphorus: 3.6 mg/dL (ref 2.5–4.6)
Potassium: 4.1 mmol/L (ref 3.5–5.1)
Sodium: 135 mmol/L (ref 135–145)

## 2018-11-26 LAB — CBC WITH DIFFERENTIAL/PLATELET
Abs Immature Granulocytes: 0.07 10*3/uL (ref 0.00–0.07)
Basophils Absolute: 0 10*3/uL (ref 0.0–0.1)
Basophils Relative: 0 %
Eosinophils Absolute: 0 10*3/uL (ref 0.0–0.5)
Eosinophils Relative: 0 %
HCT: 26.9 % — ABNORMAL LOW (ref 39.0–52.0)
Hemoglobin: 8.8 g/dL — ABNORMAL LOW (ref 13.0–17.0)
Immature Granulocytes: 1 %
Lymphocytes Relative: 11 %
Lymphs Abs: 1 10*3/uL (ref 0.7–4.0)
MCH: 27.5 pg (ref 26.0–34.0)
MCHC: 32.7 g/dL (ref 30.0–36.0)
MCV: 84.1 fL (ref 80.0–100.0)
Monocytes Absolute: 0.4 10*3/uL (ref 0.1–1.0)
Monocytes Relative: 4 %
Neutro Abs: 7.3 10*3/uL (ref 1.7–7.7)
Neutrophils Relative %: 84 %
Platelets: 276 10*3/uL (ref 150–400)
RBC: 3.2 MIL/uL — ABNORMAL LOW (ref 4.22–5.81)
RDW: 13.2 % (ref 11.5–15.5)
WBC: 8.7 10*3/uL (ref 4.0–10.5)
nRBC: 0 % (ref 0.0–0.2)

## 2018-11-26 LAB — GLUCOSE, CAPILLARY
Glucose-Capillary: 130 mg/dL — ABNORMAL HIGH (ref 70–99)
Glucose-Capillary: 155 mg/dL — ABNORMAL HIGH (ref 70–99)
Glucose-Capillary: 109 mg/dL — ABNORMAL HIGH (ref 70–99)
Glucose-Capillary: 158 mg/dL — ABNORMAL HIGH (ref 70–99)

## 2018-11-26 LAB — MAGNESIUM: Magnesium: 2 mg/dL (ref 1.7–2.4)

## 2018-11-26 MED ORDER — ALPRAZOLAM 0.25 MG PO TABS
0.2500 mg | ORAL_TABLET | Freq: Two times a day (BID) | ORAL | Status: DC | PRN
  Administered 2018-11-26 – 2018-12-03 (×5): 0.25 mg via ORAL
  Filled 2018-11-26 (×5): qty 1

## 2018-11-26 NOTE — Progress Notes (Signed)
[facility] Progress Note  Subjective: feeling ok, creat down 10  > 8.0 > 6.8 > 6.1 > 4.6 today  Vitals:   11/25/18 1246 11/25/18 2143 11/26/18 0449 11/26/18 0455  BP: (!) 139/100 136/75 125/88   Pulse: (!) 59 89 85   Resp: _0 Temp:  97.8 F (36.6 C) 97.7 F (36.5 C)   TempSrc:  Oral Oral   SpO2: 100% 98% 98%   Weight:    95.8 kg  Height:        Inpatient medications: . dexamethasone  40 mg Oral Daily  . lidocaine  1 patch Transdermal Q24H  . sodium bicarbonate  650 mg Oral BID   .  sodium bicarbonate (isotonic) infusion in sterile water 100 mL/hr at 11/26/18 0033   acetaminophen, ALPRAZolam, baclofen, hydrALAZINE, HYDROmorphone (DILAUDID) injection, ondansetron, oxyCODONE, sodium chloride flush    Exam: Gen no distress, calm No rash, cyanosis or gangrene Sclera anicteric, throat clear  No jvd or bruits Chest clear bilat to bases RRR no MRG Abd soft ntnd no mass or ascites +bs GU normal male MS no joint effusions or deformity Ext no LE or UE edema, no wounds or ulcers Neuro is alert, Ox 3 , nf    Home meds:  - baclofen 5-10 mg prn/ ibuprofen 200 qid prn/ nabumetone 750 bid prn/ hydrocodone-acetaminophen qid prn/ ondansetron 48m tid prn - magnesium salicylate prn po - methylprednisolone dosepak 455mx 6 days ut dict   Renal [place] 13.4 and 11.6 cm kidneys , no hydro, ^'d echo bilat    Assessment/ Plan: 1. 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 Creat declining down to 4.6 today (10.8 on admit). Cont IVF's.  ChemoRx initiated per ONC w/ Velcade/ dexamethasone/ po Cytoxan. Will follow.  2. Hypercalcemia - better/ resolved  3. Mult myeloma - per ONC, as above 4. Volume - euvolemic on exam, good UOP.  5. Met acidosis - have changed IVFs to bicarb gtt and added bicarb tabs     [facility] 11/26/2018, 10:02 AM  Iron/TIBC/Ferritin/ %Sat No results found for: IRON, TIBC, FERRITIN, IRONPCTSAT Recent Labs   Lab 11/25/18 0405  NA 136  K 5.0  CL 109  CO2 15*  GLUCOSE 198*  BUN 59*  CREATININE 6.06*  CALCIUM 10.0  PHOS 4.8*  5.0*  ALBUMIN 3.2*   Recent Labs  Lab 11/21/18 0923  AST 17  ALT 30  ALKPHOS 82  BILITOT 0.8  PROT 8.0   Recent Labs  Lab 11/26/18 0845  WBC 8.7  HGB 8.8*  HCT 26.9*  PLT 276

## 2018-11-26 NOTE — Progress Notes (Signed)
PROGRESS NOTE  Derek [name] NTI:[id number] DOB: 1975/07/12 DOA: 11/20/2018 PCP: Posey Boyer, MD   LOS: 5 days   Brief narrative:  Derek [name] is a 43 y.o. male with  past medical history significant for back pain for which patient has been seeing orthopedic team.  Patient presented with nausea and anorexia.  On presentation to the hospital, patient was found to have serum creatinine of 10.85, BUN of 123, WBC count 14.1, total protein of 8.4, calcium of 13.6, albumin of 4.4 and UA showed 100 protein.  Work-up done was suggestive of multiple myeloma, with CT scan of the chest with contrast revealing lesions in all imaged bones, expansile and destructive mass lesion in the right third rib, pathologic compression fractures of T12 and T5.  Nephrology team and oncology team assisting with patient's management.  Patient is currently on IV steroids, Velcade and Cytoxan.  Renal function has improved significantly.  Serum creatinine is down to 4.66.  Patient has just undergone skeletal survey, and the result is pending.  Oncology team is directing care.  Assessment/Plan:  Principal Problem:   ARF (acute renal failure) (HCC) Active Problems:   Low back pain   Hypercalcemia   Acute renal failure (ARF) ([place])   AKI (acute kidney injury) ([place])   Multiple myeloma without remission ([place])  Multiple myeloma: Patient has started chemotherapy (Velcade, steroid and Cytoxan): Monitor closely for medication side effects. Oncology is managing.  Severe acute kidney injury: Possibly multifactorial.   Likely, patient has multiple myeloma.   Patient also had elevated calcium on presentation, with likely associated volume depletion.  Nephrology team is directing care.   Patient is already on multiple myeloma treatment.   Serum creatinine today is 4.66.  T12-L1 compression fracture: Abnormal bone density on the CT scan.  Patient does have recent trauma as well. Possibility of myeloma bone disease  and pathological fracture  Hyponatremia: Resolved.   Abnormal density of the spine on CT scan: Likely secondary to myeloma.  Hypercalcemia:  Likely secondary to myeloma Improved with IV fluid hydration, pamidronate and calcitonin.   11/26/2018: Calcium is 9.2 today.  Leukocytosis: Leukocytosis has resolved.  Mild anemia:   Normocytic   VTE Prophylaxis: SCD,  Code Status: Full code Family Communication:   Disposition Plan: This will depend on hospital course.  Consultants:  Nephrology Oncology.    Procedures:  None  Antibiotics: Anti-infectives (From admission, onward)   None     Subjective: No fever or chills No shortness of breath No chest pain  Objective: Vitals:   11/25/18 2143 11/26/18 0449  BP: 136/75 125/88  Pulse: 89 85  Resp: 18 16  Temp: 97.8 F (36.6 C) 97.7 F (36.5 C)  SpO2: 98% 98%    Intake/Output Summary (Last 24 hours) at 11/26/2018 1434 Last data filed at 11/26/2018 1000 Gross per 24 hour  Intake 2980 ml  Output 2875 ml  Net 105 ml   Filed Weights   11/24/18 0534 11/25/18 0500 11/26/18 0455  Weight: 94.5 kg 95.3 kg 95.8 kg   Body mass index is 28.64 kg/m.   Physical Exam: General:  Average built, not in obvious distress HENT: Normocephalic, pupils equally reacting to light and accommodation.  No scleral pallor or icterus noted. Oral mucosa is moist.  Chest:    Diminished breath sounds bilaterally. No crackles or wheezes.  CVS: S1 &S2 heard. No murmur.  Regular rate and rhythm. Abdomen: Soft, nontender, nondistended.  Bowel sounds are heard.  Liver is not palpable, no abdominal  mass palpated Extremities: No cyanosis, clubbing..  Peripheral pulses are palpable. Psych: Alert, awake and oriented, normal mood CNS:  No cranial nerve deficits.  Power equal in all extremities.  No sensory deficits noted.  No cerebellar signs.   Skin: Warm and dry.  No rashes noted.   Data Review: I have personally reviewed the following laboratory  data and studies,  CBC: Recent Labs  Lab 11/20/18 2325 11/22/18 0420 11/24/18 0408 11/25/18 0405 11/26/18 0845  WBC 14.1* 8.6 5.7 5.2 8.7  NEUTROABS 10.9*  --   --   --  7.3  HGB 13.4 10.9* 10.2* 9.8* 8.8*  HCT 38.8* 31.9* 30.2* 30.5* 26.9*  MCV 81.3 82.6 83.2 85.7 84.1  PLT 457* 390 328 278 094   Basic Metabolic Panel: Recent Labs  Lab 11/22/18 0420 11/23/18 0404 11/24/18 0408 11/25/18 0405 11/26/18 0844 11/26/18 0845  NA 132* 135 135 136 135  --   K 4.7 4.7 4.8 5.0 4.1  --   CL 100 107 108 109 105  --   CO2 20* 18* 18* 15* 23  --   GLUCOSE 114* 91 97 198* 144*  --   BUN 110* 93* 66* 59* 56*  --   CREATININE 9.54* 8.37* 6.89* 6.06* 4.66*  --   CALCIUM 12.1* 10.3 10.7* 10.0 9.2  --   MG 2.3  --   --  2.1  --  2.0  PHOS 8.3* 6.1* 5.2* 4.8*   5.0* 3.6  --    Liver Function Tests: Recent Labs  Lab 11/20/18 2325 11/21/18 0923 11/23/18 0404 11/24/18 0408 11/25/18 0405 11/26/18 0844  AST 16 17  --   --   --   --   ALT 32 30  --   --   --   --   ALKPHOS 84 82  --   --   --   --   BILITOT 0.8 0.8  --   --   --   --   PROT 8.4* 8.0  --   --   --   --   ALBUMIN 4.4 4.0 3.5 3.3* 3.2* 3.1*   Recent Labs  Lab 11/20/18 2325  LIPASE 58*   No results for input(s): AMMONIA in the last 168 hours. Cardiac Enzymes: Recent Labs  Lab 11/22/18 0420  CKTOTAL 92   BNP (last 3 results) No results for input(s): BNP in the last 8760 hours.  ProBNP (last 3 results) No results for input(s): PROBNP in the last 8760 hours.  CBG: Recent Labs  Lab 11/26/18 0834 11/26/18 1151  GLUCAP 130* 155*   Recent Results (from the past 240 hour(s))  SARS CORONAVIRUS 2 (TAT 6-24 HRS) Nasopharyngeal Nasopharyngeal Swab     Status: None   Collection Time: 11/21/18  8:50 PM   Specimen: Nasopharyngeal Swab  Result Value Ref Range Status   SARS Coronavirus 2 NEGATIVE NEGATIVE Final    Comment: (NOTE) SARS-CoV-2 target nucleic acids are NOT DETECTED. The SARS-CoV-2 RNA is generally  detectable in upper and lower respiratory specimens during the acute phase of infection. Negative results do not preclude SARS-CoV-2 infection, do not rule out co-infections with other pathogens, and should not be used as the sole basis for treatment or other patient management decisions. Negative results must be combined with clinical observations, patient history, and epidemiological information. The expected result is Negative. Fact Sheet for Patients: SugarRoll.be Fact Sheet for Healthcare Providers: https://www.woods-mathews.com/ This test is not yet approved or cleared by the [place] FDA  and  has been authorized for detection and/or diagnosis of SARS-CoV-2 by FDA under an Emergency Use Authorization (EUA). This EUA will remain  in effect (meaning this test can be used) for the duration of the COVID-19 declaration under Section 56 4(b)(1) of the Act, 21 U.S.C. section 360bbb-3(b)(1), unless the authorization is terminated or revoked sooner. Performed at [facility], [place] [address], [place], [place] [address]   Culture, Urine     Status: Abnormal   Collection Time: 11/24/18  7:22 AM   Specimen: Urine, Clean Catch  Result Value Ref Range Status   Specimen Description   Final    URINE, CLEAN CATCH Performed at [facility], [place] [address], [place], [place] [address]    Special Requests   Final    NONE Performed at [facility], [place] [address], [place], [place] [address]    Culture (A)  Final    60,000 COLONIES/mL MULTIPLE SPECIES PRESENT, SUGGEST RECOLLECTION   Report Status 11/25/2018 FINAL  Final  Culture, blood (routine x 2)     Status: None (Preliminary result)   Collection Time: 11/24/18  7:57 AM   Specimen: BLOOD LEFT HAND  Result Value Ref Range Status   Specimen Description   Final    BLOOD LEFT HAND Performed at [facility] [address]  [place], [place] [address]    Special Requests   Final    BOTTLES DRAWN AEROBIC AND ANAEROBIC Blood Culture adequate volume Performed at [place] [facility] [address], [place], [place] [address]    Culture   Final    NO GROWTH 2 DAYS Performed at [place] [address] [place], [place] [address]    Report Status PENDING  Incomplete  Culture, blood (routine x 2)     Status: None (Preliminary result)   Collection Time: 11/24/18  7:58 AM   Specimen: BLOOD RIGHT HAND  Result Value Ref Range Status   Specimen Description   Final    BLOOD RIGHT HAND Performed at [facility] [address] [place], [place] [address]    Special Requests   Final    BOTTLES DRAWN AEROBIC AND ANAEROBIC Blood Culture adequate volume Performed at [facility] [address], [place], [place] [address]    Culture   Final    NO GROWTH 2 DAYS Performed at [facility] [address], [place], [place] [address]    Report Status PENDING  Incomplete     Studies: Ct Chest Wo Contrast  Result Date: 11/24/2018 CLINICAL DATA:  History of multiple myeloma. Destructive mass in the right third rib. Bilateral lower extremity weakness and falls. EXAM: CT CHEST WITHOUT CONTRAST TECHNIQUE: Multidetector CT imaging of the chest was performed following the standard protocol without IV contrast. COMPARISON:  Single-view of the chest 11/24/2018. Plain films of the ribs 11/17/2018. FINDINGS: Cardiovascular: Heart size is upper normal. No pericardial effusion. No aortic aneurysm. Mediastinum/Nodes: No enlarged mediastinal or axillary lymph nodes. Thyroid gland, trachea, and esophagus demonstrate no significant findings. Lungs/Pleura: No pleural effusion. Mild dependent atelectasis. Lungs otherwise clear. Upper Abdomen: Negative. Musculoskeletal: Lytic lesions are seen in all imaged bones. A large destructive lesion with an associated soft tissue mass in the right  third rib is identified. The mass measures approximately 8 cm AP by 4 cm transverse by 4 cm craniocaudal. Also seen is a mild superior endplate compression fracture of T12 with vertebral body  height loss of approximately 10%. More severe compression fracture of T5 is identified with vertebral body height loss of up to 80-90%. There is mild bony retropulsion off the superior end endplate of T5 but the central canal and foramina appear open. IMPRESSION: Lytic lesions in all imaged bones consistent with the patient's history of multiple myeloma. Expansile, destructive mass lesion in the right third rib is also likely due to myeloma. Pathologic compression fractures of T12 and T5. T5 fracture is worse where there is vertebral body height loss of 80-90% and mild bony retropulsion off the superior endplate. No resultant central canal stenosis. No acute cardiopulmonary disease. Electronically Signed   By: Inge Rise M.D.   On: 11/24/2018 20:15    Scheduled Meds:  dexamethasone  40 mg Oral Daily   lidocaine  1 patch Transdermal Q24H   sodium bicarbonate  650 mg Oral BID    Continuous Infusions:   sodium bicarbonate (isotonic) infusion in sterile water 100 mL/hr at 11/26/18 0033     Bonnell Public, MD  [facility] 11/26/2018

## 2018-11-26 NOTE — Progress Notes (Signed)
Subjective: Derek [name] is seen and examined today.  He is very emotional and tearful at the time because of the recent diagnosis and bad news.  He was able to walk to the bathroom yesterday.  He denied having any current nausea, vomiting, diarrhea or constipation.  He denied having any fever or chills.  He is tolerating his current systemic chemotherapy fairly well.  Objective: Vital signs in last 24 hours: Temp:  [97.7 F (36.5 C)-97.8 F (36.6 C)] 97.7 F (36.5 C) (09/06 0449) Pulse Rate:  [59-89] 85 (09/06 0449) Resp:  [16-18] 16 (09/06 0449) BP: (125-139)/(75-100) 125/88 (09/06 0449) SpO2:  [98 %-100 %] 98 % (09/06 0449) Weight:  [211 lb 3.2 oz (95.8 kg)] 211 lb 3.2 oz (95.8 kg) (09/06 0455)  Intake/Output from previous day: 09/05 0701 - 09/06 0700 In: 3240 [P.O.:1440; I.V.:1800] Out: 2500 [Urine:2500] Intake/Output this shift: No intake/output data recorded.  General appearance: alert, cooperative, fatigued and no distress Resp: clear to auscultation bilaterally Cardio: regular rate and rhythm, S1, S2 normal, no murmur, click, rub or gallop and normal apical impulse GI: soft, non-tender; bowel sounds normal; no masses,  no organomegaly Extremities: extremities normal, atraumatic, no cyanosis or edema  Lab Results:  Recent Labs    11/24/18 0408 11/25/18 0405  WBC 5.7 5.2  HGB 10.2* 9.8*  HCT 30.2* 30.5*  PLT 328 278   BMET Recent Labs    11/24/18 0408 11/25/18 0405  NA 135 136  K 4.8 5.0  CL 108 109  CO2 18* 15*  GLUCOSE 97 198*  BUN 66* 59*  CREATININE 6.89* 6.06*  CALCIUM 10.7* 10.0    Studies/Results: Ct Chest Wo Contrast  Result Date: 11/24/2018 CLINICAL DATA:  History of multiple myeloma. Destructive mass in the right third rib. Bilateral lower extremity weakness and falls. EXAM: CT CHEST WITHOUT CONTRAST TECHNIQUE: Multidetector CT imaging of the chest was performed following the standard protocol without IV contrast. COMPARISON:  Single-view of the  chest 11/24/2018. Plain films of the ribs 11/17/2018. FINDINGS: Cardiovascular: Heart size is upper normal. No pericardial effusion. No aortic aneurysm. Mediastinum/Nodes: No enlarged mediastinal or axillary lymph nodes. Thyroid gland, trachea, and esophagus demonstrate no significant findings. Lungs/Pleura: No pleural effusion. Mild dependent atelectasis. Lungs otherwise clear. Upper Abdomen: Negative. Musculoskeletal: Lytic lesions are seen in all imaged bones. A large destructive lesion with an associated soft tissue mass in the right third rib is identified. The mass measures approximately 8 cm AP by 4 cm transverse by 4 cm craniocaudal. Also seen is a mild superior endplate compression fracture of T12 with vertebral body height loss of approximately 10%. More severe compression fracture of T5 is identified with vertebral body height loss of up to 80-90%. There is mild bony retropulsion off the superior end endplate of T5 but the central canal and foramina appear open. IMPRESSION: Lytic lesions in all imaged bones consistent with the patient's history of multiple myeloma. Expansile, destructive mass lesion in the right third rib is also likely due to myeloma. Pathologic compression fractures of T12 and T5. T5 fracture is worse where there is vertebral body height loss of 80-90% and mild bony retropulsion off the superior endplate. No resultant central canal stenosis. No acute cardiopulmonary disease. Electronically Signed   By: Inge Rise M.D.   On: 11/24/2018 20:15    Medications: I have reviewed the patient's current medications.  Assessment/Plan: This is a very pleasant 43 years old African-American male recently diagnosed with multiple myeloma pending bone marrow biopsy and  aspirate but the patient has many findings consistent with the disease. I will order a skeletal bone survey today to identify other areas of bone disease and avoid any risk of fracture. He will continue his current treatment  with Velcade, Decadron and Cytoxan. For the renal insufficiency, this is likely secondary to his disease.  His lab work is still pending for today.   For pain management he will continue his current pain medication with Dilaudid as needed. For the anxiety, I will start the patient on Xanax 0.25 mg p.o. twice daily as needed. Hypercalcemia, significantly improved and currently within the normal range. He will need close monitoring of his blood sugar because of the recent treatment with Decadron. Dr. Benay Spice will resume his care tomorrow. Thank you for taking good care of [name], we will continue to follow up the patient with you and assist in his management.   LOS: 5 days    Derek [name] 11/26/2018

## 2018-11-26 NOTE — Progress Notes (Signed)
Patient ID: Derek [name], male   DOB: 30-Mar-1975, 43 y.o.   MRN: [id number]   Came to discuss Bone marrow biopsy Pt is under impression that procedure has been cancelled per his discussions with Dr Jonnie Finner and Dr Julien Nordmann  Notes seem to support Bx is still wanted  Order has not been cancelled in Epic  Discussed at length the procedure and benefits/risks  Will clarify if Bx is still wanted .  If so, we will plan for 9/8 in IR  Will come back to gain consent if moving forward.

## 2018-11-27 LAB — BASIC METABOLIC PANEL
Anion gap: 13 (ref 5–15)
BUN: 59 mg/dL — ABNORMAL HIGH (ref 6–20)
CO2: 27 mmol/L (ref 22–32)
Calcium: 9.3 mg/dL (ref 8.9–10.3)
Chloride: 98 mmol/L (ref 98–111)
Creatinine, Ser: 4.14 mg/dL — ABNORMAL HIGH (ref 0.61–1.24)
GFR calc Af Amer: 19 mL/min — ABNORMAL LOW (ref >60)
GFR calc non Af Amer: 16 mL/min — ABNORMAL LOW (ref >60)
Glucose, Bld: 134 mg/dL — ABNORMAL HIGH (ref 70–99)
Potassium: 3.9 mmol/L (ref 3.5–5.1)
Sodium: 138 mmol/L (ref 135–145)

## 2018-11-27 LAB — GLUCOSE, CAPILLARY
Glucose-Capillary: 98 mg/dL (ref 70–99)
Glucose-Capillary: 117 mg/dL — ABNORMAL HIGH (ref 70–99)
Glucose-Capillary: 96 mg/dL (ref 70–99)
Glucose-Capillary: 160 mg/dL — ABNORMAL HIGH (ref 70–99)

## 2018-11-27 NOTE — Progress Notes (Signed)
Patient ID: Derek [name], male   DOB: [birth year], 43 y.o.   MRN: [id number]  [facility] Progress Note   Assessment/ Plan:   1. Acute kidney Injury: Likely secondary to multiple myeloma versus hemodynamically mediated renal injury in the setting of hypercalcemia and ongoing NSAIDs.  Creatinine continues to show downward trend with improving urine output with ongoing intravenous fluid support. 2.  Multiple myeloma: Started on chemotherapy with Decadron, cyclophosphamide and Velcade.  Hematology directing additional management.  Bone marrow biopsy scheduled for tomorrow. 3.  Hypercalcemia: Secondary to multiple myeloma, corrected with pamidronate and isotonic fluids. 4.  Anion gap metabolic acidosis: Secondary to acute kidney injury, will discontinue sodium bicarbonate tablets as he is getting isotonic sodium bicarbonate with improvement of metabolic acidosis.  Subjective:   Reports to be feeling fair, wants to try and ambulate with assistance   Objective:   BP (!) 148/82 (BP Location: Right Arm)   Pulse 78   Temp 97.8 F (36.6 C) (Oral)   Resp 18   Ht 6' (1.829 m)   Wt 96 kg   SpO2 100%   BMI 28.70 kg/m   Intake/Output Summary (Last 24 hours) at 11/27/2018 1337 Last data filed at 11/27/2018 1259 Gross per 24 hour  Intake 1783.84 ml  Output 3700 ml  Net -1916.16 ml   Weight change: 0.2 kg  Physical Exam: Gen: Comfortably resting in bed CVS: Pulse regular rhythm, normal rate, S1 and S2 normal Resp: Clear to auscultation, no rales/rhonchi Abd: Soft, obese, nontender Ext: No lower extremity edema  Imaging: Dg Bone Survey Met  Result Date: 11/26/2018 CLINICAL DATA:  43 year old African-American male recently diagnosed with multiple myeloma whose chief complaint is lower back pain. Pt reported he started chemotherapy two days ago. EXAM: METASTATIC BONE SURVEY COMPARISON:  11/24/2018 CT chest, 11/21/2018 CT abdomen and pelvis FINDINGS: Images of the axial and  appendicular skeleton are performed. Calvarium:No discrete lytic or blastic lesions. Chest and Abdomen:Heart size is accentuated by technique. Soft tissue mass is identified along the LATERAL RIGHT UPPER lobe. There is associated destruction of the RIGHT third rib. No pulmonary edema. Mottled appearance of the ribs, better seen on CT exam. Spine:Marked compression fracture of T5. Mild anterior wedge compression fracture T12. Mild anterior wedge compression fractures of L1, L2, and L4, with approximately 10-15% loss of anterior height. The age of these fractures is indeterminate. UPPER extremities:No discrete lytic or blastic lesions. Pelvis and LOWER extremities:Mottled appearance of the bones of the pelvis. No acute fracture. Bone island within the distal LEFT femur. No discrete lytic or blastic lesions. IMPRESSION: 1. Soft tissue mass along the RIGHT LATERAL lobe UPPER lobe associated with osseous destruction of the RIGHT third rib. 2. Mottled appearance of the spine, ribs, and pelvis. 3. Compression fractures of T5, L1, and L2, and L4. Electronically Signed   By: [name] M.D.   On: 11/26/2018 14:49    Labs: BMET Recent Labs  Lab 11/21/18 0076  11/21/18 2155 11/22/18 0420 11/23/18 0404 11/24/18 0408 11/25/18 0405 11/26/18 0844 11/27/18 1300  NA 130*   < > 132* 132* 135 135 136 135 138  K 5.0   < > 4.8 4.7 4.7 4.8 5.0 4.1 3.9  CL 92*   < > 98 100 107 108 109 105 98  CO2 19*   < > 22 20* 18* 18* 15* 23 27  GLUCOSE 100*   < > 99 114* 91 97 198* 144* 134*  BUN 124*   < >  114* 110* 93* 66* 59* 56* 59*  CREATININE 10.32*   < > 10.05* 9.54* 8.37* 6.89* 6.06* 4.66* 4.14*  CALCIUM 13.7*  13.0*   < > 12.7* 12.1* 10.3 10.7* 10.0 9.2 9.3  PHOS 8.0*  --   --  8.3* 6.1* 5.2* 4.8*  5.0* 3.6  --    < > = values in this interval not displayed.   CBC Recent Labs  Lab 11/20/18 2325 11/22/18 0420 11/24/18 0408 11/25/18 0405 11/26/18 0845  WBC 14.1* 8.6 5.7 5.2 8.7  NEUTROABS 10.9*  --    --   --  7.3  HGB 13.4 10.9* 10.2* 9.8* 8.8*  HCT 38.8* 31.9* 30.2* 30.5* 26.9*  MCV 81.3 82.6 83.2 85.7 84.1  PLT 457* 390 328 278 276    Medications:    . lidocaine  1 patch Transdermal Q24H   Elmarie Shiley, MD 11/27/2018, 1:37 PM
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
## 2018-11-27 NOTE — Consult Note (Signed)
Chief Complaint: Patient was seen in consultation today for Bone Marrow biopsy Chief Complaint  Patient presents with   Abdominal Pain    sides of stomach and lower back    Back Pain   at the request of Dr Mayme Genta   Supervising Physician: Sandi Mariscal  Patient Status: Derek [name] - In-pt  History of Present Illness: Derek [name] Dimock is a 43 y.o. male   Hx back pain Worsened over last week or so-- especially after fall at home  Presented to ED Hypercalcemia  Initial imaging revealed rib lesions CT 9/4:  IMPRESSION: Lytic lesions in all imaged bones consistent with the patient's history of multiple myeloma. Expansile, destructive mass lesion in the right third rib is also likely due to myeloma. Pathologic compression fractures of T12 and T5. T5 fracture is worse where there is vertebral body height loss of 80-90% and mild bony retropulsion off the superior endplate. No resultant central canal Stenosis.  Bone scan: IMPRESSION: 1. Soft tissue mass along the RIGHT LATERAL lobe UPPER lobe associated with osseous destruction of the RIGHT third rib. 2. Mottled appearance of the spine, ribs, and pelvis. 3. Compression fractures of T5, L1, and L2, and L4.  Has had 2 chemotherapy treatments Dr Julien Nordmann requesting Bone Marrow biopsy for confirmation and treatment strategy   Past Medical History:  Diagnosis Date   Depression     History reviewed. No pertinent surgical history.  Allergies: Pork-derived products and Aspirin  Medications: Prior to Admission medications   Medication Sig Start Date End Date Taking? Authorizing Provider  baclofen (LIORESAL) 10 MG tablet Take 0.5-1 tablets (5-10 mg total) by mouth 3 (three) times daily as needed for muscle spasms. 11/01/18  Yes [name], Derek Como, MD  Biotin 1000 MCG CHEW Chew 1,000 mg by mouth daily.    Yes [provider]  ibuprofen (ADVIL) 200 MG tablet Take 200 mg by mouth every 6 (six) hours as needed.   Yes  [provider]  Multiple Vitamin (MULTIVITAMIN WITH MINERALS) TABS tablet Take 1 tablet by mouth daily.   Yes [provider]  nabumetone (RELAFEN) 750 MG tablet Take 1 tablet (750 mg total) by mouth 2 (two) times daily as needed. 11/17/18  Yes [name], Derek Como, MD  ondansetron (ZOFRAN) 4 MG tablet Take 1 tablet (4 mg total) by mouth every 8 (eight) hours as needed for nausea or vomiting. 11/17/18  Yes [name], Derek Como, MD  HYDROcodone-acetaminophen (NORCO/VICODIN) 5-325 MG tablet Take 1 tablet by mouth every 6 (six) hours as needed for moderate pain. Patient not taking: Reported on 11/20/2018 11/01/18   [name], Michael, MD  Magnesium Salicylate (DOANS PILLS PO) Take by mouth.    [provider]  methylPREDNISolone (MEDROL DOSEPAK) 4 MG TBPK tablet As directed for 6 days. Patient not taking: Reported on 11/20/2018 11/01/18   [name], Derek Como, MD     Family History  Problem Relation Age of Onset   Diabetes Maternal Grandmother     Social History   Socioeconomic History   Marital status: Legally Separated    Spouse name: Not on file   Number of children: Not on file   Years of education: Not on file   Highest education level: Not on file  Occupational History   Not on file  Social Needs   Financial resource strain: Not on file   Food insecurity    Worry: Not on file    Inability: Not on file   Transportation needs    Medical: Not on file  Non-medical: Not on file  Tobacco Use   Smoking status: Former Smoker    Types: Cigarettes    Quit date: 03/22/2005    Years since quitting: 13.6   Smokeless tobacco: Never Used  Substance and Sexual Activity   Alcohol use: No   Drug use: No   Sexual activity: Not on file  Lifestyle   Physical activity    Days per week: Not on file    Minutes per session: Not on file   Stress: Not on file  Relationships   Social connections    Talks on phone: Not on file    Gets together: Not on file    Attends  religious service: Not on file    Active member of club or organization: Not on file    Attends meetings of clubs or organizations: Not on file    Relationship status: Not on file  Other Topics Concern   Not on file  Social History Narrative   Not on file    Review of Systems: A 12 point ROS discussed and pertinent positives are indicated in the HPI above.  All other systems are negative.  Review of Systems  Constitutional: Positive for activity change. Negative for appetite change and fever.  Respiratory: Negative for shortness of breath.   Cardiovascular: Negative for chest pain.  Gastrointestinal: Negative for abdominal pain.  Musculoskeletal: Positive for back pain and gait problem.  Psychiatric/Behavioral: Negative for behavioral problems and confusion.    Vital Signs: BP 130/85 (BP Location: Left Arm)    Pulse 92    Temp 98 F (36.7 C) (Oral)    Resp 18    Ht 6' (1.829 m)    Wt 211 lb 10.3 oz (96 kg)    SpO2 98%    BMI 28.70 kg/m   Physical Exam Vitals signs reviewed.  Cardiovascular:     Rate and Rhythm: Normal rate and regular rhythm.  Pulmonary:     Breath sounds: Normal breath sounds.  Abdominal:     General: Bowel sounds are normal.     Tenderness: There is no abdominal tenderness.  Skin:    General: Skin is warm and dry.  Neurological:     Mental Status: He is alert and oriented to person, place, and time.  Psychiatric:        Behavior: Behavior normal.     Imaging: Ct Chest Wo Contrast  Result Date: 11/24/2018 CLINICAL DATA:  History of multiple myeloma. Destructive mass in the right third rib. Bilateral lower extremity weakness and falls. EXAM: CT CHEST WITHOUT CONTRAST TECHNIQUE: Multidetector CT imaging of the chest was performed following the standard protocol without IV contrast. COMPARISON:  Single-view of the chest 11/24/2018. Plain films of the ribs 11/17/2018. FINDINGS: Cardiovascular: Heart size is upper normal. No pericardial effusion. No aortic  aneurysm. Mediastinum/Nodes: No enlarged mediastinal or axillary lymph nodes. Thyroid gland, trachea, and esophagus demonstrate no significant findings. Lungs/Pleura: No pleural effusion. Mild dependent atelectasis. Lungs otherwise clear. Upper Abdomen: Negative. Musculoskeletal: Lytic lesions are seen in all imaged bones. A large destructive lesion with an associated soft tissue mass in the right third rib is identified. The mass measures approximately 8 cm AP by 4 cm transverse by 4 cm craniocaudal. Also seen is a mild superior endplate compression fracture of T12 with vertebral body height loss of approximately 10%. More severe compression fracture of T5 is identified with vertebral body height loss of up to 80-90%. There is mild bony retropulsion off the superior end  endplate of T5 but the central canal and foramina appear open. IMPRESSION: Lytic lesions in all imaged bones consistent with the patient's history of multiple myeloma. Expansile, destructive mass lesion in the right third rib is also likely due to myeloma. Pathologic compression fractures of T12 and T5. T5 fracture is worse where there is vertebral body height loss of 80-90% and mild bony retropulsion off the superior endplate. No resultant central canal stenosis. No acute cardiopulmonary disease. Electronically Signed   By: Inge Rise M.D.   On: 11/24/2018 20:15   Mr Lumbar Spine W/o Contrast  Result Date: 11/09/2018 CLINICAL DATA:  Back pain after fall on Monday. EXAM: MRI LUMBAR SPINE WITHOUT CONTRAST TECHNIQUE: Multiplanar, multisequence MR imaging of the lumbar spine was performed. No intravenous contrast was administered. COMPARISON:  Radiographs from 11/01/2018 FINDINGS: Segmentation: The lowest lumbar type non-rib-bearing vertebra is labeled as L5. Alignment:  No vertebral subluxation is observed. Vertebrae: Chronic mild anterior wedge compression fractures at T12 and L1. Disc desiccation at L4-5. No significant vertebral marrow  edema is identified. Conus medullaris and cauda equina: Conus extends to the L1 level. Conus and cauda equina appear normal. Paraspinal and other soft tissues: Unremarkable Disc levels: L1-2: Unremarkable. L2-3: Unremarkable. L3-4: Unremarkable L4-5: Borderline left subarticular lateral recess stenosis due to disc bulge, but without overt impingement. L5-S1: Unremarkable. IMPRESSION: 1. Disc bulge at L4-5 causing borderline left subarticular lateral recess stenosis but without overt impingement. 2. 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 Electronically Signed   By: Van Clines M.D.   On: 11/09/2018 08:23   US Renal  Result Date: 11/22/2018 CLINICAL DATA:  Acute kidney injury. EXAM: RENAL / URINARY TRACT ULTRASOUND COMPLETE COMPARISON:  CT scan of November 21, 2018. FINDINGS: Right Kidney: Renal measurements: 11.6 x 6.1 x 5.0 cm = volume: 183 mL. Increased echogenicity of renal parenchyma is noted. No mass or hydronephrosis visualized. Left Kidney: Renal measurements: 13.2 x 6.1 x 5.4 cm = volume: 229 mL. Increased echogenicity of renal parenchyma is noted. No mass or hydronephrosis visualized. Bladder: Appears normal for degree of bladder distention. IMPRESSION: Increased echogenicity of renal parenchyma is noted bilaterally suggesting medical renal disease. No hydronephrosis or renal obstruction is noted. Electronically Signed   By: Marijo Conception M.D.   On: 11/22/2018 14:24   Dg Chest Port 1 View  Result Date: 11/24/2018 CLINICAL DATA:  Fever. EXAM: PORTABLE CHEST 1 VIEW COMPARISON:  Rib radiographs 11/17/2018 FINDINGS: The cardiac silhouette is borderline enlarged. The lungs are mildly hypoinflated with similar appearance of mild interstitial coarsening. No confluent airspace opacity, overt pulmonary edema, sizable pleural effusion, or pneumothorax is identified. A soft tissue mass and associated destruction of the right lateral third rib are again seen. IMPRESSION: 1. Mild  hypoinflation.  No consolidation. 2. Destructive mass involving the right lateral third rib. Electronically Signed   By: Logan Bores M.D.   On: 11/24/2018 10:04   Dg Bone Survey Met  Result Date: 11/26/2018 CLINICAL DATA:  43 year old African-American male recently diagnosed with multiple myeloma whose chief complaint is lower back pain. Pt reported he started chemotherapy two days ago. EXAM: METASTATIC BONE SURVEY COMPARISON:  11/24/2018 CT chest, 11/21/2018 CT abdomen and pelvis FINDINGS: Images of the axial and appendicular skeleton are performed. Calvarium:No discrete lytic or blastic lesions. Chest and Abdomen:Heart size is accentuated by technique. Soft tissue mass is identified along the LATERAL RIGHT UPPER lobe. There is associated destruction of the RIGHT third rib. No pulmonary edema.  Mottled appearance of the ribs, better seen on CT exam. Spine:Marked compression fracture of T5. Mild anterior wedge compression fracture T12. Mild anterior wedge compression fractures of L1, L2, and L4, with approximately 10-15% loss of anterior height. The age of these fractures is indeterminate. UPPER extremities:No discrete lytic or blastic lesions. Pelvis and LOWER extremities:Mottled appearance of the bones of the pelvis. No acute fracture. Bone island within the distal LEFT femur. No discrete lytic or blastic lesions. IMPRESSION: 1. Soft tissue mass along the RIGHT LATERAL lobe UPPER lobe associated with osseous destruction of the RIGHT third rib. 2. Mottled appearance of the spine, ribs, and pelvis. 3. Compression fractures of T5, L1, and L2, and L4. Electronically Signed   By: [name] M.D.   On: 11/26/2018 14:49   Ct Renal Stone Study  Result Date: 11/21/2018 CLINICAL DATA:  Flank pain. Stone disease suspected. Patient reports low back pain with 3 falls in August. EXAM: CT ABDOMEN AND PELVIS WITHOUT CONTRAST TECHNIQUE: Multidetector CT imaging of the abdomen and pelvis was performed following the  standard protocol without IV contrast. COMPARISON:  Lumbar spine MRI 11/08/2018. FINDINGS: Lower chest: The lung bases are clear. Hepatobiliary: No focal liver abnormality is seen. No gallstones, gallbladder wall thickening, or biliary dilatation. Pancreas: No ductal dilatation or inflammation. Spleen: Normal in size without focal abnormality. Adrenals/Urinary Tract: Normal adrenal glands. No no hydronephrosis. Punctate nonobstructing stone in the lower left kidney. No perinephric edema. Both ureters are decompressed without stone along the course. Urinary bladder is physiologically distended, no bladder wall thickening or stone. Stomach/Bowel: Stomach is within normal limits. Appendix appears normal. No evidence of bowel wall thickening, distention, or inflammatory changes. Vascular/Lymphatic: Mild aortic atherosclerosis. No aneurysm. No enlarged lymph nodes in the abdomen or pelvis. Reproductive: Prostate is unremarkable. Other: No free air, free fluid, or intra-abdominal fluid collection. Tiny fat containing umbilical hernia. Musculoskeletal: Chronic superior endplate compression fractures of T12 and L1 prominent Schmorl's node superior endplate of L2. Bone marrow involving the spine and to a lesser extent pelvis is diffusely heterogeneous in generally low-density. Bone island in the right sacrum. IMPRESSION: 1. Punctate nonobstructing stone in the lower left kidney. No hydronephrosis or obstructive uropathy. 2. No acute abnormality in the abdomen/pelvis. 3. Diffusely heterogeneous and abnormal appearance of the bone marrow primarily involving the spine, suggesting underlying metabolic etiology, including but not limited to hyperparathyroidism. No focal abnormality was seen on recent lumbar spine MRI. Chronic mild compression deformities of T12 and L1 are unchanged. Aortic Atherosclerosis (ICD10-I70.0). Electronically Signed   By: Keith Rake M.D.   On: 11/21/2018 02:44    Labs:  CBC: Recent Labs     11/22/18 0420 11/24/18 0408 11/25/18 0405 11/26/18 0845  WBC 8.6 5.7 5.2 8.7  HGB 10.9* 10.2* 9.8* 8.8*  HCT 31.9* 30.2* 30.5* 26.9*  PLT 390 328 278 276    COAGS: No results for input(s): INR, APTT in the last 8760 hours.  BMP: Recent Labs    11/23/18 0404 11/24/18 0408 11/25/18 0405 11/26/18 0844  NA 135 135 136 135  K 4.7 4.8 5.0 4.1  CL 107 108 109 105  CO2 18* 18* 15* 23  GLUCOSE 91 97 198* 144*  BUN 93* 66* 59* 56*  CALCIUM 10.3 10.7* 10.0 9.2  CREATININE 8.37* 6.89* 6.06* 4.66*  GFRNONAA 7* 9* 10* 14*  GFRAA 8* 10* 12* 16*    LIVER FUNCTION TESTS: Recent Labs    11/20/18 2325 11/21/18 0923 11/23/18 0404 11/24/18 0408 11/25/18 0405 11/26/18  0844  BILITOT 0.8 0.8  --   --   --   --   AST 16 17  --   --   --   --   ALT 32 30  --   --   --   --   ALKPHOS 84 82  --   --   --   --   PROT 8.4* 8.0  --   --   --   --   ALBUMIN 4.4 4.0 3.5 3.3* 3.2* 3.1*    TUMOR MARKERS: No results for input(s): AFPTM, CEA, CA199, CHROMGRNA in the last 8760 hours.  Assessment and Plan:  Back pain and worsening New bony lesions-- probable Multiple Myeloma Has had chemotherapy x 2 Need for Bone marrow biopsy and confirmation/treatment sstratgy Scheduled for BM bx 9/8 in IR Risks and benefits of bone marrow bx was discussed with the patient and/or patient's family including, but not limited to bleeding, infection, damage to adjacent structures or low yield requiring additional tests.  All of the questions were answered and there is agreement to proceed.  Consent signed and in chart.   Thank you for this interesting consult.  I greatly enjoyed meeting Derek [name] Klutz and look forward to participating in their care.  A copy of this report was sent to the requesting provider on this date.  Electronically Signed: Lavonia Drafts, PA-C 11/27/2018, 7:46 AM   I spent a total of 20 Minutes    in face to face in clinical consultation, greater than 50% of which was  counseling/coordinating care for Bone marrow biopsy

## 2018-11-27 NOTE — Progress Notes (Signed)
PROGRESS NOTE    Derek [name]  AJO:[id number] DOB: 1975-09-22 DOA: 11/20/2018 PCP: Posey Boyer, MD   Brief Narrative: 43 y.o.malewith past medical history significant for back pain for which patient has been seeing orthopedic team.  Patient presented with nausea and anorexia.  On presentation to the hospital, patient was found to have serum creatinine of 10.85, BUN of 123, WBC count 14.1, total protein of 8.4, calcium of 13.6, albumin of 4.4 and UA showed 100 protein.  Work-up done was suggestive of multiple myeloma, with CT scan of the chest with contrast revealing lesions in all imaged bones, expansile and destructive mass lesion in the right third rib, pathologic compression fractures of T12 and T5.  Nephrology team and oncology team assisting with patient's management.  Patient is currently on IV steroids, Velcade and Cytoxan.  Renal function has improved significantly.  Serum creatinine is down to 4.66.  Patient has just undergone skeletal survey, and the result is pending.  Oncology team is directing care.   Assessment & Plan:   Principal Problem:   ARF (acute renal failure) (HCC) Active Problems:   Low back pain   Hypercalcemia   Acute renal failure (ARF) ([place])   AKI (acute kidney injury) ([place])   Multiple myeloma not having achieved remission ([place])   #1 multiple myeloma followed by oncology started on chemotherapy Cytoxan Velcade and steroid.  He finished his first cycle yesterday.  Starting Velcade second dose tomorrow.  Scheduled for bone marrow biopsy 11/28/2018.  Decadron 40 mg daily.  #2 AKI likely secondary to myeloma labs from today pending.  #3 T12-L1 compression fracture patient reports having a lot of pain and not being able to walk.  Abnormal bone density on CT scan.?  Pathologic fracture.  Continue pain control with Dilaudid and oxycodone.  #4 hyponatremia patient refuses to take sodium tablets and requested me to stop his sodium tablets as he feels very hot  while urinating when he takes sodium tablets.  I have stopped as per patient request we will follow-up BMP daily.  #5 hypercalcemia improving.  Calcium 9.2 9/6/ 2020.  Status post pamidronate and calcitonin.  11/22/2018.  #6 right anterior chest wall pain secondary to multiple myeloma CT of the chest 11/24/2018 with diffuse lytic lesions and destructive mass involving the right third rib pathology compression fractures at T12 and T5.  Bone survey 11/26/2018 shows osseous destruction of the right third rib compression fractures at T5 L1-L2 and L4   VTE Prophylaxis: SCD  Code Status: Full code Family Communication:  None  Disposition Plan:  Pending clinical improvement patient to have bone marrow biopsy tomorrow Consultants:  Nephrology Oncology.    Procedures:  None  Antibiotics: None   Estimated body mass index is 28.7 kg/m as calculated from the following:   Height as of this encounter: 6' (1.829 m).   Weight as of this encounter: 96 kg.  DVT prophylaxis: SCD due to Subjective: Resting in bed anxious about the procedure tomorrow requesting me to stop sodium tablets.  Complains of right sided anterior chest abdominal area pain due to rib fractures and does not want me to touch that area.  Objective: Vitals:   11/26/18 1400 11/26/18 2106 11/26/18 2152 11/27/18 0509  BP: (!) 136/98 (!) 141/84 (!) 147/88 130/85  Pulse: 70 (!) 105 93 92  Resp: '16 16  18  ' Temp: (!) 97.5 F (36.4 C) 97.9 F (36.6 C)  98 F (36.7 C)  TempSrc: Oral Oral  Oral  SpO2: 100%  100%  98%  Weight:    96 kg  Height:        Intake/Output Summary (Last 24 hours) at 11/27/2018 1131 Last data filed at 11/27/2018 1018 Gross per 24 hour  Intake 1783.84 ml  Output 2800 ml  Net -1016.16 ml   Filed Weights   11/25/18 0500 11/26/18 0455 11/27/18 0509  Weight: 95.3 kg 95.8 kg 96 kg    Examination:  General exam: Appears calm and comfortable  Respiratory system: Clear to auscultation. Respiratory effort  normal. Cardiovascular system: S1 & S2 heard, RRR. No JVD, murmurs, rubs, gallops or clicks. No pedal edema. Gastrointestinal system: Abdomen is nondistended, soft and right upper quadrant right anterior chest wall tender. No organomegaly or masses felt. Normal bowel sounds heard. Central nervous system: Alert and oriented. No focal neurological deficits. Extremities: Symmetric 5 x 5 power. Skin: No rashes, lesions or ulcers Psychiatry: Judgement and insight appear normal. Mood & affect appropriate.     Data Reviewed: I have personally reviewed following labs and imaging studies  CBC: Recent Labs  Lab 11/20/18 2325 11/22/18 0420 11/24/18 0408 11/25/18 0405 11/26/18 0845  WBC 14.1* 8.6 5.7 5.2 8.7  NEUTROABS 10.9*  --   --   --  7.3  HGB 13.4 10.9* 10.2* 9.8* 8.8*  HCT 38.8* 31.9* 30.2* 30.5* 26.9*  MCV 81.3 82.6 83.2 85.7 84.1  PLT 457* 390 328 278 696   Basic Metabolic Panel: Recent Labs  Lab 11/22/18 0420 11/23/18 0404 11/24/18 0408 11/25/18 0405 11/26/18 0844 11/26/18 0845  NA 132* 135 135 136 135  --   K 4.7 4.7 4.8 5.0 4.1  --   CL 100 107 108 109 105  --   CO2 20* 18* 18* 15* 23  --   GLUCOSE 114* 91 97 198* 144*  --   BUN 110* 93* 66* 59* 56*  --   CREATININE 9.54* 8.37* 6.89* 6.06* 4.66*  --   CALCIUM 12.1* 10.3 10.7* 10.0 9.2  --   MG 2.3  --   --  2.1  --  2.0  PHOS 8.3* 6.1* 5.2* 4.8*  5.0* 3.6  --    GFR: Estimated Creatinine Clearance: 24.3 mL/min (A) (by C-G formula based on SCr of 4.66 mg/dL (H)). Liver Function Tests: Recent Labs  Lab 11/20/18 2325 11/21/18 0923 11/23/18 0404 11/24/18 0408 11/25/18 0405 11/26/18 0844  AST 16 17  --   --   --   --   ALT 32 30  --   --   --   --   ALKPHOS 84 82  --   --   --   --   BILITOT 0.8 0.8  --   --   --   --   PROT 8.4* 8.0  --   --   --   --   ALBUMIN 4.4 4.0 3.5 3.3* 3.2* 3.1*   Recent Labs  Lab 11/20/18 2325  LIPASE 58*   No results for input(s): AMMONIA in the last 168 hours.  Coagulation Profile: No results for input(s): INR, PROTIME in the last 168 hours. Cardiac Enzymes: Recent Labs  Lab 11/22/18 0420  CKTOTAL 92   BNP (last 3 results) No results for input(s): PROBNP in the last 8760 hours. HbA1C: No results for input(s): HGBA1C in the last 72 hours. CBG: Recent Labs  Lab 11/26/18 0834 11/26/18 1151 11/26/18 1607 11/26/18 2101 11/27/18 0717  GLUCAP 130* 155* 109* 158* 98   Lipid Profile: No results for input(s):  CHOL, HDL, LDLCALC, TRIG, CHOLHDL, LDLDIRECT in the last 72 hours. Thyroid Function Tests: No results for input(s): TSH, T4TOTAL, FREET4, T3FREE, THYROIDAB in the last 72 hours. Anemia Panel: No results for input(s): VITAMINB12, FOLATE, FERRITIN, TIBC, IRON, RETICCTPCT in the last 72 hours. Sepsis Labs: No results for input(s): PROCALCITON, LATICACIDVEN in the last 168 hours.  Recent Results (from the past 240 hour(s))  SARS CORONAVIRUS 2 (TAT 6-24 HRS) Nasopharyngeal Nasopharyngeal Swab     Status: None   Collection Time: 11/21/18  8:50 PM   Specimen: Nasopharyngeal Swab  Result Value Ref Range Status   SARS Coronavirus 2 NEGATIVE NEGATIVE Final    Comment: (NOTE) SARS-CoV-2 target nucleic acids are NOT DETECTED. The SARS-CoV-2 RNA is generally detectable in upper and lower respiratory specimens during the acute phase of infection. Negative results do not preclude SARS-CoV-2 infection, do not rule out co-infections with other pathogens, and should not be used as the sole basis for treatment or other patient management decisions. Negative results must be combined with clinical observations, patient history, and epidemiological information. The expected result is Negative. Fact Sheet for Patients: SugarRoll.be Fact Sheet for Healthcare Providers: https://www.woods-mathews.com/ This test is not yet approved or cleared by the [place] FDA and  has been authorized for detection and/or  diagnosis of SARS-CoV-2 by FDA under an Emergency Use Authorization (EUA). This EUA will remain  in effect (meaning this test can be used) for the duration of the COVID-19 declaration under Section 56 4(b)(1) of the Act, 21 U.S.C. section 360bbb-3(b)(1), unless the authorization is terminated or revoked sooner. Performed at [facility], [place] [address], [place], [place] [address]   Culture, Urine     Status: Abnormal   Collection Time: 11/24/18  7:22 AM   Specimen: Urine, Clean Catch  Result Value Ref Range Status   Specimen Description   Final    URINE, CLEAN CATCH Performed at [facility], [place] [address], [place], [place] [address]    Special Requests   Final    NONE Performed at [facility], [place] [address], [place], [place] [address]    Culture (A)  Final    60,000 COLONIES/mL MULTIPLE SPECIES PRESENT, SUGGEST RECOLLECTION   Report Status 11/25/2018 FINAL  Final  Culture, blood (routine x 2)     Status: None (Preliminary result)   Collection Time: 11/24/18  7:57 AM   Specimen: BLOOD LEFT HAND  Result Value Ref Range Status   Specimen Description   Final    BLOOD LEFT HAND Performed at [facility] [address], [place], [place] [address]    Special Requests   Final    BOTTLES DRAWN AEROBIC AND ANAEROBIC Blood Culture adequate volume Performed at [facility] [address] [place], [place] [address]    Culture   Final    NO GROWTH 3 DAYS Performed at [facility], [place] [address] [place], [place] [address]    Report Status PENDING  Incomplete  Culture, blood (routine x 2)     Status: None (Preliminary result)   Collection Time: 11/24/18  7:58 AM   Specimen: BLOOD RIGHT HAND  Result Value Ref Range Status   Specimen Description   Final    BLOOD RIGHT HAND Performed at [facility] [address] [place],  [address]    Special Requests   Final     BOTTLES DRAWN AEROBIC AND ANAEROBIC Blood Culture adequate volume Performed  at [facility], [place] [address], [place], [place] [address]    Culture   Final    NO GROWTH 3 DAYS Performed at [facility], [place] [address], [place], [place] [address]    Report Status PENDING  Incomplete         Radiology Studies: Dg Bone Survey Met  Result Date: 11/26/2018 CLINICAL DATA:  43 year old African-American male recently diagnosed with multiple myeloma whose chief complaint is lower back pain. Pt reported he started chemotherapy two days ago. EXAM: METASTATIC BONE SURVEY COMPARISON:  11/24/2018 CT chest, 11/21/2018 CT abdomen and pelvis FINDINGS: Images of the axial and appendicular skeleton are performed. Calvarium:No discrete lytic or blastic lesions. Chest and Abdomen:Heart size is accentuated by technique. Soft tissue mass is identified along the LATERAL RIGHT UPPER lobe. There is associated destruction of the RIGHT third rib. No pulmonary edema. Mottled appearance of the ribs, better seen on CT exam. Spine:Marked compression fracture of T5. Mild anterior wedge compression fracture T12. Mild anterior wedge compression fractures of L1, L2, and L4, with approximately 10-15% loss of anterior height. The age of these fractures is indeterminate. UPPER extremities:No discrete lytic or blastic lesions. Pelvis and LOWER extremities:Mottled appearance of the bones of the pelvis. No acute fracture. Bone island within the distal LEFT femur. No discrete lytic or blastic lesions. IMPRESSION: 1. Soft tissue mass along the RIGHT LATERAL lobe UPPER lobe associated with osseous destruction of the RIGHT third rib. 2. Mottled appearance of the spine, ribs, and pelvis. 3. Compression fractures of T5, L1, and L2, and L4. Electronically Signed   By: [name] M.D.   On: 11/26/2018 14:49        Scheduled Meds: . lidocaine  1 patch Transdermal Q24H   Continuous Infusions: .  sodium  bicarbonate (isotonic) infusion in sterile water 100 mL/hr at 11/26/18 2235     LOS: 6 days      Georgette Shell, MD [facility]  If 7PM-7AM, please contact night-coverage [email or web address] Password [facility] 11/27/2018, 11:31 AM

## 2018-11-28 ENCOUNTER — Inpatient Hospital Stay (HOSPITAL_COMMUNITY): Payer: BC Managed Care – PPO

## 2018-11-28 DIAGNOSIS — N178 Other acute kidney failure: Secondary | ICD-10-CM

## 2018-11-28 LAB — CBC
HCT: 27 % — ABNORMAL LOW (ref 39.0–52.0)
Hemoglobin: 8.9 g/dL — ABNORMAL LOW (ref 13.0–17.0)
MCH: 28.3 pg (ref 26.0–34.0)
MCHC: 33 g/dL (ref 30.0–36.0)
MCV: 85.7 fL (ref 80.0–100.0)
Platelets: 313 10*3/uL (ref 150–400)
RBC: 3.15 MIL/uL — ABNORMAL LOW (ref 4.22–5.81)
RDW: 13.6 % (ref 11.5–15.5)
WBC: 9.6 10*3/uL (ref 4.0–10.5)
nRBC: 0 % (ref 0.0–0.2)

## 2018-11-28 LAB — BASIC METABOLIC PANEL
Anion gap: 12 (ref 5–15)
BUN: 60 mg/dL — ABNORMAL HIGH (ref 6–20)
CO2: 29 mmol/L (ref 22–32)
Calcium: 9 mg/dL (ref 8.9–10.3)
Chloride: 98 mmol/L (ref 98–111)
Creatinine, Ser: 3.96 mg/dL — ABNORMAL HIGH (ref 0.61–1.24)
GFR calc Af Amer: 20 mL/min — ABNORMAL LOW (ref >60)
GFR calc non Af Amer: 17 mL/min — ABNORMAL LOW (ref >60)
Glucose, Bld: 109 mg/dL — ABNORMAL HIGH (ref 70–99)
Potassium: 4.3 mmol/L (ref 3.5–5.1)
Sodium: 139 mmol/L (ref 135–145)

## 2018-11-28 LAB — PROTIME-INR
INR: 1.1 (ref 0.8–1.2)
Prothrombin Time: 13.8 seconds (ref 11.4–15.2)

## 2018-11-28 LAB — GLUCOSE, CAPILLARY: Glucose-Capillary: 95 mg/dL (ref 70–99)

## 2018-11-28 MED ORDER — MIDAZOLAM HCL 2 MG/2ML IJ SOLN
INTRAMUSCULAR | Status: AC | PRN
  Administered 2018-11-28 (×4): 1 mg via INTRAVENOUS

## 2018-11-28 MED ORDER — NALOXONE HCL 0.4 MG/ML IJ SOLN
INTRAMUSCULAR | Status: AC
  Filled 2018-11-28: qty 1

## 2018-11-28 MED ORDER — FLUMAZENIL 0.5 MG/5ML IV SOLN
INTRAVENOUS | Status: AC
  Filled 2018-11-28: qty 5

## 2018-11-28 MED ORDER — SODIUM CHLORIDE 0.9 % IV SOLN
INTRAVENOUS | Status: AC
  Administered 2018-11-28: 14:00:00 via INTRAVENOUS

## 2018-11-28 MED ORDER — MIDAZOLAM HCL 2 MG/2ML IJ SOLN
INTRAMUSCULAR | Status: AC
  Filled 2018-11-28: qty 4

## 2018-11-28 MED ORDER — FENTANYL CITRATE (PF) 100 MCG/2ML IJ SOLN
INTRAMUSCULAR | Status: AC
  Filled 2018-11-28: qty 4

## 2018-11-28 MED ORDER — FENTANYL CITRATE (PF) 100 MCG/2ML IJ SOLN
INTRAMUSCULAR | Status: AC | PRN
  Administered 2018-11-28 (×3): 50 ug via INTRAVENOUS

## 2018-11-28 MED ORDER — LIDOCAINE HCL (PF) 1 % IJ SOLN
INTRAMUSCULAR | Status: AC | PRN
  Administered 2018-11-28: 10 mL

## 2018-11-28 MED ORDER — PROCHLORPERAZINE MALEATE 10 MG PO TABS
10.0000 mg | ORAL_TABLET | Freq: Once | ORAL | Status: AC
  Administered 2018-11-28: 10 mg via ORAL
  Filled 2018-11-28: qty 1

## 2018-11-28 MED ORDER — BORTEZOMIB CHEMO SQ INJECTION 3.5 MG (2.5MG/ML)
1.3000 mg/m2 | Freq: Once | INTRAMUSCULAR | Status: AC
  Administered 2018-11-28: 2.75 mg via SUBCUTANEOUS
  Filled 2018-11-28: qty 1.1

## 2018-11-28 NOTE — Progress Notes (Signed)
PROGRESS NOTE    Derek [name]  AQT:[id number] DOB: [birth year] DOA: 11/20/2018 PCP: Posey Boyer, MD    Brief Narrative: 43 y.o.malewithpast medical history significant for back pain for which patient has been seeing orthopedic team. Patient presented with nausea and anorexia. On presentation to the hospital, patient was found to have serumcreatinine of 10.85,BUNof123,WBC count 14.1,total proteinof8.4,calciumof13.6,albumin of4.4andUA showed100 protein. Work-up done was suggestive of multiple myeloma, with CT scan of the chest with contrast revealing lesions in all imaged bones, expansile and destructive mass lesion in the right third rib, pathologic compression fractures of T12 and T5. Nephrology team and oncology team assisting with patient's management. Patient is currently on IV steroids, Velcade and Cytoxan. Renal function has improved significantly. Serum creatinine is down to 4.66. Patient has just undergone skeletal survey, and the result is pending. Oncology team is directing care.   Assessment & Plan:   Principal Problem:   ARF (acute renal failure) (HCC) Active Problems:   Low back pain   Hypercalcemia   Acute renal failure (ARF) ([place])   AKI (acute kidney injury) ([place])   Multiple myeloma not having achieved remission ([place])   #1 multiple myeloma followed by oncology started on chemotherapy Cytoxan Velcade and steroid.  He finished his first cycle   Starting Velcade second dose 11/28/2018 Scheduled for bone marrow biopsy 11/28/2018.  Decadron 40 mg daily.  #2 AKI likely secondary to myeloma improving  #3 T12-L1 compression fracture patient reports having a lot of pain and not being able to walk.  Abnormal bone density on CT scan.?  Pathologic fracture.  Continue pain control with Dilaudid and oxycodone.  #4 hyponatremia resolved patient refuses to take sodium tablets and requested me to stop his sodium tablets as he feels very hot while  urinating when he takes sodium tablets.  I have stopped as per patient request we will follow-up BMP daily.  Sodium normalized.  #5 hypercalcemia resolved  Calcium 9.2 9/6/ 2020.  Status post pamidronate and calcitonin.  11/22/2018.  #6 right anterior chest wall pain secondary to multiple myeloma CT of the chest 11/24/2018 with diffuse lytic lesions and destructive mass involving the right third rib pathology compression fractures at T12 and T5.  Bone survey 11/26/2018 shows osseous destruction of the right third rib compression fractures at T5 L1-L2 and L4   VTE Prophylaxis: SCD  Code Status:Full code Family Communication: None  Disposition Plan: Pending clinical improvement patient to have bone marrow biopsy 11/28/2018 Consultants:  Nephrology, oncology   Procedures none  Antibiotics none    Estimated body mass index is 29.36 kg/m as calculated from the following:   Height as of this encounter: 6' (1.829 m).   Weight as of this encounter: 98.2 kg.  Subjective: Complains of feeling thirsty and being n.p.o. just want to finish the procedure so he can start eating moving his lower extremities in bed no nausea vomiting diarrhea or other complaints reported  Objective: Vitals:   11/27/18 1255 11/27/18 2154 11/28/18 0500 11/28/18 0626  BP: (!) 148/82 (!) 146/84  (!) 133/94  Pulse: 78 99  86  Resp: _0 Temp: 97.8 F (36.6 C) 98.1 F (36.7 C)  98.2 F (36.8 C)  TempSrc: Oral Oral  Oral  SpO2: 100% 96%  97%  Weight:   98.2 kg   Height:        Intake/Output Summary (Last 24 hours) at 11/28/2018 1024 Last data filed at 11/28/2018 0834 Gross per 24 hour  Intake 1414.63 ml  Output 6025 ml  Net -4610.37 ml   Filed Weights   11/26/18 0455 11/27/18 0509 11/28/18 0500  Weight: 95.8 kg 96 kg 98.2 kg    Examination:  General exam: Appears calm and comfortable  Respiratory system: Clear to auscultation. Respiratory effort normal. Cardiovascular system: S1 & S2 heard,  RRR. No JVD, murmurs, rubs, gallops or clicks. No pedal edema. Gastrointestinal system: Abdomen is nondistended, soft and tender right upper quadrant right anterior chest no organomegaly or masses felt. Normal bowel sounds heard. Central nervous system: Alert and oriented. No focal neurological deficits. Extremities: Symmetric 5 x 5 power. Skin: No rashes, lesions or ulcers Psychiatry: Judgement and insight appear normal. Mood & affect appropriate.     Data Reviewed: I have personally reviewed following labs and imaging studies  CBC: Recent Labs  Lab 11/22/18 0420 11/24/18 0408 11/25/18 0405 11/26/18 0845 11/28/18 0751  WBC 8.6 5.7 5.2 8.7 9.6  NEUTROABS  --   --   --  7.3  --   HGB 10.9* 10.2* 9.8* 8.8* 8.9*  HCT 31.9* 30.2* 30.5* 26.9* 27.0*  MCV 82.6 83.2 85.7 84.1 85.7  PLT 390 328 278 276 031   Basic Metabolic Panel: Recent Labs  Lab 11/22/18 0420 11/23/18 0404 11/24/18 0408 11/25/18 0405 11/26/18 0844 11/26/18 0845 11/27/18 1300 11/28/18 0402  NA 132* 135 135 136 135  --  138 139  K 4.7 4.7 4.8 5.0 4.1  --  3.9 4.3  CL 100 107 108 109 105  --  98 98  CO2 20* 18* 18* 15* 23  --  27 29  GLUCOSE 114* 91 97 198* 144*  --  134* 109*  BUN 110* 93* 66* 59* 56*  --  59* 60*  CREATININE 9.54* 8.37* 6.89* 6.06* 4.66*  --  4.14* 3.96*  CALCIUM 12.1* 10.3 10.7* 10.0 9.2  --  9.3 9.0  MG 2.3  --   --  2.1  --  2.0  --   --   PHOS 8.3* 6.1* 5.2* 4.8*  5.0* 3.6  --   --   --    GFR: Estimated Creatinine Clearance: 28.9 mL/min (A) (by C-G formula based on SCr of 3.96 mg/dL (H)). Liver Function Tests: Recent Labs  Lab 11/23/18 0404 11/24/18 0408 11/25/18 0405 11/26/18 0844  ALBUMIN 3.5 3.3* 3.2* 3.1*   No results for input(s): LIPASE, AMYLASE in the last 168 hours. No results for input(s): AMMONIA in the last 168 hours. Coagulation Profile: Recent Labs  Lab 11/28/18 0402  INR 1.1   Cardiac Enzymes: Recent Labs  Lab 11/22/18 0420  CKTOTAL 92   BNP (last 3  results) No results for input(s): PROBNP in the last 8760 hours. HbA1C: No results for input(s): HGBA1C in the last 72 hours. CBG: Recent Labs  Lab 11/27/18 0717 11/27/18 1212 11/27/18 1852 11/27/18 2149 11/28/18 0809  GLUCAP 98 117* 96 160* 95   Lipid Profile: No results for input(s): CHOL, HDL, LDLCALC, TRIG, CHOLHDL, LDLDIRECT in the last 72 hours. Thyroid Function Tests: No results for input(s): TSH, T4TOTAL, FREET4, T3FREE, THYROIDAB in the last 72 hours. Anemia Panel: No results for input(s): VITAMINB12, FOLATE, FERRITIN, TIBC, IRON, RETICCTPCT in the last 72 hours. Sepsis Labs: No results for input(s): PROCALCITON, LATICACIDVEN in the last 168 hours.  Recent Results (from the past 240 hour(s))  SARS CORONAVIRUS 2 (TAT 6-24 HRS) Nasopharyngeal Nasopharyngeal Swab     Status: None   Collection Time: 11/21/18  8:50 PM   Specimen: Nasopharyngeal Swab  Result Value Ref Range Status   SARS Coronavirus 2 NEGATIVE NEGATIVE Final    Comment: (NOTE) SARS-CoV-2 target nucleic acids are NOT DETECTED. The SARS-CoV-2 RNA is generally detectable in upper and lower respiratory specimens during the acute phase of infection. Negative results do not preclude SARS-CoV-2 infection, do not rule out co-infections with other pathogens, and should not be used as the sole basis for treatment or other patient management decisions. Negative results must be combined with clinical observations, patient history, and epidemiological information. The expected result is Negative. Fact Sheet for Patients: SugarRoll.be Fact Sheet for Healthcare Providers: https://www.woods-[place].com/ This test is not yet approved or cleared by the [place] FDA and  has been authorized for detection and/or diagnosis of SARS-CoV-2 by FDA under an Emergency Use Authorization (EUA). This EUA will remain  in effect (meaning this test can be used) for the duration of the  COVID-19 declaration under Section 56 4(b)(1) of the Act, 21 U.S.C. section 360bbb-3(b)(1), unless the authorization is terminated or revoked sooner. Performed at [facility], [place] [address] [place], [place] [address]   Culture, Urine     Status: Abnormal   Collection Time: 11/24/18  7:22 AM   Specimen: Urine, Clean Catch  Result Value Ref Range Status   Specimen Description   Final    URINE, CLEAN CATCH Performed at [facility], [place] [address], [place], [place] [address]    Special Requests   Final    NONE Performed at [facility], [place] [address], [place], [place] [address]    Culture (A)  Final    60,000 COLONIES/mL MULTIPLE SPECIES PRESENT, SUGGEST RECOLLECTION   Report Status 11/25/2018 FINAL  Final  Culture, blood (routine x 2)     Status: None (Preliminary result)   Collection Time: 11/24/18  7:57 AM   Specimen: BLOOD LEFT HAND  Result Value Ref Range Status   Specimen Description   Final    BLOOD LEFT HAND Performed at [facility] [address], [place], [place] [address]    Special Requests   Final    BOTTLES DRAWN AEROBIC AND ANAEROBIC Blood Culture adequate volume Performed at [facility] [address], [place], [place] [address]    Culture   Final    NO GROWTH 4 DAYS Performed at [facility], [place] [address] [place], [place] [address]    Report Status PENDING  Incomplete  Culture, blood (routine x 2)     Status: None (Preliminary result)   Collection Time: 11/24/18  7:58 AM   Specimen: BLOOD RIGHT HAND  Result Value Ref Range Status   Specimen Description   Final    BLOOD RIGHT HAND Performed at [facility] [address], [place], [place] [address]    Special Requests   Final    BOTTLES DRAWN AEROBIC AND ANAEROBIC Blood Culture adequate volume Performed at [facility] [address], [place], [place] [address]     Culture   Final    NO GROWTH 4 DAYS Performed at [facility], [place] [address], [place], [place] [address]    Report Status PENDING  Incomplete         Radiology Studies: Dg Bone Survey Met  Result Date: 11/26/2018 CLINICAL DATA:  43 year old African-American male recently diagnosed with multiple myeloma whose chief complaint is lower back pain. Pt reported he started chemotherapy two days ago. EXAM: METASTATIC BONE  SURVEY COMPARISON:  11/24/2018 CT chest, 11/21/2018 CT abdomen and pelvis FINDINGS: Images of the axial and appendicular skeleton are performed. Calvarium:No discrete lytic or blastic lesions. Chest and Abdomen:Heart size is accentuated by technique. Soft tissue mass is identified along the LATERAL RIGHT UPPER lobe. There is associated destruction of the RIGHT third rib. No pulmonary edema. Mottled appearance of the ribs, better seen on CT exam. Spine:Marked compression fracture of T5. Mild anterior wedge compression fracture T12. Mild anterior wedge compression fractures of L1, L2, and L4, with approximately 10-15% loss of anterior height. The age of these fractures is indeterminate. UPPER extremities:No discrete lytic or blastic lesions. Pelvis and LOWER extremities:Mottled appearance of the bones of the pelvis. No acute fracture. Bone island within the distal LEFT femur. No discrete lytic or blastic lesions. IMPRESSION: 1. Soft tissue mass along the RIGHT LATERAL lobe UPPER lobe associated with osseous destruction of the RIGHT third rib. 2. Mottled appearance of the spine, ribs, and pelvis. 3. Compression fractures of T5, L1, and L2, and L4. Electronically Signed   By: [name] M.D.   On: 11/26/2018 14:49        Scheduled Meds: . bortezomib SQ  1.3 mg/m2 (Treatment Plan Recorded) Subcutaneous Once  . lidocaine  1 patch Transdermal Q24H  . prochlorperazine  10 mg Oral Once   Continuous Infusions: .  sodium bicarbonate (isotonic) infusion in sterile water 100 mL/hr  at 11/28/18 0610     LOS: 7 days     Georgette Shell, MD [facility]  If 7PM-7AM, please contact night-coverage [email or web address] Password [facility] 11/28/2018, 10:24 AM

## 2018-11-28 NOTE — Progress Notes (Signed)
Patient ID: Derek [name], male   DOB: 06-18-1975, 43 y.o.   MRN: [id number] [facility] Progress Note   Assessment/ Plan:   1. Acute kidney Injury: Likely secondary to multiple myeloma versus hemodynamically mediated renal injury in the setting of hypercalcemia and ongoing NSAIDs.  Creatinine continues to show downward trend with improving urine output with ongoing intravenous fluid support; will switch this to normal saline.  He appears to be stable from a renal standpoint to discharge when deemed appropriate by oncology. 2.  Multiple myeloma: Started on chemotherapy with Decadron, cyclophosphamide and Velcade.  Bone marrow biopsy done earlier today and scheduled to get the second dose of Velcade today. 3.  Hypercalcemia: Secondary to multiple myeloma, corrected with pamidronate and isotonic fluids. 4.  Anion gap metabolic acidosis: Secondary to acute kidney injury, corrected with isotonic sodium bicarbonate-we will switch to normal saline beginning today as he is now getting alkalemic.  Subjective:   Reports to be feeling fair and tolerated bone marrow biopsy earlier today without problems.  Inquiring about disposition and ability to work when he leaves the hospital.   Objective:   BP (!) 143/91 (BP Location: Left Arm)   Pulse 82   Temp 98.2 F (36.8 C) (Oral)   Resp 16   Ht 6' (1.829 m)   Wt 98.2 kg   SpO2 98%   BMI 29.36 kg/m   Intake/Output Summary (Last 24 hours) at 11/28/2018 1229 Last data filed at 11/28/2018 1132 Gross per 24 hour  Intake 1414.63 ml  Output 7025 ml  Net -5610.37 ml   Weight change: 2.2 kg  Physical Exam: Gen: Comfortably resting in bed, just back from bone marrow biopsy CVS: Pulse regular rhythm, normal rate, S1 and S2 normal Resp: Clear to auscultation, no rales/rhonchi Abd: Soft, obese, nontender Ext: No lower extremity edema  Imaging: Dg Bone Survey Met  Result Date: 11/26/2018 CLINICAL DATA:  43 year old African-American male  recently diagnosed with multiple myeloma whose chief complaint is lower back pain. Pt reported he started chemotherapy two days ago. EXAM: METASTATIC BONE SURVEY COMPARISON:  11/24/2018 CT chest, 11/21/2018 CT abdomen and pelvis FINDINGS: Images of the axial and appendicular skeleton are performed. Calvarium:No discrete lytic or blastic lesions. Chest and Abdomen:Heart size is accentuated by technique. Soft tissue mass is identified along the LATERAL RIGHT UPPER lobe. There is associated destruction of the RIGHT third rib. No pulmonary edema. Mottled appearance of the ribs, better seen on CT exam. Spine:Marked compression fracture of T5. Mild anterior wedge compression fracture T12. Mild anterior wedge compression fractures of L1, L2, and L4, with approximately 10-15% loss of anterior height. The age of these fractures is indeterminate. UPPER extremities:No discrete lytic or blastic lesions. Pelvis and LOWER extremities:Mottled appearance of the bones of the pelvis. No acute fracture. Bone island within the distal LEFT femur. No discrete lytic or blastic lesions. IMPRESSION: 1. Soft tissue mass along the RIGHT LATERAL lobe UPPER lobe associated with osseous destruction of the RIGHT third rib. 2. Mottled appearance of the spine, ribs, and pelvis. 3. Compression fractures of T5, L1, and L2, and L4. Electronically Signed   By: [name] M.D.   On: 11/26/2018 14:49    Labs: BMET Recent Labs  Lab 11/22/18 0420 11/23/18 0404 11/24/18 0408 11/25/18 0405 11/26/18 0844 11/27/18 1300 11/28/18 0402  NA 132* 135 135 136 135 138 139  K 4.7 4.7 4.8 5.0 4.1 3.9 4.3  CL 100 107 108 109 105 98 98  CO2 20*  18* 18* 15* _0 GLUCOSE 114* 91 97 198* 144* 134* 109*  BUN 110* 93* 66* 59* 56* 59* 60*  CREATININE 9.54* 8.37* 6.89* 6.06* 4.66* 4.14* 3.96*  CALCIUM 12.1* 10.3 10.7* 10.0 9.2 9.3 9.0  PHOS 8.3* 6.1* 5.2* 4.8*  5.0* 3.6  --   --    CBC Recent Labs  Lab 11/24/18 0408 11/25/18 0405  11/26/18 0845 11/28/18 0751  WBC 5.7 5.2 8.7 9.6  NEUTROABS  --   --  7.3  --   HGB 10.2* 9.8* 8.8* 8.9*  HCT 30.2* 30.5* 26.9* 27.0*  MCV 83.2 85.7 84.1 85.7  PLT 328 278 276 313    Medications:    . bortezomib SQ  1.3 mg/m2 (Treatment Plan Recorded) Subcutaneous Once  . flumazenil      . lidocaine  1 patch Transdermal Q24H  . naloxone      . prochlorperazine  10 mg Oral Once   Elmarie Shiley, MD 11/28/2018, 12:29 PM

## 2018-11-28 NOTE — Procedures (Signed)
Interventional Radiology Procedure Note  Procedure: CT guided aspirate and core biopsy of right iliac bone Complications: None Recommendations: - Bedrest supine x 1 hrs - Hydrocodone PRN  Pain - Follow biopsy results  Signed,  Andriel Omalley K. Thorin Starner, MD   
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
## 2018-11-28 NOTE — Progress Notes (Signed)
Patient educated on safety protocol and having bed left in lowest position at all times.  Patient refuses this protocol - states "I feel like i'm in a hole and people are talking down to me"  Bed left in position as patient wishes, instructed to call before getting OOB for assistance if needed, though.  Verbalizes understanding.

## 2018-11-29 ENCOUNTER — Other Ambulatory Visit: Payer: Self-pay | Admitting: Nurse Practitioner

## 2018-11-29 DIAGNOSIS — C9000 Multiple myeloma not having achieved remission: Secondary | ICD-10-CM

## 2018-11-29 LAB — RENAL FUNCTION PANEL
Albumin: 3.2 g/dL — ABNORMAL LOW (ref 3.5–5.0)
Anion gap: 10 (ref 5–15)
BUN: 48 mg/dL — ABNORMAL HIGH (ref 6–20)
CO2: 26 mmol/L (ref 22–32)
Calcium: 8.7 mg/dL — ABNORMAL LOW (ref 8.9–10.3)
Chloride: 100 mmol/L (ref 98–111)
Creatinine, Ser: 3.47 mg/dL — ABNORMAL HIGH (ref 0.61–1.24)
GFR calc Af Amer: 23 mL/min — ABNORMAL LOW (ref >60)
GFR calc non Af Amer: 20 mL/min — ABNORMAL LOW (ref >60)
Glucose, Bld: 94 mg/dL (ref 70–99)
Phosphorus: 2.9 mg/dL (ref 2.5–4.6)
Potassium: 4.6 mmol/L (ref 3.5–5.1)
Sodium: 136 mmol/L (ref 135–145)

## 2018-11-29 LAB — CULTURE, BLOOD (ROUTINE X 2)
Culture: NO GROWTH
Culture: NO GROWTH
Special Requests: ADEQUATE
Special Requests: ADEQUATE

## 2018-11-29 LAB — CBC
HCT: 30.8 % — ABNORMAL LOW (ref 39.0–52.0)
Hemoglobin: 9.9 g/dL — ABNORMAL LOW (ref 13.0–17.0)
MCH: 27.7 pg (ref 26.0–34.0)
MCHC: 32.1 g/dL (ref 30.0–36.0)
MCV: 86 fL (ref 80.0–100.0)
Platelets: 276 10*3/uL (ref 150–400)
RBC: 3.58 MIL/uL — ABNORMAL LOW (ref 4.22–5.81)
RDW: 13.4 % (ref 11.5–15.5)
WBC: 7.3 10*3/uL (ref 4.0–10.5)
nRBC: 0 % (ref 0.0–0.2)

## 2018-11-29 MED ORDER — HYDROMORPHONE HCL 1 MG/ML IJ SOLN
0.5000 mg | Freq: Once | INTRAMUSCULAR | Status: AC
  Administered 2018-11-29: 0.5 mg via INTRAVENOUS
  Filled 2018-11-29: qty 0.5

## 2018-11-29 MED ORDER — METOPROLOL TARTRATE 5 MG/5ML IV SOLN
5.0000 mg | Freq: Four times a day (QID) | INTRAVENOUS | Status: DC | PRN
  Administered 2018-11-29 – 2018-12-03 (×5): 5 mg via INTRAVENOUS
  Filled 2018-11-29 (×5): qty 5

## 2018-11-29 MED ORDER — ENOXAPARIN SODIUM 60 MG/0.6ML ~~LOC~~ SOLN
50.0000 mg | SUBCUTANEOUS | Status: DC
  Administered 2018-12-01: 50 mg via SUBCUTANEOUS
  Filled 2018-11-29 (×3): qty 0.6

## 2018-11-29 MED ORDER — HYDROMORPHONE HCL 1 MG/ML IJ SOLN
1.0000 mg | INTRAMUSCULAR | Status: DC | PRN
  Administered 2018-11-29 – 2018-11-30 (×6): 1 mg via INTRAVENOUS
  Filled 2018-11-29 (×8): qty 1

## 2018-11-29 MED ORDER — ENOXAPARIN SODIUM 40 MG/0.4ML ~~LOC~~ SOLN: 40.0000 mg | SUBCUTANEOUS | Status: DC

## 2018-11-29 NOTE — Progress Notes (Signed)
Patient's HR continues to be sustained in the 130's-140's. Provider paged x 2. No new orders received at this time.

## 2018-11-29 NOTE — Progress Notes (Signed)
PROGRESS NOTE    Derek [name]  HYH:[id number] DOB: 09/02/1975 DOA: 11/20/2018 PCP: Posey Boyer, MD  Brief Narrative: 43 y.o.malewithpast medical history significant for back pain for which patient has been seeing orthopedic team. Patient presented with nausea and anorexia. On presentation to the hospital, patient was found to have serumcreatinine of 10.85,BUNof123,WBC count 14.1,total proteinof8.4,calciumof13.6,albumin of4.4andUA showed100 protein. Work-up done was suggestive of multiple myeloma, with CT scan of the chest with contrast revealing lesions in all imaged bones, expansile and destructive mass lesion in the right third rib, pathologic compression fractures of T12 and T5. Nephrology team and oncology team assisting with patient's management. Patient is currently on IV steroids, Velcade and Cytoxan. Renal function has improved significantly. Serum creatinine is down to 4.66. Patient has just undergone skeletal survey, and the result is pending. Oncology team is directing care.   Assessment & Plan:   Principal Problem:   ARF (acute renal failure) (HCC) Active Problems:   Low back pain   Hypercalcemia   Acute renal failure (ARF) ([place])   AKI (acute kidney injury) ([place])   Multiple myeloma ([place])   #1 multiple myeloma followed by oncology started on chemotherapy Cytoxan Velcade and steroid. Patient received 2 doses of Velcade.  Had bone marrow biopsy 11/28/2018   #2 AKI likely secondary to myeloma improving  #3 T12-L1 compression fracture patient reports having a lot of pain and not being able to walk. Abnormal bone density on CT scan.?Pathologic fracture. Continue pain control with Dilaudid and oxycodone.  #4 hyponatremia resolved patient refuses to take sodium tablets and requested me to stop his sodium tablets as he feels very hot while urinating when he takes sodium tablets. I have stopped as per patient request we will follow-up BMP  daily.  Sodium normalized.  #5 hypercalcemia resolved Calcium 9.2 11/26/2018. Status post pamidronate and calcitonin. 11/22/2018.  #6 right anterior chest wall pain secondary to multiple myeloma CT of the chest 11/24/2018 with diffuse lytic lesions and destructive mass involving the right third rib pathology compression fractures at T12 and T5. Bone survey 11/26/2018 shows osseous destruction of the right third rib compression fractures at T5 L1-L2 and L4  #7 new onset tachycardia secondary to pain resolving.   VTE Prophylaxis: SCD patient refuses Lovenox Code Status:Full code Family Communication:None  Disposition Plan:Pending clinical improvement patient to have bone marrow biopsy 11/28/2018 patient to get third chemo treatment on Friday Consultants:  Nephrology, oncology   Estimated body mass index is 30.17 kg/m as calculated from the following:   Height as of this encounter: 6' (1.829 m).   Weight as of this encounter: 100.9 kg.  Subjective:  Overnight patient became tachycardic this was thought to be due to pain this morning patient denies any pain he just reports that he did not sleep at all due to people checking his vital signs and he wants to sleep he denies chest pain shortness of breath nausea vomiting diarrhea Objective: Vitals:   11/29/18 0708 11/29/18 0731 11/29/18 0918 11/29/18 1105  BP: (!) 136/93 (!) 147/97 (!) 143/89 (!) 143/92  Pulse: (!) 133 (!) 136 (!) 106 (!) 117  Resp: _0 Temp: 100 F (37.8 C) 99.9 F (37.7 C) 98.1 F (36.7 C) 99.9 F (37.7 C)  TempSrc: Oral Oral Oral Oral  SpO2: 92% 97% 95% 96%  Weight:      Height:        Intake/Output Summary (Last 24 hours) at 11/29/2018 1215 Last data filed at 11/29/2018 0710 Gross  per 24 hour  Intake 1227.57 ml  Output 1900 ml  Net -672.43 ml   Filed Weights   11/27/18 0509 11/28/18 0500 11/29/18 0500  Weight: 96 kg 98.2 kg 100.9 kg    Examination:  General exam: Appears calm and  comfortable  Respiratory system: Clear to auscultation. Respiratory effort normal. Cardiovascular system: S1 & S2 heard, RRR. No JVD, murmurs, rubs, gallops or clicks. No pedal edema. Gastrointestinal system: Abdomen is nondistended, soft and nontender. No organomegaly or masses felt. Normal bowel sounds heard.  Tender right upper and right lower chest area Central nervous system: Alert and oriented. No focal neurological deficits. Extremities: Symmetric 5 x 5 power.  Moves all the extremities Skin: No rashes, lesions or ulcers Psychiatry: Judgement and insight appear normal. Mood & affect appropriate.     Data Reviewed: I have personally reviewed following labs and imaging studies  CBC: Recent Labs  Lab 11/24/18 0408 11/25/18 0405 11/26/18 0845 11/28/18 0751 11/29/18 0352  WBC 5.7 5.2 8.7 9.6 7.3  NEUTROABS  --   --  7.3  --   --   HGB 10.2* 9.8* 8.8* 8.9* 9.9*  HCT 30.2* 30.5* 26.9* 27.0* 30.8*  MCV 83.2 85.7 84.1 85.7 86.0  PLT 328 278 276 313 037   Basic Metabolic Panel: Recent Labs  Lab 11/23/18 0404 11/24/18 0408 11/25/18 0405 11/26/18 0844 11/26/18 0845 11/27/18 1300 11/28/18 0402 11/29/18 0352  NA 135 135 136 135  --  138 139 136  K 4.7 4.8 5.0 4.1  --  3.9 4.3 4.6  CL 107 108 109 105  --  98 98 100  CO2 18* 18* 15* 23  --  _0 GLUCOSE 91 97 198* 144*  --  134* 109* 94  BUN 93* 66* 59* 56*  --  59* 60* 48*  CREATININE 8.37* 6.89* 6.06* 4.66*  --  4.14* 3.96* 3.47*  CALCIUM 10.3 10.7* 10.0 9.2  --  9.3 9.0 8.7*  MG  --   --  2.1  --  2.0  --   --   --   PHOS 6.1* 5.2* 4.8*   5.0* 3.6  --   --   --  2.9   GFR: Estimated Creatinine Clearance: 33.4 mL/min (A) (by C-G formula based on SCr of 3.47 mg/dL (H)). Liver Function Tests: Recent Labs  Lab 11/23/18 0404 11/24/18 0408 11/25/18 0405 11/26/18 0844 11/29/18 0352  ALBUMIN 3.5 3.3* 3.2* 3.1* 3.2*   No results for input(s): LIPASE, AMYLASE in the last 168 hours. No results for input(s): AMMONIA  in the last 168 hours. Coagulation Profile: Recent Labs  Lab 11/28/18 0402  INR 1.1   Cardiac Enzymes: No results for input(s): CKTOTAL, CKMB, CKMBINDEX, TROPONINI in the last 168 hours. BNP (last 3 results) No results for input(s): PROBNP in the last 8760 hours. HbA1C: No results for input(s): HGBA1C in the last 72 hours. CBG: Recent Labs  Lab 11/27/18 0717 11/27/18 1212 11/27/18 1852 11/27/18 2149 11/28/18 0809  GLUCAP 98 117* 96 160* 95   Lipid Profile: No results for input(s): CHOL, HDL, LDLCALC, TRIG, CHOLHDL, LDLDIRECT in the last 72 hours. Thyroid Function Tests: No results for input(s): TSH, T4TOTAL, FREET4, T3FREE, THYROIDAB in the last 72 hours. Anemia Panel: No results for input(s): VITAMINB12, FOLATE, FERRITIN, TIBC, IRON, RETICCTPCT in the last 72 hours. Sepsis Labs: No results for input(s): PROCALCITON, LATICACIDVEN in the last 168 hours.  Recent Results (from the past 240 hour(s))  SARS CORONAVIRUS 2 (TAT  6-24 HRS) Nasopharyngeal Nasopharyngeal Swab     Status: None   Collection Time: 11/21/18  8:50 PM   Specimen: Nasopharyngeal Swab  Result Value Ref Range Status   SARS Coronavirus 2 NEGATIVE NEGATIVE Final    Comment: (NOTE) SARS-CoV-2 target nucleic acids are NOT DETECTED. The SARS-CoV-2 RNA is generally detectable in upper and lower respiratory specimens during the acute phase of infection. Negative results do not preclude SARS-CoV-2 infection, do not rule out co-infections with other pathogens, and should not be used as the sole basis for treatment or other patient management decisions. Negative results must be combined with clinical observations, patient history, and epidemiological information. The expected result is Negative. Fact Sheet for Patients: SugarRoll.be Fact Sheet for Healthcare Providers: https://www.[facility].com/ This test is not yet approved or cleared by the [place] FDA and    has been authorized for detection and/or diagnosis of SARS-CoV-2 by FDA under an Emergency Use Authorization (EUA). This EUA will remain  in effect (meaning this test can be used) for the duration of the COVID-19 declaration under Section 56 4(b)(1) of the Act, 21 U.S.C. section 360bbb-3(b)(1), unless the authorization is terminated or revoked sooner. Performed at [facility], [place] [address], [place], [place] [address]   Culture, Urine     Status: Abnormal   Collection Time: 11/24/18  7:22 AM   Specimen: Urine, Clean Catch  Result Value Ref Range Status   Specimen Description   Final    URINE, CLEAN CATCH Performed at [facility], [place] [address], [place], [place] [address]    Special Requests   Final    NONE Performed at [facility], [place] [address] [place], [place] [address]    Culture (A)  Final    60,000 COLONIES/mL MULTIPLE SPECIES PRESENT, SUGGEST RECOLLECTION   Report Status 11/25/2018 FINAL  Final  Culture, blood (routine x 2)     Status: None   Collection Time: 11/24/18  7:57 AM   Specimen: BLOOD LEFT HAND  Result Value Ref Range Status   Specimen Description   Final    BLOOD LEFT HAND Performed at [facility] [address], [place], [place] [address]    Special Requests   Final    BOTTLES DRAWN AEROBIC AND ANAEROBIC Blood Culture adequate volume Performed at [facility] [address], [place], [place] [address]    Culture   Final    NO GROWTH 5 DAYS Performed at [facility], [place] [address] [place], [place] [address]    Report Status 11/29/2018 FINAL  Final  Culture, blood (routine x 2)     Status: None   Collection Time: 11/24/18  7:58 AM   Specimen: BLOOD RIGHT HAND  Result Value Ref Range Status   Specimen Description   Final    BLOOD RIGHT HAND Performed at [facility] [address], [place], [place] [address]    Special Requests    Final    BOTTLES DRAWN AEROBIC AND ANAEROBIC Blood Culture adequate volume Performed at [facility] [address] [place], [place] [address]    Culture   Final    NO GROWTH 5 DAYS Performed at [facility], [place] [address], [place], [place] [address]    Report Status 11/29/2018 FINAL  Final         Radiology Studies: Ct Biopsy  Result Date: 2018-12-07 INDICATION: 43 year old male with multiple myeloma and progressive osseous  lesions. He presents for bone marrow biopsy. EXAM: CT GUIDED BONE MARROW ASPIRATION AND CORE BIOPSY Interventional Radiologist:  Criselda Peaches, MD MEDICATIONS: None. ANESTHESIA/SEDATION: Moderate (conscious) sedation was employed during this procedure. A total of 4 milligrams versed and 150 micrograms fentanyl were administered intravenously. The patient's level of consciousness and vital signs were monitored continuously by radiology nursing throughout the procedure under my direct supervision. Total monitored sedation time: 10 minutes FLUOROSCOPY TIME:  None COMPLICATIONS: None immediate. Estimated blood loss: <25 mL PROCEDURE: Informed written consent was obtained from the patient after a thorough discussion of the procedural risks, benefits and alternatives. All questions were addressed. Maximal Sterile Barrier Technique was utilized including caps, mask, sterile gowns, sterile gloves, sterile drape, hand hygiene and skin antiseptic. A timeout was performed prior to the initiation of the procedure. The patient was positioned prone and non-contrast localization CT was performed of the pelvis to demonstrate the iliac marrow spaces. Maximal barrier sterile technique utilized including caps, mask, sterile gowns, sterile gloves, large sterile drape, hand hygiene, and betadine prep. Under sterile conditions and local anesthesia, an 11 gauge coaxial bone biopsy needle was advanced into the right iliac marrow space. Needle position was confirmed  with CT imaging. Initially, bone marrow aspiration was performed. Next, the 11 gauge outer cannula was utilized to obtain a right iliac bone marrow core biopsy. Needle was removed. Hemostasis was obtained with compression. The patient tolerated the procedure well. Samples were prepared with the cytotechnologist. IMPRESSION: Technically successful CT-guided bone marrow aspiration and core biopsy of the right iliac bone. Electronically Signed   By: Jacqulynn Cadet M.D.   On: 11/28/2018 13:14   Ct Bone Marrow Biopsy & Aspiration  Result Date: 11/28/2018 INDICATION: 43 year old male with multiple myeloma and progressive osseous lesions. He presents for bone marrow biopsy. EXAM: CT GUIDED BONE MARROW ASPIRATION AND CORE BIOPSY Interventional Radiologist:  Criselda Peaches, MD MEDICATIONS: None. ANESTHESIA/SEDATION: Moderate (conscious) sedation was employed during this procedure. A total of 4 milligrams versed and 150 micrograms fentanyl were administered intravenously. The patient's level of consciousness and vital signs were monitored continuously by radiology nursing throughout the procedure under my direct supervision. Total monitored sedation time: 10 minutes FLUOROSCOPY TIME:  None COMPLICATIONS: None immediate. Estimated blood loss: <25 mL PROCEDURE: Informed written consent was obtained from the patient after a thorough discussion of the procedural risks, benefits and alternatives. All questions were addressed. Maximal Sterile Barrier Technique was utilized including caps, mask, sterile gowns, sterile gloves, sterile drape, hand hygiene and skin antiseptic. A timeout was performed prior to the initiation of the procedure. The patient was positioned prone and non-contrast localization CT was performed of the pelvis to demonstrate the iliac marrow spaces. Maximal barrier sterile technique utilized including caps, mask, sterile gowns, sterile gloves, large sterile drape, hand hygiene, and betadine prep.  Under sterile conditions and local anesthesia, an 11 gauge coaxial bone biopsy needle was advanced into the right iliac marrow space. Needle position was confirmed with CT imaging. Initially, bone marrow aspiration was performed. Next, the 11 gauge outer cannula was utilized to obtain a right iliac bone marrow core biopsy. Needle was removed. Hemostasis was obtained with compression. The patient tolerated the procedure well. Samples were prepared with the cytotechnologist. IMPRESSION: Technically successful CT-guided bone marrow aspiration and core biopsy of the right iliac bone. Electronically Signed   By: Jacqulynn Cadet M.D.   On: 11/28/2018 13:14        Scheduled Meds:  enoxaparin (LOVENOX) injection  50  mg Subcutaneous Q24H   lidocaine  1 patch Transdermal Q24H   Continuous Infusions:   LOS: 8 days     Georgette Shell, MD [facility]  If 7PM-7AM, please contact night-coverage [email or web address] Password [email or web address] 11/29/2018, 12:15 PM

## 2018-11-29 NOTE — Progress Notes (Signed)
Patient ID: Derek [name], male   DOB: [birth year], 43 y.o.   MRN: [id number] [facility] Progress Note   Assessment/ Plan:   1. Acute kidney Injury: Likely secondary to multiple myeloma versus hemodynamically mediated renal injury in the setting of hypercalcemia and ongoing NSAIDs.  Renal function continues to improve on daily labs and IV fluids were stopped last night.  He will be set up for renal follow up in 3 weeks after DC and will get weekly labs to follow GFR/electrolytes until then (details will be entered in DC section of chart). 2.  Multiple myeloma: Started on chemotherapy with Decadron, cyclophosphamide and Velcade.  Bone marrow biopsy done yesterday and s/p second dose of Velcade yesterday. 3.  Hypercalcemia: Secondary to multiple myeloma, corrected with pamidronate and isotonic fluids. 4.  Anion gap metabolic acidosis: Secondary to acute kidney injury, corrected with isotonic sodium bicarbonate/oral sodium bicarbonate. 5. Hypertension: consider starting him on low dose carvedilol 3.76m PO BID that will also help control HR.   Renal service will sign off at this time and follow him as an OP. Please call with question and concerns  Subjective:   Reports some generalized pain with associated tachycardia overnight. Slightly better this afternoon.   Objective:   BP (!) 143/92   Pulse (!) 117   Temp 99.9 F (37.7 C) (Oral)   Resp 18   Ht 6' (1.829 m)   Wt 100.9 kg   SpO2 96%   BMI 30.17 kg/m   Intake/Output Summary (Last 24 hours) at 11/29/2018 1337 Last data filed at 11/29/2018 0710 Gross per 24 hour  Intake 1227.57 ml  Output 1900 ml  Net -672.43 ml   Weight change: 2.7 kg  Physical Exam: Gen: Comfortably resting in bed, had PT earlier CVS: Pulse regular tachycardia, normal rate, S1 and S2 normal Resp: Clear to auscultation, no rales/rhonchi Abd: Soft, obese, nontender Ext: No lower extremity edema  Imaging: Ct Biopsy  Result Date:  11/28/2018 INDICATION: 43year old male with multiple myeloma and progressive osseous lesions. He presents for bone marrow biopsy. EXAM: CT GUIDED BONE MARROW ASPIRATION AND CORE BIOPSY Interventional Radiologist:  HCriselda Peaches MD MEDICATIONS: None. ANESTHESIA/SEDATION: Moderate (conscious) sedation was employed during this procedure. A total of 4 milligrams versed and 150 micrograms fentanyl were administered intravenously. The patient's level of consciousness and vital signs were monitored continuously by radiology nursing throughout the procedure under my direct supervision. Total monitored sedation time: 10 minutes FLUOROSCOPY TIME:  None COMPLICATIONS: None immediate. Estimated blood loss: <25 mL PROCEDURE: Informed written consent was obtained from the patient after a thorough discussion of the procedural risks, benefits and alternatives. All questions were addressed. Maximal Sterile Barrier Technique was utilized including caps, mask, sterile gowns, sterile gloves, sterile drape, hand hygiene and skin antiseptic. A timeout was performed prior to the initiation of the procedure. The patient was positioned prone and non-contrast localization CT was performed of the pelvis to demonstrate the iliac marrow spaces. Maximal barrier sterile technique utilized including caps, mask, sterile gowns, sterile gloves, large sterile drape, hand hygiene, and betadine prep. Under sterile conditions and local anesthesia, an 11 gauge coaxial bone biopsy needle was advanced into the right iliac marrow space. Needle position was confirmed with CT imaging. Initially, bone marrow aspiration was performed. Next, the 11 gauge outer cannula was utilized to obtain a right iliac bone marrow core biopsy. Needle was removed. Hemostasis was obtained with compression. The patient tolerated the procedure well. Samples were prepared with the cytotechnologist. IMPRESSION:  Technically successful CT-guided bone marrow aspiration and core  biopsy of the right iliac bone. Electronically Signed   By: Jacqulynn Cadet M.D.   On: 11/28/2018 13:14   Ct Bone Marrow Biopsy & Aspiration  Result Date: 11/28/2018 INDICATION: 43 year old male with multiple myeloma and progressive osseous lesions. He presents for bone marrow biopsy. EXAM: CT GUIDED BONE MARROW ASPIRATION AND CORE BIOPSY Interventional Radiologist:  Criselda Peaches, MD MEDICATIONS: None. ANESTHESIA/SEDATION: Moderate (conscious) sedation was employed during this procedure. A total of 4 milligrams versed and 150 micrograms fentanyl were administered intravenously. The patient's level of consciousness and vital signs were monitored continuously by radiology nursing throughout the procedure under my direct supervision. Total monitored sedation time: 10 minutes FLUOROSCOPY TIME:  None COMPLICATIONS: None immediate. Estimated blood loss: <25 mL PROCEDURE: Informed written consent was obtained from the patient after a thorough discussion of the procedural risks, benefits and alternatives. All questions were addressed. Maximal Sterile Barrier Technique was utilized including caps, mask, sterile gowns, sterile gloves, sterile drape, hand hygiene and skin antiseptic. A timeout was performed prior to the initiation of the procedure. The patient was positioned prone and non-contrast localization CT was performed of the pelvis to demonstrate the iliac marrow spaces. Maximal barrier sterile technique utilized including caps, mask, sterile gowns, sterile gloves, large sterile drape, hand hygiene, and betadine prep. Under sterile conditions and local anesthesia, an 11 gauge coaxial bone biopsy needle was advanced into the right iliac marrow space. Needle position was confirmed with CT imaging. Initially, bone marrow aspiration was performed. Next, the 11 gauge outer cannula was utilized to obtain a right iliac bone marrow core biopsy. Needle was removed. Hemostasis was obtained with compression. The  patient tolerated the procedure well. Samples were prepared with the cytotechnologist. IMPRESSION: Technically successful CT-guided bone marrow aspiration and core biopsy of the right iliac bone. Electronically Signed   By: Jacqulynn Cadet M.D.   On: 11/28/2018 13:14    Labs: BMET Recent Labs  Lab 11/23/18 0404 11/24/18 0408 11/25/18 0405 11/26/18 0844 11/27/18 1300 11/28/18 0402 11/29/18 0352  NA 135 135 136 135 138 139 136  K 4.7 4.8 5.0 4.1 3.9 4.3 4.6  CL 107 108 109 105 98 98 100  CO2 18* 18* 15* _0 GLUCOSE 91 97 198* 144* 134* 109* 94  BUN 93* 66* 59* 56* 59* 60* 48*  CREATININE 8.37* 6.89* 6.06* 4.66* 4.14* 3.96* 3.47*  CALCIUM 10.3 10.7* 10.0 9.2 9.3 9.0 8.7*  PHOS 6.1* 5.2* 4.8*  5.0* 3.6  --   --  2.9   CBC Recent Labs  Lab 11/25/18 0405 11/26/18 0845 11/28/18 0751 11/29/18 0352  WBC 5.2 8.7 9.6 7.3  NEUTROABS  --  7.3  --   --   HGB 9.8* 8.8* 8.9* 9.9*  HCT 30.5* 26.9* 27.0* 30.8*  MCV 85.7 84.1 85.7 86.0  PLT 278 276 313 276    Medications:    . enoxaparin (LOVENOX) injection  50 mg Subcutaneous Q24H  . lidocaine  1 patch Transdermal Q24H   Elmarie Shiley, MD 11/29/2018, 1:37 PM

## 2018-11-29 NOTE — Progress Notes (Signed)
Patient refuses lovenox injections

## 2018-11-29 NOTE — Progress Notes (Signed)
PRN metoprolol given for HR 132.
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
## 2018-11-30 ENCOUNTER — Telehealth: Payer: Self-pay | Admitting: Nurse Practitioner

## 2018-11-30 LAB — CBC
HCT: 32.8 % — ABNORMAL LOW (ref 39.0–52.0)
Hemoglobin: 10.4 g/dL — ABNORMAL LOW (ref 13.0–17.0)
MCH: 27.8 pg (ref 26.0–34.0)
MCHC: 31.7 g/dL (ref 30.0–36.0)
MCV: 87.7 fL (ref 80.0–100.0)
Platelets: 253 10*3/uL (ref 150–400)
RBC: 3.74 MIL/uL — ABNORMAL LOW (ref 4.22–5.81)
RDW: 13.6 % (ref 11.5–15.5)
WBC: 6.9 10*3/uL (ref 4.0–10.5)
nRBC: 0 % (ref 0.0–0.2)

## 2018-11-30 LAB — RENAL FUNCTION PANEL
Albumin: 3.2 g/dL — ABNORMAL LOW (ref 3.5–5.0)
Anion gap: 12 (ref 5–15)
BUN: 38 mg/dL — ABNORMAL HIGH (ref 6–20)
CO2: 23 mmol/L (ref 22–32)
Calcium: 8.2 mg/dL — ABNORMAL LOW (ref 8.9–10.3)
Chloride: 102 mmol/L (ref 98–111)
Creatinine, Ser: 3.16 mg/dL — ABNORMAL HIGH (ref 0.61–1.24)
GFR calc Af Amer: 26 mL/min — ABNORMAL LOW (ref >60)
GFR calc non Af Amer: 23 mL/min — ABNORMAL LOW (ref >60)
Glucose, Bld: 110 mg/dL — ABNORMAL HIGH (ref 70–99)
Phosphorus: 4.3 mg/dL (ref 2.5–4.6)
Potassium: 4.6 mmol/L (ref 3.5–5.1)
Sodium: 137 mmol/L (ref 135–145)

## 2018-11-30 MED ORDER — ALUM & MAG HYDROXIDE-SIMETH 200-200-20 MG/5ML PO SUSP
30.0000 mL | Freq: Once | ORAL | Status: AC
  Administered 2018-11-30: 30 mL via ORAL
  Filled 2018-11-30: qty 30

## 2018-11-30 MED ORDER — FENTANYL 25 MCG/HR TD PT72
1.0000 | MEDICATED_PATCH | TRANSDERMAL | Status: DC
  Filled 2018-11-30: qty 1

## 2018-11-30 MED ORDER — DOCUSATE SODIUM 100 MG PO CAPS
200.0000 mg | ORAL_CAPSULE | Freq: Two times a day (BID) | ORAL | Status: DC
  Administered 2018-11-30 – 2018-12-04 (×4): 200 mg via ORAL
  Filled 2018-11-30 (×7): qty 2

## 2018-11-30 MED ORDER — LIDOCAINE VISCOUS HCL 2 % MT SOLN
15.0000 mL | Freq: Once | OROMUCOSAL | Status: AC
  Administered 2018-11-30: 15 mL via ORAL
  Filled 2018-11-30: qty 15

## 2018-11-30 MED ORDER — HYDROMORPHONE HCL 1 MG/ML IJ SOLN
1.0000 mg | Freq: Four times a day (QID) | INTRAMUSCULAR | Status: DC | PRN
  Administered 2018-11-30 – 2018-12-01 (×3): 1 mg via INTRAVENOUS
  Filled 2018-11-30 (×3): qty 1
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
## 2018-11-30 NOTE — Plan of Care (Signed)
  Problem: Coping: Goal: Level of anxiety will decrease Outcome: Progressing   Problem: Elimination: Goal: Will not experience complications related to bowel motility Outcome: Progressing Goal: Will not experience complications related to urinary retention Outcome: Progressing   Problem: Pain Managment: Goal: General experience of comfort will improve Outcome: Progressing   

## 2018-11-30 NOTE — Evaluation (Signed)
Physical Therapy Evaluation Patient Details Name: Derek [name] MRN: [id number] DOB: [birth year] Today's Date: 11/30/2018   History of Present Illness  Pt is a 43 year old male admitted for back pain and work up suggestive of multiple myeloma. CT of the chest 11/24/2018 with diffuse lytic lesions and destructive mass involving the right third rib pathology compression fractures at T12 and T5. Bone survey 11/26/2018 shows osseous destruction of the right third rib compression fractures at T5 L1-L2 and L4  Clinical Impression  Pt admitted with above diagnosis.  Pt currently with functional limitations due to the deficits listed below (see PT Problem List). Pt will benefit from skilled PT to increase their independence and safety with mobility to allow discharge to the venue listed below.  Pt able to stand pivot from bed to [facility].  Spouse and pt appear to have a good system in place currently.  Pt reports this is the best he has been able to do due to pain and he cannot tolerate ambulating at this time.  Pt reports he has ordered a wedge pillow to assist with bed mobility due to pain from rib fractures.  Pt has RW however could benefit from [facility] upon d/c.  Pt would benefit from further PT if agreeable however pain control may be an issue.     Follow Up Recommendations Home health PT    Equipment Recommendations  3in1 (PT)    Recommendations for Other Services       Precautions / Restrictions Precautions Precautions: Back Precaution Comments: back for comfort, pt reports increased pain with rib fractures/lesions Restrictions Weight Bearing Restrictions: No      Mobility  Bed Mobility Overal bed mobility: Modified Independent             General bed mobility comments: HOB elevated, slow to mobilize due to pain however no physical assist required  Transfers Overall transfer level: Needs assistance Equipment used: None Transfers: Sit to/from Stand;Stand Pivot Transfers Sit to Stand:  Min guard Stand pivot transfers: Min guard       General transfer comment: pt slow but steady, increased time required, pt utilizes bed rail and BSC  Ambulation/Gait             General Gait Details: pt declined, states he is not ready for ambulating yet, aware of need/benefits and politely reports he knows he will not be able to tolerate ambulating (pain)  Stairs            Wheelchair Mobility    Modified Rankin (Stroke Patients Only)       Balance                                             Pertinent Vitals/Pain Pain Assessment: 0-10 Pain Score: 8  Pain Location: ribs, back Pain Descriptors / Indicators: Guarding;Grimacing Pain Intervention(s): Monitored during session;Repositioned;Patient requesting pain meds-RN notified    Home Living Family/patient expects to be discharged to:: Private residence Living Arrangements: Spouse/significant other Available Help at Discharge: Family Type of Home: House Home Access: Stairs to enter Entrance Stairs-Rails: Right Entrance Stairs-Number of Steps: 5 Home Layout: One level Home Equipment: Walker - 2 wheels Additional Comments: plan to obtain wedge pillow, pt states hospital bed will not fit in room    Prior Function Level of Independence: Independent  Hand Dominance        Extremity/Trunk Assessment        Lower Extremity Assessment Lower Extremity Assessment: Overall WFL for tasks assessed    Cervical / Trunk Assessment Cervical / Trunk Assessment: Normal  Communication   Communication: No difficulties  Cognition Arousal/Alertness: Awake/alert Behavior During Therapy: WFL for tasks assessed/performed Overall Cognitive Status: Within Functional Limits for tasks assessed                                        General Comments      Exercises     Assessment/Plan    PT Assessment Patient needs continued PT services  PT Problem List  Decreased mobility;Decreased activity tolerance;Decreased knowledge of use of DME;Pain       PT Treatment Interventions DME instruction;Therapeutic exercise;Balance training;Stair training;Functional mobility training;Therapeutic activities;Patient/family education    PT Goals (Current goals can be found in the Care Plan section)  Acute Rehab PT Goals PT Goal Formulation: With patient Time For Goal Achievement: 12/14/18 Potential to Achieve Goals: Good    Frequency Min 3X/week   Barriers to discharge        Co-evaluation               AM-PAC PT "6 Clicks" Mobility  Outcome Measure Help needed turning from your back to your side while in a flat bed without using bedrails?: A Little Help needed moving from lying on your back to sitting on the side of a flat bed without using bedrails?: A Little Help needed moving to and from a bed to a chair (including a wheelchair)?: A Little Help needed standing up from a chair using your arms (e.g., wheelchair or bedside chair)?: A Little Help needed to walk in hospital room?: A Little Help needed climbing 3-5 steps with a railing? : A Little 6 Click Score: 18    End of Session Equipment Utilized During Treatment: Gait belt Activity Tolerance: Patient limited by pain Patient left: with call bell/phone within reach;with family/visitor present(with spouse) Nurse Communication: Mobility status;Patient requests pain meds PT Visit Diagnosis: Difficulty in walking, not elsewhere classified (R26.2)    Time: 7185-5015 PT Time Calculation (min) (ACUTE ONLY): 16 min   Charges:   PT Evaluation $PT Eval Low Complexity: [place], PT, DPT Acute Rehabilitation Services Office: [phone] Pager: [phone]   Trena Platt 11/30/2018, 12:32 PM

## 2018-11-30 NOTE — Progress Notes (Addendum)
PROGRESS NOTE    Derek [name]  UYQ:[id number] DOB: 06/06/1975 DOA: 11/20/2018 PCP: Posey Boyer, MD   Brief Narrative:43 y.o.malewithpast medical history significant for back pain for which patient has been seeing orthopedic team. Patient presented with nausea and anorexia. On presentation to the hospital, patient was found to have serumcreatinine of 10.85,BUNof123,WBC count 14.1,total proteinof8.4,calciumof13.6,albumin of4.4andUA showed100 protein. Work-up done was suggestive of multiple myeloma, with CT scan of the chest with contrast revealing lesions in all imaged bones, expansile and destructive mass lesion in the right third rib, pathologic compression fractures of T12 and T5. Nephrology team and oncology team assisting with patient's management. Patient is currently on IV steroids, Velcade and Cytoxan. Renal function has improved significantly. Serum creatinine is down to 3.16. Patient has just undergone skeletal survey, and the result is pending. Oncology team is directing care.   Assessment & Plan:   Principal Problem:   ARF (acute renal failure) (HCC) Active Problems:   Low back pain   Hypercalcemia   Acute renal failure (ARF) ([place])   AKI (acute kidney injury) ([place])   Multiple myeloma ([place])   #1 multiple myeloma followed by oncology started on chemotherapy Cytoxan Velcade and steroid.  Had bone marrow biopsy 11/28/2018 patient to get next dose of Velcade 12/01/2018.  He needs to get out of bed ambulate PT consulted again suspect he will need PT after discharge.  #2 AKI  secondary to myelomaimproving  #3 T12-L1 compression fracture patient reports having a lot of pain and not being able to walk. Abnormal bone density on CT scan.?Pathologic fracture. Continue pain control with Dilaudid and oxycodone.  I have decreased the dose of Dilaudid in preparation for discharge home and started on fentanyl patch continue oxycodone.  Patient requiring IV  Dilaudid for pain control.  #4 hyponatremiaresolved  #5 hypercalcemiaresolvedCalcium 9.2 11/26/2018. Status post pamidronate and calcitonin. 11/22/2018.  #6 right anterior chest wall pain secondary to multiple myeloma CT of the chest 11/24/2018 with diffuse lytic lesions and destructive mass involving the right third rib pathology compression fractures at T12 and T5. Bone survey 11/26/2018 shows osseous destruction of the right third rib compression fractures at T5 L1-L2 and L4  #7 new onset tachycardia resolved   VTE Prophylaxis: SCD patient refuses Lovenox Code Status:Full code Family Communication:None  Disposition Plan:Pending clinical improvement patient to have bone marrow biopsy9/10/2018 patient to get third chemo treatment on Friday Consultants:  Nephrology, oncology      Estimated body mass index is 29.18 kg/m as calculated from the following:   Height as of this encounter: 6' (1.829 m).   Weight as of this encounter: 97.6 kg.    Subjective:  Patient hesitant and anxious to go home because of pain patient reports that he lives alone he is worried about increasing pain with walking I did try to explain to him the more he is in bed the more he is going to be weaker. Objective: Vitals:   11/30/18 0143 11/30/18 0504 11/30/18 0737 11/30/18 1022  BP: 121/89 (!) 142/99  (!) 156/101  Pulse: (!) 117 99  (!) 105  Resp: '16 18  20  ' Temp: 98.6 F (37 C) 98.6 F (37 C)  99 F (37.2 C)  TempSrc: Oral Oral  Oral  SpO2: 94% 97%  96%  Weight:   97.6 kg   Height:        Intake/Output Summary (Last 24 hours) at 11/30/2018 1159 Last data filed at 11/30/2018 0843 Gross per 24 hour  Intake 620 ml  Output 4000 ml  Net -3380 ml   Filed Weights   11/28/18 0500 11/29/18 0500 11/30/18 0737  Weight: 98.2 kg 100.9 kg 97.6 kg    Examination:  General exam: Appears calm and comfortable  Respiratory system: Clear to auscultation. Respiratory effort normal.  Right  lower chest upper abdominal tenderness guarding present  cardiovascular system: S1 & S2 heard, RRR. No JVD, murmurs, rubs, gallops or clicks. No pedal edema. Gastrointestinal system: Abdomen is nondistended, soft and nontender. No organomegaly or masses felt. Normal bowel sounds heard. Central nervous system: Alert and oriented. No focal neurological deficits. Extremities: Symmetric 5 x 5 power. Skin: No rashes, lesions or ulcers Psychiatry: Judgement and insight appear normal. Mood & affect appropriate.     Data Reviewed: I have personally reviewed following labs and imaging studies  CBC: Recent Labs  Lab 11/25/18 0405 11/26/18 0845 11/28/18 0751 11/29/18 0352 11/30/18 0437  WBC 5.2 8.7 9.6 7.3 6.9  NEUTROABS  --  7.3  --   --   --   HGB 9.8* 8.8* 8.9* 9.9* 10.4*  HCT 30.5* 26.9* 27.0* 30.8* 32.8*  MCV 85.7 84.1 85.7 86.0 87.7  PLT 278 276 313 276 368   Basic Metabolic Panel: Recent Labs  Lab 11/24/18 0408 11/25/18 0405 11/26/18 0844 11/26/18 0845 11/27/18 1300 11/28/18 0402 11/29/18 0352 11/30/18 0437  NA 135 136 135  --  138 139 136 137  K 4.8 5.0 4.1  --  3.9 4.3 4.6 4.6  CL 108 109 105  --  98 98 100 102  CO2 18* 15* 23  --  '27 29 26 23  ' GLUCOSE 97 198* 144*  --  134* 109* 94 110*  BUN 66* 59* 56*  --  59* 60* 48* 38*  CREATININE 6.89* 6.06* 4.66*  --  4.14* 3.96* 3.47* 3.16*  CALCIUM 10.7* 10.0 9.2  --  9.3 9.0 8.7* 8.2*  MG  --  2.1  --  2.0  --   --   --   --   PHOS 5.2* 4.8*   5.0* 3.6  --   --   --  2.9 4.3   GFR: Estimated Creatinine Clearance: 36.1 mL/min (A) (by C-G formula based on SCr of 3.16 mg/dL (H)). Liver Function Tests: Recent Labs  Lab 11/24/18 0408 11/25/18 0405 11/26/18 0844 11/29/18 0352 11/30/18 0437  ALBUMIN 3.3* 3.2* 3.1* 3.2* 3.2*   No results for input(s): LIPASE, AMYLASE in the last 168 hours. No results for input(s): AMMONIA in the last 168 hours. Coagulation Profile: Recent Labs  Lab 11/28/18 0402  INR 1.1   Cardiac  Enzymes: No results for input(s): CKTOTAL, CKMB, CKMBINDEX, TROPONINI in the last 168 hours. BNP (last 3 results) No results for input(s): PROBNP in the last 8760 hours. HbA1C: No results for input(s): HGBA1C in the last 72 hours. CBG: Recent Labs  Lab 11/27/18 0717 11/27/18 1212 11/27/18 1852 11/27/18 2149 11/28/18 0809  GLUCAP 98 117* 96 160* 95   Lipid Profile: No results for input(s): CHOL, HDL, LDLCALC, TRIG, CHOLHDL, LDLDIRECT in the last 72 hours. Thyroid Function Tests: No results for input(s): TSH, T4TOTAL, FREET4, T3FREE, THYROIDAB in the last 72 hours. Anemia Panel: No results for input(s): VITAMINB12, FOLATE, FERRITIN, TIBC, IRON, RETICCTPCT in the last 72 hours. Sepsis Labs: No results for input(s): PROCALCITON, LATICACIDVEN in the last 168 hours.  Recent Results (from the past 240 hour(s))  SARS CORONAVIRUS 2 (TAT 6-24 HRS) Nasopharyngeal Nasopharyngeal Swab     Status: None  Collection Time: 11/21/18  8:50 PM   Specimen: Nasopharyngeal Swab  Result Value Ref Range Status   SARS Coronavirus 2 NEGATIVE NEGATIVE Final    Comment: (NOTE) SARS-CoV-2 target nucleic acids are NOT DETECTED. The SARS-CoV-2 RNA is generally detectable in upper and lower respiratory specimens during the acute phase of infection. Negative results do not preclude SARS-CoV-2 infection, do not rule out co-infections with other pathogens, and should not be used as the sole basis for treatment or other patient management decisions. Negative results must be combined with clinical observations, patient history, and epidemiological information. The expected result is Negative. Fact Sheet for Patients: SugarRoll.be Fact Sheet for Healthcare Providers: https://www.[employer].com/ This test is not yet approved or cleared by the [place] FDA and  has been authorized for detection and/or diagnosis of SARS-CoV-2 by FDA under an Emergency Use  Authorization (EUA). This EUA will remain  in effect (meaning this test can be used) for the duration of the COVID-19 declaration under Section 56 4(b)(1) of the Act, 21 U.S.C. section 360bbb-3(b)(1), unless the authorization is terminated or revoked sooner. Performed at [facility], [place] [address] [place], [place] [address]   Culture, Urine     Status: Abnormal   Collection Time: 11/24/18  7:22 AM   Specimen: Urine, Clean Catch  Result Value Ref Range Status   Specimen Description   Final    URINE, CLEAN CATCH Performed at [facility], [place] [address] [place], [place] [address]    Special Requests   Final    NONE Performed at [facility], [place] [address], [place], [place] [address]    Culture (A)  Final    60,000 COLONIES/mL MULTIPLE SPECIES PRESENT, SUGGEST RECOLLECTION   Report Status 11/25/2018 FINAL  Final  Culture, blood (routine x 2)     Status: None   Collection Time: 11/24/18  7:57 AM   Specimen: BLOOD LEFT HAND  Result Value Ref Range Status   Specimen Description   Final    BLOOD LEFT HAND Performed at [place] [address] [place], [place] [address]    Special Requests   Final    BOTTLES DRAWN AEROBIC AND ANAEROBIC Blood Culture adequate volume Performed at [place] [address] [place], [place] [address]    Culture   Final    NO GROWTH 5 DAYS Performed at [facility], [place] [address], [place], [place] [address]    Report Status 11/29/2018 FINAL  Final  Culture, blood (routine x 2)     Status: None   Collection Time: 11/24/18  7:58 AM   Specimen: BLOOD RIGHT HAND  Result Value Ref Range Status   Specimen Description   Final    BLOOD RIGHT HAND Performed at [place] [address], [place], [place] [address]    Special Requests   Final    BOTTLES DRAWN AEROBIC AND ANAEROBIC Blood Culture adequate volume Performed at [facility] [address] [place], [place] [address]    Culture   Final    NO GROWTH 5 DAYS Performed at [facility], [place] [address], [place], [place] [address]    Report Status 11/29/2018 FINAL  Final         Radiology Studies: Ct Biopsy  Result Date: 12/07/18 INDICATION: 43 year old male with multiple myeloma and progressive osseous lesions. He presents for bone marrow biopsy. EXAM: CT GUIDED BONE MARROW ASPIRATION AND  CORE BIOPSY Interventional Radiologist:  Criselda Peaches, MD MEDICATIONS: None. ANESTHESIA/SEDATION: Moderate (conscious) sedation was employed during this procedure. A total of 4 milligrams versed and 150 micrograms fentanyl were administered intravenously. The patient's level of consciousness and vital signs were monitored continuously by radiology nursing throughout the procedure under my direct supervision. Total monitored sedation time: 10 minutes FLUOROSCOPY TIME:  None COMPLICATIONS: None immediate. Estimated blood loss: <25 mL PROCEDURE: Informed written consent was obtained from the patient after a thorough discussion of the procedural risks, benefits and alternatives. All questions were addressed. Maximal Sterile Barrier Technique was utilized including caps, mask, sterile gowns, sterile gloves, sterile drape, hand hygiene and skin antiseptic. A timeout was performed prior to the initiation of the procedure. The patient was positioned prone and non-contrast localization CT was performed of the pelvis to demonstrate the iliac marrow spaces. Maximal barrier sterile technique utilized including caps, mask, sterile gowns, sterile gloves, large sterile drape, hand hygiene, and betadine prep. Under sterile conditions and local anesthesia, an 11 gauge coaxial bone biopsy needle was advanced into the right iliac marrow space. Needle position was confirmed with CT imaging. Initially, bone marrow aspiration was performed. Next, the 11 gauge outer cannula  was utilized to obtain a right iliac bone marrow core biopsy. Needle was removed. Hemostasis was obtained with compression. The patient tolerated the procedure well. Samples were prepared with the cytotechnologist. IMPRESSION: Technically successful CT-guided bone marrow aspiration and core biopsy of the right iliac bone. Electronically Signed   By: Jacqulynn Cadet M.D.   On: 11/28/2018 13:14   Ct Bone Marrow Biopsy & Aspiration  Result Date: 11/28/2018 INDICATION: 43 year old male with multiple myeloma and progressive osseous lesions. He presents for bone marrow biopsy. EXAM: CT GUIDED BONE MARROW ASPIRATION AND CORE BIOPSY Interventional Radiologist:  Criselda Peaches, MD MEDICATIONS: None. ANESTHESIA/SEDATION: Moderate (conscious) sedation was employed during this procedure. A total of 4 milligrams versed and 150 micrograms fentanyl were administered intravenously. The patient's level of consciousness and vital signs were monitored continuously by radiology nursing throughout the procedure under my direct supervision. Total monitored sedation time: 10 minutes FLUOROSCOPY TIME:  None COMPLICATIONS: None immediate. Estimated blood loss: <25 mL PROCEDURE: Informed written consent was obtained from the patient after a thorough discussion of the procedural risks, benefits and alternatives. All questions were addressed. Maximal Sterile Barrier Technique was utilized including caps, mask, sterile gowns, sterile gloves, sterile drape, hand hygiene and skin antiseptic. A timeout was performed prior to the initiation of the procedure. The patient was positioned prone and non-contrast localization CT was performed of the pelvis to demonstrate the iliac marrow spaces. Maximal barrier sterile technique utilized including caps, mask, sterile gowns, sterile gloves, large sterile drape, hand hygiene, and betadine prep. Under sterile conditions and local anesthesia, an 11 gauge coaxial bone biopsy needle was advanced into  the right iliac marrow space. Needle position was confirmed with CT imaging. Initially, bone marrow aspiration was performed. Next, the 11 gauge outer cannula was utilized to obtain a right iliac bone marrow core biopsy. Needle was removed. Hemostasis was obtained with compression. The patient tolerated the procedure well. Samples were prepared with the cytotechnologist. IMPRESSION: Technically successful CT-guided bone marrow aspiration and core biopsy of the right iliac bone. Electronically Signed   By: Jacqulynn Cadet M.D.   On: 11/28/2018 13:14        Scheduled Meds:  docusate sodium  200 mg Oral BID   enoxaparin (LOVENOX) injection  50 mg Subcutaneous Q24H  fentaNYL  1 patch Transdermal Q72H   lidocaine  1 patch Transdermal Q24H   Continuous Infusions:   LOS: 9 days     Georgette Shell, MD [facility]   If 7PM-7AM, please contact night-coverage [email or web address] Password [email or web address] 11/30/2018, 11:59 AM

## 2018-11-30 NOTE — Telephone Encounter (Signed)
Scheduled apt per 9/09 sch message - unable to reach pt . Left message with appt date and time .

## 2018-12-01 ENCOUNTER — Telehealth: Payer: Self-pay | Admitting: Pharmacist

## 2018-12-01 DIAGNOSIS — C9000 Multiple myeloma not having achieved remission: Secondary | ICD-10-CM

## 2018-12-01 LAB — GLUCOSE, CAPILLARY
Glucose-Capillary: 97 mg/dL (ref 70–99)
Glucose-Capillary: 142 mg/dL — ABNORMAL HIGH (ref 70–99)
Glucose-Capillary: 145 mg/dL — ABNORMAL HIGH (ref 70–99)
Glucose-Capillary: 211 mg/dL — ABNORMAL HIGH (ref 70–99)

## 2018-12-01 LAB — COMPREHENSIVE METABOLIC PANEL
ALT: 79 U/L — ABNORMAL HIGH (ref 0–44)
AST: 24 U/L (ref 15–41)
Albumin: 3.3 g/dL — ABNORMAL LOW (ref 3.5–5.0)
Alkaline Phosphatase: 225 U/L — ABNORMAL HIGH (ref 38–126)
Anion gap: 8 (ref 5–15)
BUN: 33 mg/dL — ABNORMAL HIGH (ref 6–20)
CO2: 24 mmol/L (ref 22–32)
Calcium: 8.5 mg/dL — ABNORMAL LOW (ref 8.9–10.3)
Chloride: 103 mmol/L (ref 98–111)
Creatinine, Ser: 3.09 mg/dL — ABNORMAL HIGH (ref 0.61–1.24)
GFR calc Af Amer: 27 mL/min — ABNORMAL LOW (ref >60)
GFR calc non Af Amer: 23 mL/min — ABNORMAL LOW (ref >60)
Glucose, Bld: 104 mg/dL — ABNORMAL HIGH (ref 70–99)
Potassium: 4.8 mmol/L (ref 3.5–5.1)
Sodium: 135 mmol/L (ref 135–145)
Total Bilirubin: 0.5 mg/dL (ref 0.3–1.2)
Total Protein: 6.5 g/dL (ref 6.5–8.1)

## 2018-12-01 LAB — CBC WITH DIFFERENTIAL/PLATELET
Abs Immature Granulocytes: 0.12 10*3/uL — ABNORMAL HIGH (ref 0.00–0.07)
Basophils Absolute: 0 10*3/uL (ref 0.0–0.1)
Basophils Relative: 0 %
Eosinophils Absolute: 0.2 10*3/uL (ref 0.0–0.5)
Eosinophils Relative: 3 %
HCT: 32.7 % — ABNORMAL LOW (ref 39.0–52.0)
Hemoglobin: 10.5 g/dL — ABNORMAL LOW (ref 13.0–17.0)
Immature Granulocytes: 2 %
Lymphocytes Relative: 25 %
Lymphs Abs: 1.5 10*3/uL (ref 0.7–4.0)
MCH: 27.6 pg (ref 26.0–34.0)
MCHC: 32.1 g/dL (ref 30.0–36.0)
MCV: 86.1 fL (ref 80.0–100.0)
Monocytes Absolute: 0.3 10*3/uL (ref 0.1–1.0)
Monocytes Relative: 5 %
Neutro Abs: 3.9 10*3/uL (ref 1.7–7.7)
Neutrophils Relative %: 65 %
Platelets: 255 10*3/uL (ref 150–400)
RBC: 3.8 MIL/uL — ABNORMAL LOW (ref 4.22–5.81)
RDW: 13.3 % (ref 11.5–15.5)
WBC: 6 10*3/uL (ref 4.0–10.5)
nRBC: 0 % (ref 0.0–0.2)

## 2018-12-01 LAB — PTH-RELATED PEPTIDE: PTH-related peptide: <2 pmol/L

## 2018-12-01 LAB — PHOSPHORUS: Phosphorus: 3.3 mg/dL (ref 2.5–4.6)

## 2018-12-01 MED ORDER — ONDANSETRON HCL 4 MG PO TABS
4.0000 mg | ORAL_TABLET | Freq: Once | ORAL | Status: DC
  Filled 2018-12-01: qty 1

## 2018-12-01 MED ORDER — FENTANYL 25 MCG/HR TD PT72
1.0000 | MEDICATED_PATCH | TRANSDERMAL | Status: DC
  Administered 2018-12-01 – 2018-12-04 (×2): 1 via TRANSDERMAL
  Filled 2018-12-01: qty 1

## 2018-12-01 MED ORDER — CYCLOPHOSPHAMIDE 50 MG PO CAPS
300.0000 mg | ORAL_CAPSULE | Freq: Once | ORAL | Status: AC
  Administered 2018-12-01: 300 mg via ORAL
  Filled 2018-12-01: qty 6

## 2018-12-01 MED ORDER — ONDANSETRON HCL 4 MG/2ML IJ SOLN
4.0000 mg | INTRAMUSCULAR | Status: DC | PRN
  Administered 2018-12-01: 4 mg via INTRAVENOUS
  Filled 2018-12-01 (×2): qty 2

## 2018-12-01 MED ORDER — DEXAMETHASONE 6 MG PO TABS
40.0000 mg | ORAL_TABLET | Freq: Once | ORAL | Status: AC
  Administered 2018-12-01: 40 mg via ORAL
  Filled 2018-12-01: qty 1

## 2018-12-01 MED ORDER — HYDROMORPHONE HCL 1 MG/ML IJ SOLN
1.0000 mg | Freq: Three times a day (TID) | INTRAMUSCULAR | Status: DC | PRN
  Administered 2018-12-01 – 2018-12-03 (×5): 1 mg via INTRAVENOUS
  Filled 2018-12-01 (×5): qty 1

## 2018-12-01 MED ORDER — CYCLOPHOSPHAMIDE 50 MG PO CAPS: 300.0000 mg | ORAL_CAPSULE | Freq: Every day | ORAL | 0 refills | Status: DC

## 2018-12-01 MED ORDER — BORTEZOMIB CHEMO SQ INJECTION 3.5 MG (2.5MG/ML)
1.3000 mg/m2 | Freq: Once | INTRAMUSCULAR | Status: AC
  Administered 2018-12-01: 2.75 mg via SUBCUTANEOUS
  Filled 2018-12-01: qty 1.1

## 2018-12-01 NOTE — Plan of Care (Signed)
  Problem: Clinical Measurements: Goal: Ability to maintain clinical measurements within normal limits will improve Outcome: Progressing Goal: Will remain free from infection Outcome: Progressing Goal: Cardiovascular complication will be avoided Outcome: Progressing   

## 2018-12-01 NOTE — Telephone Encounter (Signed)
Oral Chemotherapy Pharmacist Encounter   I spoke with patient's significant other, Deidre, for overview of: cyclophosphamide (cyclophosphamide) for the treatment of multiple myeloma not having achieved remission in conjunction with bortezomib and dexamethasone, planned duration 4-6 cycles then reassssment.  Counseled on administration, dosing, side effects, monitoring, drug-food interactions, safe handling, storage, and disposal.  Patient will take cyclophosphamide 81m capsules, 6 capsules (3019m by mouth once weekly, to be given on Fridays.  Patient will take cyclophosphamide without regard to food, however, administration with food may decrease GI upset. Patient instructed to take cyclophosphamide early in the day and to maintain adequate hydration to prevent bladder toxicity.  Cyclophosphamide was started on 11/24/2018 during current admisssion  Adverse effects include but are not limited to: decreased blood counts, nausea, vomiting, diarrhea, hair loss, and hemorrhagic cystitis.    Reviewed importance of keeping a medication schedule and plan for any missed doses.  Medication reconciliation performed and medication/allergy list updated.  Benefits investigation is pending. Oral oncology patient advocate will follow-up with patient and [name] the week of 9/14 fir medication acquisition.  [name] does not think patient will be discharged this weekend in order [facility]. She thinks it is possible patient will still be admitted next Friday, 12/08/18, which is when his next dose of cyclophosphamide is due. We will continue to follow.  All questions answered.  [name] voiced understanding and appreciation.   They know to call the office with questions or concerns.  JeJohny DrillingPharmD, BCPS, BCOP  12/01/2018 3:15 PM Oral Oncology Clinic [phone]

## 2018-12-01 NOTE — Progress Notes (Signed)
Ok to proceed with Scr 3.09. Patient is due for Velcade SQ and Cytoxan PO. Will f/u with [facility] px with outpatient visit.  Hardie Pulley, PharmD, BCPS, BCOP

## 2018-12-01 NOTE — Progress Notes (Signed)
PROGRESS NOTE    Derek [name]  JWJ:[id number] DOB: 13-Feb-1976 DOA: 11/20/2018 PCP: Posey Boyer, MD    Brief Narrative: 43 y.o.malewithpast medical history significant for back pain for which patient has been seeing orthopedic team. Patient presented with nausea and anorexia. On presentation to the hospital, patient was found to have serumcreatinine of 10.85,BUNof123,WBC count 14.1,total proteinof8.4,calciumof13.6,albumin of4.4andUA showed100 protein. Work-up done was suggestive of multiple myeloma, with CT scan of the chest with contrast revealing lesions in all imaged bones, expansile and destructive mass lesion in the right third rib, pathologic compression fractures of T12 and T5. Nephrology team and oncology team assisting with patient's management. Patient is currently on IV steroids, Velcade and Cytoxan. Renal function has improved significantly. Serum creatinine is down to 3.16. Patient has just undergone skeletal survey, and the result is pending. Oncology team is directing care.  Assessment & Plan:   Principal Problem:   ARF (acute renal failure) (HCC) Active Problems:   Low back pain   Hypercalcemia   Acute renal failure (ARF) ([place])   AKI (acute kidney injury) ([place])   Multiple myeloma ([place])   #1 multiple myeloma followed by oncology started on chemotherapy Cytoxan Velcade and steroid.  He needs to get out of bed ambulate PT consulted   #2 AKI  secondary to myelomaimproving readmitted 3.09 on 12/01/2018  #3 T12-L1 compression fracture patient reports having a lot of pain and not being able to walk. Abnormal bone density on CT scan.?Pathologic fracture. Continue pain control with Dilaudid and oxycodone.  I have decreased the dose of Dilaudid in preparation for discharge home and started on fentanyl patch continue oxycodone.  Patient requiring IV Dilaudid for pain control.  #4 hyponatremiaresolved  #5 hypercalcemiaresolvedCalcium  9.2 11/26/2018. Status post pamidronate and calcitonin. 11/22/2018.  #6 right anterior chest wall pain secondary to multiple myeloma CT of the chest 11/24/2018 with diffuse lytic lesions and destructive mass involving the right third rib pathology compression fractures at T12 and T5. Bone survey 11/26/2018 shows osseous destruction of the right third rib compression fractures at T5 L1-L2 and L4  #7 new onset tachycardia resolved   VTE Prophylaxis: SCDpatient refuses Lovenox Code Status:Full code Family Communication:None  Disposition Plan:Patient to get next chemo today hopefully planning discharge tomorrow encourage patient to get out of bed PT consulted Consultants nephrology and oncology    Estimated body mass index is 29.18 kg/m as calculated from the following:   Height as of this encounter: 6' (1.829 m).   Weight as of this encounter: 97.6 kg.   Subjective:  He is resting in bed he is in no distress discussed with him regarding going home tomorrow he is agreeable to it pain seems to be under control with Dilaudid and oxycodone Objective: Vitals:   11/30/18 2024 12/01/18 0644 12/01/18 1007 12/01/18 1010  BP: (!) 146/100 (!) 136/101 (!) 135/98   Pulse: 96 98 (!) 107   Resp: 20  16   Temp: 97.9 F (36.6 C) 98.5 F (36.9 C)  99.4 F (37.4 C)  TempSrc: Oral Oral  Oral  SpO2: 98% 97% 99%   Weight:      Height:        Intake/Output Summary (Last 24 hours) at 12/01/2018 1227 Last data filed at 12/01/2018 1000 Gross per 24 hour  Intake 240 ml  Output 4350 ml  Net -4110 ml   Filed Weights   11/28/18 0500 11/29/18 0500 11/30/18 0737  Weight: 98.2 kg 100.9 kg 97.6 kg    Examination:  General exam: Appears calm and comfortable  Respiratory system: Clear to auscultation. Respiratory effort normal.  Right anterior chest tenderness Cardiovascular system: S1 & S2 heard, RRR. No JVD, murmurs, rubs, gallops or clicks. No pedal edema. Gastrointestinal system: Abdomen  is nondistended, soft and nontender. No organomegaly or masses felt. Normal bowel sounds heard. Central nervous system: Alert and oriented. No focal neurological deficits. Extremities: Symmetric 5 x 5 power. Skin: No rashes, lesions or ulcers Psychiatry: Judgement and insight appear normal. Mood & affect appropriate.     Data Reviewed: I have personally reviewed following labs and imaging studies  CBC: Recent Labs  Lab 11/26/18 0845 11/28/18 0751 11/29/18 0352 11/30/18 0437 12/01/18 0422  WBC 8.7 9.6 7.3 6.9 6.0  NEUTROABS 7.3  --   --   --  3.9  HGB 8.8* 8.9* 9.9* 10.4* 10.5*  HCT 26.9* 27.0* 30.8* 32.8* 32.7*  MCV 84.1 85.7 86.0 87.7 86.1  PLT 276 313 276 253 768   Basic Metabolic Panel: Recent Labs  Lab 11/25/18 0405 11/26/18 0844 11/26/18 0845 11/27/18 1300 11/28/18 0402 11/29/18 0352 11/30/18 0437 12/01/18 0422  NA 136 135  --  138 139 136 137 135  K 5.0 4.1  --  3.9 4.3 4.6 4.6 4.8  CL 109 105  --  98 98 100 102 103  CO2 15* 23  --  '27 29 26 23 24  ' GLUCOSE 198* 144*  --  134* 109* 94 110* 104*  BUN 59* 56*  --  59* 60* 48* 38* 33*  CREATININE 6.06* 4.66*  --  4.14* 3.96* 3.47* 3.16* 3.09*  CALCIUM 10.0 9.2  --  9.3 9.0 8.7* 8.2* 8.5*  MG 2.1  --  2.0  --   --   --   --   --   PHOS 4.8*  5.0* 3.6  --   --   --  2.9 4.3 3.3   GFR: Estimated Creatinine Clearance: 36.9 mL/min (A) (by C-G formula based on SCr of 3.09 mg/dL (H)). Liver Function Tests: Recent Labs  Lab 11/25/18 0405 11/26/18 0844 11/29/18 0352 11/30/18 0437 12/01/18 0422  AST  --   --   --   --  24  ALT  --   --   --   --  79*  ALKPHOS  --   --   --   --  225*  BILITOT  --   --   --   --  0.5  PROT  --   --   --   --  6.5  ALBUMIN 3.2* 3.1* 3.2* 3.2* 3.3*   No results for input(s): LIPASE, AMYLASE in the last 168 hours. No results for input(s): AMMONIA in the last 168 hours. Coagulation Profile: Recent Labs  Lab 11/28/18 0402  INR 1.1   Cardiac Enzymes: No results for input(s):  CKTOTAL, CKMB, CKMBINDEX, TROPONINI in the last 168 hours. BNP (last 3 results) No results for input(s): PROBNP in the last 8760 hours. HbA1C: No results for input(s): HGBA1C in the last 72 hours. CBG: Recent Labs  Lab 11/27/18 1852 11/27/18 2149 11/28/18 0809 12/01/18 0814 12/01/18 1114  GLUCAP 96 160* 95 97 142*   Lipid Profile: No results for input(s): CHOL, HDL, LDLCALC, TRIG, CHOLHDL, LDLDIRECT in the last 72 hours. Thyroid Function Tests: No results for input(s): TSH, T4TOTAL, FREET4, T3FREE, THYROIDAB in the last 72 hours. Anemia Panel: No results for input(s): VITAMINB12, FOLATE, FERRITIN, TIBC, IRON, RETICCTPCT in the last 72 hours. Sepsis Labs: No  results for input(s): PROCALCITON, LATICACIDVEN in the last 168 hours.  Recent Results (from the past 240 hour(s))  SARS CORONAVIRUS 2 (TAT 6-24 HRS) Nasopharyngeal Nasopharyngeal Swab     Status: None   Collection Time: 11/21/18  8:50 PM   Specimen: Nasopharyngeal Swab  Result Value Ref Range Status   SARS Coronavirus 2 NEGATIVE NEGATIVE Final    Comment: (NOTE) SARS-CoV-2 target nucleic acids are NOT DETECTED. The SARS-CoV-2 RNA is generally detectable in upper and lower respiratory specimens during the acute phase of infection. Negative results do not preclude SARS-CoV-2 infection, do not rule out co-infections with other pathogens, and should not be used as the sole basis for treatment or other patient management decisions. Negative results must be combined with clinical observations, patient history, and epidemiological information. The expected result is Negative. Fact Sheet for Patients: SugarRoll.be Fact Sheet for Healthcare Providers: https://www.woods-mathews.com/ This test is not yet approved or cleared by the [place] FDA and  has been authorized for detection and/or diagnosis of SARS-CoV-2 by FDA under an Emergency Use Authorization (EUA). This EUA will remain   in effect (meaning this test can be used) for the duration of the COVID-19 declaration under Section 56 4(b)(1) of the Act, 21 U.S.C. section 360bbb-3(b)(1), unless the authorization is terminated or revoked sooner. Performed at [facility], [place] [address], [place], [place] [address]   Culture, Urine     Status: Abnormal   Collection Time: 11/24/18  7:22 AM   Specimen: Urine, Clean Catch  Result Value Ref Range Status   Specimen Description   Final    URINE, CLEAN CATCH Performed at [facility], [place] [address] [place], [place] [address]    Special Requests   Final    NONE Performed at [facility], [place] [address], [place], [place] [address]    Culture (A)  Final    60,000 COLONIES/mL MULTIPLE SPECIES PRESENT, SUGGEST RECOLLECTION   Report Status 11/25/2018 FINAL  Final  Culture, blood (routine x 2)     Status: None   Collection Time: 11/24/18  7:57 AM   Specimen: BLOOD LEFT HAND  Result Value Ref Range Status   Specimen Description   Final    BLOOD LEFT HAND Performed at [place] [address], [place], [place] [address]    Special Requests   Final    BOTTLES DRAWN AEROBIC AND ANAEROBIC Blood Culture adequate volume Performed at [place] [address], [place], [place] [address]    Culture   Final    NO GROWTH 5 DAYS Performed at [facility], [place] [address] [place], [place] [address]    Report Status 11/29/2018 FINAL  Final  Culture, blood (routine x 2)     Status: None   Collection Time: 11/24/18  7:58 AM   Specimen: BLOOD RIGHT HAND  Result Value Ref Range Status   Specimen Description   Final    BLOOD RIGHT HAND Performed at [place] [address], [place], [place] [address]    Special Requests   Final    BOTTLES DRAWN AEROBIC AND ANAEROBIC Blood Culture adequate volume Performed at [place] [address], [place], [place] [address]    Culture   Final    NO GROWTH 5 DAYS Performed at [facility], [place] [address], [place],  [address]    Report Status 11/29/2018 FINAL  Final  Radiology Studies: No results found.      Scheduled Meds: . cyclophosphamide  300 mg Oral Once  . dexamethasone  40 mg Oral Once  . docusate sodium  200 mg Oral BID  . enoxaparin (LOVENOX) injection  50 mg Subcutaneous Q24H  . fentaNYL  1 patch Transdermal Q72H  . lidocaine  1 patch Transdermal Q24H  . ondansetron  4 mg Oral Once   Continuous Infusions:   LOS: 10 days     Georgette Shell, MD [facility]  If 7PM-7AM, please contact night-coverage [email or web address] Password [facility] 12/01/2018, 12:27 PM

## 2018-12-01 NOTE — Telephone Encounter (Signed)
Oral Oncology Pharmacist Encounter  Discussed case with MD. Plan for 1 more dose of oral cyclophosphamide with this cycle to be administered on 12/08/18. A prescription for cyclophosphamide 50 mg capsules, take 6 capsules (300 mg) by mouth once weekly, taken early in the day, quantity #6, refills=0, has been e-scribed to the [facility].  Johny Drilling, PharmD, BCPS, BCOP  12/01/2018 3:08 PM Oral Oncology Clinic [phone]

## 2018-12-01 NOTE — Progress Notes (Signed)
Physical Therapy Treatment Patient Details Name: Derek [name] MRN: [id number] DOB: 09-26-1975 Today's Date: 12/01/2018    History of Present Illness Pt is a 43 year old male admitted for back pain and work up suggestive of multiple myeloma. CT of the chest 11/24/2018 with diffuse lytic lesions and destructive mass involving the right third rib pathology compression fractures at T12 and T5. Bone survey 11/26/2018 shows osseous destruction of the right third rib compression fractures at T5 L1-L2 and L4    PT Comments    Pt was able to ambulate around the bed with RW and close MIN/guard with slow, deliberate gait pattern.  Discussed home set-up with pt and significant other. Con't to recommend HHPT.   Follow Up Recommendations  Home health PT     Equipment Recommendations  3in1 (PT)    Recommendations for Other Services       Precautions / Restrictions Precautions Precautions: Back Precaution Comments: back for comfort, pt reports increased pain with rib fractures/lesions Restrictions Weight Bearing Restrictions: No    Mobility  Bed Mobility Overal bed mobility: Modified Independent             General bed mobility comments: HOB elevated with use of rail, slow to mobilize due to pain however no physical assist required  Transfers Overall transfer level: Needs assistance Equipment used: Rolling walker (2 wheeled) Transfers: Sit to/from Stand Sit to Stand: Min guard         General transfer comment: pt pulling on RW rather than pushing on bed due to pain in thoracic region  Ambulation/Gait Ambulation/Gait assistance: Min guard Gait Distance (Feet): 12 Feet Assistive device: Rolling walker (2 wheeled) Gait Pattern/deviations: Decreased step length - right;Decreased step length - left;Shuffle     General Gait Details: Pt with slow and deliberate gait with close MIN/guard, but does not want physical assistance   Stairs             Wheelchair Mobility     Modified Rankin (Stroke Patients Only)       Balance                                            Cognition Arousal/Alertness: Awake/alert Behavior During Therapy: WFL for tasks assessed/performed Overall Cognitive Status: Within Functional Limits for tasks assessed                                        Exercises      General Comments General comments (skin integrity, edema, etc.): Instructed in use of bracing due to pt avoiding coughing, laughing, etc, but he states that does not help.  Discussed use of bed rail to go on his bed at home to use in conjuction with his wedge he is getting (no room for hospital bed)      Pertinent Vitals/Pain Pain Assessment: 0-10 Pain Score: 7  Pain Location: ribs, back Pain Descriptors / Indicators: Guarding;Grimacing Pain Intervention(s): Limited activity within patient's tolerance;Monitored during session;Premedicated before session;Repositioned    Home Living                      Prior Function            PT Goals (current goals can now be found in the care plan section) Acute  Rehab PT Goals Patient Stated Goal: walk PT Goal Formulation: With patient Time For Goal Achievement: 12/14/18 Potential to Achieve Goals: Good Progress towards PT goals: Progressing toward goals    Frequency    Min 3X/week      PT Plan Current plan remains appropriate    Co-evaluation              AM-PAC PT "6 Clicks" Mobility   Outcome Measure  Help needed turning from your back to your side while in a flat bed without using bedrails?: A Little Help needed moving from lying on your back to sitting on the side of a flat bed without using bedrails?: A Little Help needed moving to and from a bed to a chair (including a wheelchair)?: A Little Help needed standing up from a chair using your arms (e.g., wheelchair or bedside chair)?: A Little Help needed to walk in hospital room?: A Little Help  needed climbing 3-5 steps with a railing? : A Little 6 Click Score: 18    End of Session   Activity Tolerance: Patient limited by pain Patient left: in chair;with call bell/phone within reach;with family/visitor present Nurse Communication: Mobility status PT Visit Diagnosis: Difficulty in walking, not elsewhere classified (R26.2)     Time: 3382-5053(ZJ charge for time RN in room) PT Time Calculation (min) (ACUTE ONLY): 39 min  Charges:  $Gait Training: 8-22 mins $Therapeutic Activity: 8-22 mins                     Caid Radin L. Tamala Julian, [place] Pager [phone] 12/01/2018    Galen Manila 12/01/2018, 12:19 PM
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
## 2018-12-01 NOTE — Telephone Encounter (Signed)
Oral Oncology Pharmacist Encounter  Received notification of new referral for Cytoxan (cyclophosphamide) for the treatment of multiple myeloma not having achieved remission in conjunction with bortezomib and dexamethasone, planned duration 4-6 cycles then reassssment.  Patient recently diagnosed during hospital admission. He has been started on his combination therapy while admitted. He remains admitted.  Bortezomib is being administered at 1.3 mg/m2 SQ injection on days 1, 4, 8, and 11 of each 21 day cycle and was started on 11/24/18 Cyclophosphamide is being given at reduced dose of ~134 mg/m2 (300 mg) by mouth once weekly and was started on 11/25/2018 Dexamethasone is being administered at 40 mg by mouth once weekly, started 11/27/18  Labs from 12/01/2018 assessed, OK for continued treatment. SCr has improved to 3.09, est CrCl ~ 42 mL/min No dose reduction needed in renal dysfunction  Current medication list in Epic reviewed, no DDIs with cyclophosphamide identified.  On outpatient prescription for cyclophosphamide has not yet been written. Patient will also need outpatient prescriptions for acyclovir for VZV prophylaxis and pulse dexamethasone, unless it will be administered in the clinic with patient's bortezomib.  Oral Oncology Clinic will continue to follow for insurance authorization, copayment issues, and initial counseling.  Johny Drilling, PharmD, BCPS, BCOP  12/01/2018 11:03 AM Oral Oncology Clinic [phone]

## 2018-12-02 LAB — BASIC METABOLIC PANEL
Anion gap: 11 (ref 5–15)
Anion gap: 12 (ref 5–15)
BUN: 38 mg/dL — ABNORMAL HIGH (ref 6–20)
BUN: 35 mg/dL — ABNORMAL HIGH (ref 6–20)
CO2: 21 mmol/L — ABNORMAL LOW (ref 22–32)
CO2: 20 mmol/L — ABNORMAL LOW (ref 22–32)
Calcium: 8.9 mg/dL (ref 8.9–10.3)
Calcium: 9.2 mg/dL (ref 8.9–10.3)
Chloride: 104 mmol/L (ref 98–111)
Chloride: 105 mmol/L (ref 98–111)
Creatinine, Ser: 2.77 mg/dL — ABNORMAL HIGH (ref 0.61–1.24)
Creatinine, Ser: 2.61 mg/dL — ABNORMAL HIGH (ref 0.61–1.24)
GFR calc Af Amer: 31 mL/min — ABNORMAL LOW (ref >60)
GFR calc Af Amer: 33 mL/min — ABNORMAL LOW (ref >60)
GFR calc non Af Amer: 27 mL/min — ABNORMAL LOW (ref >60)
GFR calc non Af Amer: 29 mL/min — ABNORMAL LOW (ref >60)
Glucose, Bld: 112 mg/dL — ABNORMAL HIGH (ref 70–99)
Glucose, Bld: 88 mg/dL (ref 70–99)
Potassium: 5.5 mmol/L — ABNORMAL HIGH (ref 3.5–5.1)
Potassium: 4.6 mmol/L (ref 3.5–5.1)
Sodium: 136 mmol/L (ref 135–145)
Sodium: 137 mmol/L (ref 135–145)

## 2018-12-02 LAB — CBC
HCT: 30.8 % — ABNORMAL LOW (ref 39.0–52.0)
Hemoglobin: 10.2 g/dL — ABNORMAL LOW (ref 13.0–17.0)
MCH: 28.2 pg (ref 26.0–34.0)
MCHC: 33.1 g/dL (ref 30.0–36.0)
MCV: 85.1 fL (ref 80.0–100.0)
Platelets: 230 10*3/uL (ref 150–400)
RBC: 3.62 MIL/uL — ABNORMAL LOW (ref 4.22–5.81)
RDW: 13.3 % (ref 11.5–15.5)
WBC: 10.4 10*3/uL (ref 4.0–10.5)
nRBC: 0 % (ref 0.0–0.2)

## 2018-12-02 LAB — GLUCOSE, CAPILLARY
Glucose-Capillary: 108 mg/dL — ABNORMAL HIGH (ref 70–99)
Glucose-Capillary: 131 mg/dL — ABNORMAL HIGH (ref 70–99)
Glucose-Capillary: 111 mg/dL — ABNORMAL HIGH (ref 70–99)
Glucose-Capillary: 186 mg/dL — ABNORMAL HIGH (ref 70–99)

## 2018-12-02 MED ORDER — FENTANYL 25 MCG/HR TD PT72: 1.0000 | MEDICATED_PATCH | TRANSDERMAL | 0 refills | Status: DC

## 2018-12-02 MED ORDER — SODIUM POLYSTYRENE SULFONATE 15 GM/60ML PO SUSP
30.0000 g | Freq: Once | ORAL | Status: AC
  Administered 2018-12-02: 30 g via ORAL
  Filled 2018-12-02: qty 120

## 2018-12-02 MED ORDER — OXYCODONE HCL 5 MG PO TABS: 5.0000 mg | ORAL_TABLET | ORAL | 0 refills | Status: DC | PRN

## 2018-12-02 MED ORDER — INSULIN ASPART 100 UNIT/ML IV SOLN
10.0000 [IU] | Freq: Once | INTRAVENOUS | Status: AC
  Administered 2018-12-02: 10 [IU] via INTRAVENOUS

## 2018-12-02 MED ORDER — ALPRAZOLAM 0.25 MG PO TABS: 0.2500 mg | ORAL_TABLET | Freq: Two times a day (BID) | ORAL | 0 refills | Status: DC | PRN

## 2018-12-02 MED ORDER — DEXTROSE 50 % IV SOLN
INTRAVENOUS | Status: AC
  Administered 2018-12-02: 50 mL via INTRAVENOUS
  Filled 2018-12-02: qty 50

## 2018-12-02 MED ORDER — DEXTROSE 50 % IV SOLN
1.0000 | Freq: Once | INTRAVENOUS | Status: AC
  Administered 2018-12-02: 12:00:00 50 mL via INTRAVENOUS

## 2018-12-02 NOTE — Progress Notes (Signed)
Physical Therapy Treatment Patient Details Name: Derek [name] MRN: [id number] DOB: 1975-06-09 Today's Date: 12/02/2018    History of Present Illness Pt is a 43 year old male admitted for back pain and work up suggestive of multiple myeloma. CT of the chest 11/24/2018 with diffuse lytic lesions and destructive mass involving the right third rib pathology compression fractures at T12 and T5. Bone survey 11/26/2018 shows osseous destruction of the right third rib compression fractures at T5 L1-L2 and L4    PT Comments    Long session with pt and wife. MD placed order for imminent eval due to wife/pt stating pt could not move arms/legs/walk enough to go home. Long discussion with pt and wife who seem to have differing perspectives on pt's current capabilities. Wife is fearful of having to care for pt herself. Pt is fairly confident in his ability to mobilize. He declined to practice bed mobility (flat bed), ambulation, and stair negotiation this session. He stated he feels he can do all of those things. He is concerned about his core strength and back "locking up" when mobilizing. Attempted to explain that the spasms and sharp pain are a result of the multiple fractures he has along his spine and ribs. He will very likely continue to have those when mobilizing. Also explained that he will continue to have some risk of falling due to his current condition/state. He was really only agreeable to getting to bsc while therapist was present. Requested wife continue to encourage him to mobilize with nursing and therapy as tolerated. BOTH pt and wife decline placement. Will continue to follow and work with pt as he will allow/can tolerate.     Follow Up Recommendations  Home health PT;Supervision/Assistance - 24 hour     Equipment Recommendations  3in1 (PT)(pt prefers elongated bsc). hospital bed if pt/wife decide they have room for it. Also recommended she consider getting attachable bedrail and wedge from  medical supply store if they decide against hospital bed   Recommendations for Other Services       Precautions / Restrictions Precautions Precaution Comments: back for comfort, pt reports increased pain with rib fractures/lesions Restrictions Weight Bearing Restrictions: No    Mobility  Bed Mobility Overal bed mobility: Modified Independent             General bed mobility comments: HOB elevated with use of rail, slow to mobilize due to pain however no physical assist required  Transfers Overall transfer level: Needs assistance   Transfers: Squat Pivot Transfers     Squat pivot transfers: Min guard     General transfer comment: pt pulling on bsc armrests  rather than pushing on bed due to pain in thoracic region. increased time. pt declined RW use.  Ambulation/Gait             General Gait Details: Nt-pt declined. He only wanted to get to bsc   Stairs             Wheelchair Mobility    Modified Rankin (Stroke Patients Only)       Balance                                            Cognition Arousal/Alertness: Awake/alert Behavior During Therapy: WFL for tasks assessed/performed Overall Cognitive Status: Within Functional Limits for tasks assessed  Exercises      General Comments General comments (skin integrity, edema, etc.): had another discussion, with pt's wife, about hospital bed vs attaching a bedrail to his bed.      Pertinent Vitals/Pain Pain Assessment: Faces Faces Pain Scale: Hurts whole lot Pain Location: ribs, back (pt reports "locking up") Pain Descriptors / Indicators: Spasm;Grimacing;Guarding Pain Intervention(s): Limited activity within patient's tolerance;Repositioned    Home Living                      Prior Function            PT Goals (current goals can now be found in the care plan section) Progress towards PT goals: Progressing  toward goals    Frequency    Min 3X/week      PT Plan Current plan remains appropriate    Co-evaluation              AM-PAC PT "6 Clicks" Mobility   Outcome Measure  Help needed turning from your back to your side while in a flat bed without using bedrails?: A Little Help needed moving from lying on your back to sitting on the side of a flat bed without using bedrails?: A Little Help needed moving to and from a bed to a chair (including a wheelchair)?: A Little Help needed standing up from a chair using your arms (e.g., wheelchair or bedside chair)?: A Little Help needed to walk in hospital room?: A Little Help needed climbing 3-5 steps with a railing? : A Lot 6 Click Score: 17    End of Session   Activity Tolerance: Patient limited by pain Patient left: with call bell/phone within reach;with family/visitor present(sitting on bsc)   PT Visit Diagnosis: Difficulty in walking, not elsewhere classified (R26.2);Pain;History of falling (Z91.81) Pain - part of body: (ribs, back)     Time: 1516-1616 PT Time Calculation (min) (ACUTE ONLY): 60 min  Charges:  $Therapeutic Activity: 23-37 mins $Self Care/Home Management: 23-37               Jannie Porter, PT Acute Rehabilitation Services Pager: [phone] Office: [phone]   

## 2018-12-02 NOTE — TOC Initial Note (Signed)
Transition of Care [facility]) - Initial/Assessment Note    Patient Details  Name: Derek [name] MRN: [id number] Date of Birth: 04-Apr-1975  Transition of Care [facility]) CM/SW Contact:    Joaquin Courts, RN Phone Number: 12/02/2018, 2:03 PM  Clinical Narrative:                 Patient set up with [facility] home health for [place].  Expected Discharge Plan: [place] Barriers to Discharge: No Barriers Identified   Patient Goals and CMS Choice        Expected Discharge Plan and Services Expected Discharge Plan: [place]   Discharge Planning Services: CM Consult   Living arrangements for the past 2 months: Single Family Home Expected Discharge Date: 12/02/18               DME Arranged: N/A DME Agency: NA       HH Arranged: PT HH Agency: [place] Date HH Agency Contacted: 12/02/18 Time HH Agency Contacted: 33 Representative spoke with at [place]: [place] Arrangements/Services Living arrangements for the past 2 months: [place] with:: Spouse Patient language and need for interpreter reviewed:: Yes Do you feel safe going back to the place where you live?: Yes      Need for Family Participation in Patient Care: Yes (Comment) Care giver support system in place?: Yes (comment)   Criminal Activity/Legal Involvement Pertinent to Current Situation/Hospitalization: No - Comment as needed  Activities of Daily Living Home Assistive Devices/Equipment: None ADL Screening (condition at time of admission) Patient's cognitive ability adequate to safely complete daily activities?: Yes Is the patient deaf or have difficulty hearing?: No Does the patient have difficulty seeing, even when wearing glasses/contacts?: No Does the patient have difficulty concentrating, remembering, or making decisions?: No Patient able to express need for assistance with ADLs?: Yes Does the patient have difficulty dressing or  bathing?: No Independently performs ADLs?: Yes (appropriate for developmental age) Does the patient have difficulty walking or climbing stairs?: No Weakness of Legs: None Weakness of Arms/Hands: None  Permission Sought/Granted                  Emotional Assessment              Admission diagnosis:  Hypercalcemia [E83.52] Acute renal failure, unspecified acute renal failure type ([place]) [N17.9] Acute renal failure (ARF) ([place]) [N17.9] Patient Active Problem List   Diagnosis Date Noted  . Multiple myeloma ([place]) 11/24/2018  . ARF (acute renal failure) ([place]) 11/21/2018  . Low back pain 11/21/2018  . Hypercalcemia 11/21/2018  . Acute renal failure (ARF) ([place]) 11/21/2018  . AKI (acute kidney injury) ([place]) 11/21/2018  . 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 BMI 33.0-33.9,adult 03/27/2013   PCP:  Posey Boyer, MD Pharmacy:   [facility] [phone] [address], [place] [address] Phone: [phone] Fax: [phone]  [place], [place] [address] Phone: [phone] Fax: [phone]     Social Determinants of Health (SDOH) Interventions    Readmission Risk Interventions No flowsheet data found.

## 2018-12-02 NOTE — TOC Progression Note (Signed)
Transition of Care [facility]) - Progression Note    Patient Details  Name: Derek [name] MRN: [id number] Date of Birth: [birth year]  Transition of Care [facility]) CM/SW Contact  Joaquin Courts, RN Phone Number: 12/02/2018, 3:18 PM  Clinical Narrative:   [employer] arranged to deliver 3-in-1 to bedside for home use.     Expected Discharge Plan: [place] Barriers to Discharge: No Barriers Identified  Expected Discharge Plan and Services Expected Discharge Plan: [place]   Discharge Planning Services: CM Consult   Living arrangements for the past 2 months: Single Family Home Expected Discharge Date: 12/02/18               DME Arranged: 3-N-1 DME Agency: [employer] Date DME Agency Contacted: 12/02/18 Time DME Agency Contacted: [phone] Representative spoke with at DME Agency: [place]: PT [place]: [place] Date [place]: 12/02/18 Time [place]: 1357 Representative spoke with at [place]: [place] ([place]) Interventions    Readmission Risk Interventions No flowsheet data found.

## 2018-12-02 NOTE — Progress Notes (Signed)
PROGRESS NOTE    MONTRE HARBOR  FTN:[id number] DOB: 1975/09/01 DOA: 11/20/2018 PCP: Posey Boyer, MD    Brief Narrative:42 y.o.malewithpast medical history significant for back pain for which patient has been seeing orthopedic team. Patient presented with nausea and anorexia. On presentation to the hospital, patient was found to have serumcreatinine of 10.85,BUNof123,WBC count 14.1,total proteinof8.4,calciumof13.6,albumin of4.4andUA showed100 protein. Work-up done was suggestive of multiple myeloma, with CT scan of the chest with contrast revealing lesions in all imaged bones, expansile and destructive mass lesion in the right third rib, pathologic compression fractures of T12 and T5. Nephrology team and oncology team assisting with patient's management. Patient is currently on IV steroids, Velcade and Cytoxan. Renal function has improved significantly. Serum creatinine is down to3.16. Patient has just undergone skeletal survey, and the result is pending. Oncology team is directing care.  Assessment & Plan:   Principal Problem:   ARF (acute renal failure) (HCC) Active Problems:   Low back pain   Hypercalcemia   Acute renal failure (ARF) ([place])   AKI (acute kidney injury) ([place])   Multiple myeloma ([place])   #1 multiple myeloma followed by oncology treated with Cytoxan and Velcade.  Patient and his significant other refused to take him home or be discharged reporting he is weak in his hands and legs.  Patient was seen by PT and OT patient walked around in the room yesterday and had recommended home health PT and OT.  Patient himself never complained of this to me in the past.  He was always moving his extremities.  Patient was ready to go home and was okay to go home when I saw him this morning.  Later on his significant other came in and stated that he does not have any strength in his hands or legs.  I have reconsulted PT and OT.    #2 AKI secondary to  myelomaimproving   #3 T12-L1 compression fracture patient reports having a lot of pain and not being able to walk. Abnormal bone density on CT scan.?Pathologic fracture. Continue pain control with Dilaudid and oxycodone.I have decreased the dose of Dilaudid in preparation for discharge home and started on fentanyl patch continue oxycodone.Patient requiring IV Dilaudid for pain control.  #4 hyponatremiaresolved  #5 hypercalcemiaresolvedCalcium 9.2 11/26/2018. Status post pamidronate and calcitonin. 11/22/2018.  #6 right anterior chest wall pain secondary to multiple myeloma CT of the chest 11/24/2018 with diffuse lytic lesions and destructive mass involving the right third rib pathology compression fractures at T12 and T5. Bone survey 11/26/2018 shows osseous destruction of the right third rib compression fractures at T5 L1-L2 and L4  #7 new onset tachycardiaresolved   VTE Prophylaxis: SCDpatient refuses Lovenox Code Status:Full code Family Communication:None  Disposition Plan:Patient to get next chemo today hopefully planning discharge tomorrow encourage patient to get out of bed PT consulted Consultants nephrology and oncology   Estimated body mass index is 29.18 kg/m as calculated from the following:   Height as of this encounter: 6' (1.829 m).   Weight as of this encounter: 97.6 kg.    Subjective:  Patient resting in bed in no acute distress discussed about going home he agreed with the plan however significant other came in later and refused to take him home then patient also change his mind to go home. Objective: Vitals:   12/01/18 1645 12/01/18 2234 12/02/18 0530 12/02/18 1339  BP: 132/86 126/74 122/79 (!) 154/87  Pulse: (!) 104 (!) 123 87 90  Resp: 15  14 16  Temp: 98.8 F (37.1 C) 98 F (36.7 C) 97.8 F (36.6 C) (!) 97.5 F (36.4 C)  TempSrc: Oral Oral Oral Oral  SpO2: 98% 98% 100% 99%  Weight:      Height:        Intake/Output Summary  (Last 24 hours) at 12/02/2018 1601 Last data filed at 12/02/2018 1446 Gross per 24 hour  Intake 240 ml  Output 1950 ml  Net -1710 ml   Filed Weights   11/28/18 0500 11/29/18 0500 11/30/18 0737  Weight: 98.2 kg 100.9 kg 97.6 kg    Examination:  General exam: Appears calm and comfortable  Respiratory system: Clear to auscultation. Respiratory effort normal. Cardiovascular system: S1 & S2 heard, RRR. No JVD, murmurs, rubs, gallops or clicks. No pedal edema. Gastrointestinal system: Abdomen is nondistended, soft and nontender. No organomegaly or masses felt. Normal bowel sounds heard. Central nervous system: Alert and oriented. No focal neurological deficits. Extremities: Moves all extremities Skin: No rashes, lesions or ulcers Psychiatry: Judgement and insight appear normal. Mood & affect appropriate.     Data Reviewed: I have personally reviewed following labs and imaging studies  CBC: Recent Labs  Lab 11/26/18 0845 11/28/18 0751 11/29/18 0352 11/30/18 0437 12/01/18 0422 12/02/18 0704  WBC 8.7 9.6 7.3 6.9 6.0 10.4  NEUTROABS 7.3  --   --   --  3.9  --   HGB 8.8* 8.9* 9.9* 10.4* 10.5* 10.2*  HCT 26.9* 27.0* 30.8* 32.8* 32.7* 30.8*  MCV 84.1 85.7 86.0 87.7 86.1 85.1  PLT 276 313 276 253 255 176   Basic Metabolic Panel: Recent Labs  Lab 11/26/18 0844 11/26/18 0845  11/29/18 0352 11/30/18 0437 12/01/18 0422 12/02/18 0704 12/02/18 1437  NA 135  --    < > 136 137 135 136 137  K 4.1  --    < > 4.6 4.6 4.8 5.5* 4.6  CL 105  --    < > 100 102 103 104 105  CO2 23  --    < > _0 21* 20*  GLUCOSE 144*  --    < > 94 110* 104* 112* 88  BUN 56*  --    < > 48* 38* 33* 38* 35*  CREATININE 4.66*  --    < > 3.47* 3.16* 3.09* 2.77* 2.61*  CALCIUM 9.2  --    < > 8.7* 8.2* 8.5* 8.9 9.2  MG  --  2.0  --   --   --   --   --   --   PHOS 3.6  --   --  2.9 4.3 3.3  --   --    < > = values in this interval not displayed.   GFR: Estimated Creatinine Clearance: 43.7 mL/min (A)  (by C-G formula based on SCr of 2.61 mg/dL (H)). Liver Function Tests: Recent Labs  Lab 11/26/18 0844 11/29/18 0352 11/30/18 0437 12/01/18 0422  AST  --   --   --  24  ALT  --   --   --  79*  ALKPHOS  --   --   --  225*  BILITOT  --   --   --  0.5  PROT  --   --   --  6.5  ALBUMIN 3.1* 3.2* 3.2* 3.3*   No results for input(s): LIPASE, AMYLASE in the last 168 hours. No results for input(s): AMMONIA in the last 168 hours. Coagulation Profile: Recent Labs  Lab 11/28/18 0402  INR  1.1   Cardiac Enzymes: No results for input(s): CKTOTAL, CKMB, CKMBINDEX, TROPONINI in the last 168 hours. BNP (last 3 results) No results for input(s): PROBNP in the last 8760 hours. HbA1C: No results for input(s): HGBA1C in the last 72 hours. CBG: Recent Labs  Lab 12/01/18 1114 12/01/18 1639 12/01/18 2310 12/02/18 0728 12/02/18 1133  GLUCAP 142* 145* 211* 108* 131*   Lipid Profile: No results for input(s): CHOL, HDL, LDLCALC, TRIG, CHOLHDL, LDLDIRECT in the last 72 hours. Thyroid Function Tests: No results for input(s): TSH, T4TOTAL, FREET4, T3FREE, THYROIDAB in the last 72 hours. Anemia Panel: No results for input(s): VITAMINB12, FOLATE, FERRITIN, TIBC, IRON, RETICCTPCT in the last 72 hours. Sepsis Labs: No results for input(s): PROCALCITON, LATICACIDVEN in the last 168 hours.  Recent Results (from the past 240 hour(s))  Culture, Urine     Status: Abnormal   Collection Time: 11/24/18  7:22 AM   Specimen: Urine, Clean Catch  Result Value Ref Range Status   Specimen Description   Final    URINE, CLEAN CATCH Performed at [facility], [place] [address] [place], [place] [address]    Special Requests   Final    NONE Performed at [facility], [place] [address], [place], [place] [address]    Culture (A)  Final    60,000 COLONIES/mL MULTIPLE SPECIES PRESENT, SUGGEST RECOLLECTION   Report Status 11/25/2018 FINAL  Final  Culture, blood (routine x 2)      Status: None   Collection Time: 11/24/18  7:57 AM   Specimen: BLOOD LEFT HAND  Result Value Ref Range Status   Specimen Description   Final    BLOOD LEFT HAND Performed at [place] [address], [place], [place] [address]    Special Requests   Final    BOTTLES DRAWN AEROBIC AND ANAEROBIC Blood Culture adequate volume Performed at [place] [address], [place], [place] [address]    Culture   Final    NO GROWTH 5 DAYS Performed at [facility], [place] [address] [place], [place] [address]    Report Status 11/29/2018 FINAL  Final  Culture, blood (routine x 2)     Status: None   Collection Time: 11/24/18  7:58 AM   Specimen: BLOOD RIGHT HAND  Result Value Ref Range Status   Specimen Description   Final    BLOOD RIGHT HAND Performed at [place] [address], [place], [place] [address]    Special Requests   Final    BOTTLES DRAWN AEROBIC AND ANAEROBIC Blood Culture adequate volume Performed at [place] [address] [place], [place] [address]    Culture   Final    NO GROWTH 5 DAYS Performed at [facility], [place] [address] [place], [place] [address]    Report Status 11/29/2018 FINAL  Final         Radiology Studies: No results found.      Scheduled Meds: . docusate sodium  200 mg Oral BID  . enoxaparin (LOVENOX) injection  50 mg Subcutaneous Q24H  . fentaNYL  1 patch Transdermal Q72H  . lidocaine  1 patch Transdermal Q24H  . ondansetron  4 mg Oral Once   Continuous Infusions:   LOS: 11 days    Georgette Shell, MD [facility] If 7PM-7AM, please contact night-coverage [email or web address] Password [facility] 12/02/2018, 4:01 PM

## 2018-12-02 NOTE — Discharge Summary (Signed)
Physician Discharge Summary  Derek [name] OMB:[id number] DOB: 28-Sep-1975 DOA: 11/20/2018  PCP: Posey Boyer, MD  Admit date: 11/20/2018 Discharge date: 12/02/2018  Admitted From: Home  disposition: Home  Recommendations for Outpatient Follow-up:  1. Follow up with PCP in 1-2 weeks 2. Please obtain BMP/CBC in one week Please follow up with Dr. Learta Codding oncology Home Health yes  equipment/Devices: None  Discharge Condition: Stable CODE STATUS full code Diet recommendation: Cardiac Brief/Interim Summary:43 y.o.malewithpast medical history significant for back pain for which patient has been seeing orthopedic team. Patient presented with nausea and anorexia. On presentation to the hospital, patient was found to have serumcreatinine of 10.85,BUNof123,WBC count 14.1,total proteinof8.4,calciumof13.6,albumin of4.4andUA showed100 protein. Work-up done was suggestive of multiple myeloma, with CT scan of the chest with contrast revealing lesions in all imaged bones, expansile and destructive mass lesion in the right third rib, pathologic compression fractures of T12 and T5. Nephrology team and oncology team assisting with patient's management. Patient is currently on IV steroids, Velcade and Cytoxan. Renal function has improved significantly. Serum creatinine is down to3.16. Patient has just undergone skeletal survey, and the result is pending. Oncology team is directing care  Discharge Diagnoses:  Principal Problem:   ARF (acute renal failure) ([place]) Active Problems:   Low back pain   Hypercalcemia   Acute renal failure (ARF) ([place])   AKI (acute kidney injury) ([place])   Multiple myeloma ([place])  #1 multiple myeloma-patient received chemotherapy with Cytoxan and Velcade during this hospital stay.  He also received radiation therapy.  Patient tolerated the chemo well.  He needs to follow-up with Dr. Ammie Dalton.patient was seen by physical therapy recommended home health  PT.  #2 AKI secondary to myelomaimproving .  His creatinine on the day of discharge was 2.77.  Patient was followed by a nephrologist. F/u with dr patel  #3 T12-L1 compression fracture continue pain control with fentanyl patch and oxycodone.    #4 hyponatremiaresolved  #5 hypercalcemiaresolved. Status post pamidronate and calcitonin. 11/22/2018.  #6 right anterior chest wall pain secondary to multiple myeloma CT of the chest 11/24/2018 with diffuse lytic lesions and destructive mass involving the right third rib pathology compression fractures at T12 and T5. Bone survey 11/26/2018 shows osseous destruction of the right third rib compression fractures at T5 L1-L2 and L4  #7 new onset tachycardiaresolved   Estimated body mass index is 29.18 kg/m as calculated from the following:   Height as of this encounter: 6' (1.829 m).   Weight as of this encounter: 97.6 kg.  Discharge Instructions  Discharge Instructions    Call MD for:  difficulty breathing, headache or visual disturbances   Complete by: As directed    Call MD for:  persistant nausea and vomiting   Complete by: As directed    Diet - low sodium heart healthy   Complete by: As directed    Increase activity slowly   Complete by: As directed      Allergies as of 12/02/2018      Reactions   Pork-derived Products Nausea And Vomiting   Aspirin Hives, Anxiety      Medication List    STOP taking these medications   DOANS PILLS PO   HYDROcodone-acetaminophen 5-325 MG tablet Commonly known as: NORCO/VICODIN   ibuprofen 200 MG tablet Commonly known as: ADVIL   methylPREDNISolone 4 MG Tbpk tablet Commonly known as: MEDROL DOSEPAK   nabumetone 750 MG tablet Commonly known as: RELAFEN     TAKE these medications   ALPRAZolam 0.25  MG tablet Commonly known as: XANAX Take 1 tablet (0.25 mg total) by mouth 2 (two) times daily as needed for anxiety.   baclofen 10 MG tablet Commonly known as: LIORESAL Take 0.5-1  tablets (5-10 mg total) by mouth 3 (three) times daily as needed for muscle spasms.   Biotin 1000 MCG Chew Chew 1,000 mg by mouth daily.   fentaNYL 25 MCG/HR Commonly known as: [place] 1 patch onto the skin every 3 (three) days. Start taking on: December 04, 2018   multivitamin with minerals Tabs tablet Take 1 tablet by mouth daily.   ondansetron 4 MG tablet Commonly known as: ZOFRAN Take 1 tablet (4 mg total) by mouth every 8 (eight) hours as needed for nausea or vomiting.   oxyCODONE 5 MG immediate release tablet Commonly known as: Oxy IR/ROXICODONE Take 1 tablet (5 mg total) by mouth every 4 (four) hours as needed for moderate pain.      Follow-up Information    Elmarie Shiley, MD Follow up on 12/20/2018.   Specialty: Nephrology Why: Appointment with Stephania Fragmin P.A. at 12:30PM. [facility] labs at [facility] every wednesday after discharge until appontment Contact information: [place] [address] [phone]        Posey Boyer, MD Follow up.   Specialty: Family Medicine Contact information: [place] [address] [phone]        Ladell Pier, MD Follow up.   Specialty: Oncology Contact information: [place] [address] [phone]          Allergies  Allergen Reactions  . Pork-Derived Products Nausea And Vomiting  . Aspirin Hives and Anxiety    Consultations: Oncology and nephrology  Procedures/Studies: Ct Chest Wo Contrast  Result Date: 11/24/2018 CLINICAL DATA:  History of multiple myeloma. Destructive mass in the right third rib. Bilateral lower extremity weakness and falls. EXAM: CT CHEST WITHOUT CONTRAST TECHNIQUE: Multidetector CT imaging of the chest was performed following the standard protocol without IV contrast. COMPARISON:  Single-view of the chest 11/24/2018. Plain films of the ribs 11/17/2018. FINDINGS: Cardiovascular: Heart size is upper normal. No pericardial  effusion. No aortic aneurysm. Mediastinum/Nodes: No enlarged mediastinal or axillary lymph nodes. Thyroid gland, trachea, and esophagus demonstrate no significant findings. Lungs/Pleura: No pleural effusion. Mild dependent atelectasis. Lungs otherwise clear. Upper Abdomen: Negative. Musculoskeletal: Lytic lesions are seen in all imaged bones. A large destructive lesion with an associated soft tissue mass in the right third rib is identified. The mass measures approximately 8 cm AP by 4 cm transverse by 4 cm craniocaudal. Also seen is a mild superior endplate compression fracture of T12 with vertebral body height loss of approximately 10%. More severe compression fracture of T5 is identified with vertebral body height loss of up to 80-90%. There is mild bony retropulsion off the superior end endplate of T5 but the central canal and foramina appear open. IMPRESSION: Lytic lesions in all imaged bones consistent with the patient's history of multiple myeloma. Expansile, destructive mass lesion in the right third rib is also likely due to myeloma. Pathologic compression fractures of T12 and T5. T5 fracture is worse where there is vertebral body height loss of 80-90% and mild bony retropulsion off the superior endplate. No resultant central canal stenosis. No acute cardiopulmonary disease. Electronically Signed   By: Inge Rise M.D.   On: 11/24/2018 20:15   Mr Lumbar Spine W/o Contrast  Result Date: 11/09/2018 CLINICAL DATA:  Back pain after fall on Monday. EXAM:  MRI LUMBAR SPINE WITHOUT CONTRAST TECHNIQUE: Multiplanar, multisequence MR imaging of the lumbar spine was performed. No intravenous contrast was administered. COMPARISON:  Radiographs from 11/01/2018 FINDINGS: Segmentation: The lowest lumbar type non-rib-bearing vertebra is labeled as L5. Alignment:  No vertebral subluxation is observed. Vertebrae: Chronic mild anterior wedge compression fractures at T12 and L1. Disc desiccation at L4-5. No significant  vertebral marrow edema is identified. Conus medullaris and cauda equina: Conus extends to the L1 level. Conus and cauda equina appear normal. Paraspinal and other soft tissues: Unremarkable Disc levels: L1-2: Unremarkable. L2-3: Unremarkable. L3-4: Unremarkable L4-5: Borderline left subarticular lateral recess stenosis due to disc bulge, but without overt impingement. L5-S1: Unremarkable. IMPRESSION: 1. Disc bulge at L4-5 causing borderline left subarticular lateral recess stenosis but without overt impingement. 2. 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 Electronically Signed   By: Van Clines M.D.   On: 11/09/2018 08:23   US Renal  Result Date: 11/22/2018 CLINICAL DATA:  Acute kidney injury. EXAM: RENAL / URINARY TRACT ULTRASOUND COMPLETE COMPARISON:  CT scan of November 21, 2018. FINDINGS: Right Kidney: Renal measurements: 11.6 x 6.1 x 5.0 cm = volume: 183 mL. Increased echogenicity of renal parenchyma is noted. No mass or hydronephrosis visualized. Left Kidney: Renal measurements: 13.2 x 6.1 x 5.4 cm = volume: 229 mL. Increased echogenicity of renal parenchyma is noted. No mass or hydronephrosis visualized. Bladder: Appears normal for degree of bladder distention. IMPRESSION: Increased echogenicity of renal parenchyma is noted bilaterally suggesting medical renal disease. No hydronephrosis or renal obstruction is noted. Electronically Signed   By: Marijo Conception M.D.   On: 11/22/2018 14:24   Ct Biopsy  Result Date: 11/28/2018 INDICATION: 43 year old male with multiple myeloma and progressive osseous lesions. He presents for bone marrow biopsy. EXAM: CT GUIDED BONE MARROW ASPIRATION AND CORE BIOPSY Interventional Radiologist:  Criselda Peaches, MD MEDICATIONS: None. ANESTHESIA/SEDATION: Moderate (conscious) sedation was employed during this procedure. A total of 4 milligrams versed and 150 micrograms fentanyl were administered intravenously. The patient's level of  consciousness and vital signs were monitored continuously by radiology nursing throughout the procedure under my direct supervision. Total monitored sedation time: 10 minutes FLUOROSCOPY TIME:  None COMPLICATIONS: None immediate. Estimated blood loss: <25 mL PROCEDURE: Informed written consent was obtained from the patient after a thorough discussion of the procedural risks, benefits and alternatives. All questions were addressed. Maximal Sterile Barrier Technique was utilized including caps, mask, sterile gowns, sterile gloves, sterile drape, hand hygiene and skin antiseptic. A timeout was performed prior to the initiation of the procedure. The patient was positioned prone and non-contrast localization CT was performed of the pelvis to demonstrate the iliac marrow spaces. Maximal barrier sterile technique utilized including caps, mask, sterile gowns, sterile gloves, large sterile drape, hand hygiene, and betadine prep. Under sterile conditions and local anesthesia, an 11 gauge coaxial bone biopsy needle was advanced into the right iliac marrow space. Needle position was confirmed with CT imaging. Initially, bone marrow aspiration was performed. Next, the 11 gauge outer cannula was utilized to obtain a right iliac bone marrow core biopsy. Needle was removed. Hemostasis was obtained with compression. The patient tolerated the procedure well. Samples were prepared with the cytotechnologist. IMPRESSION: Technically successful CT-guided bone marrow aspiration and core biopsy of the right iliac bone. Electronically Signed   By: Jacqulynn Cadet M.D.   On: 11/28/2018 13:14   Dg Chest Port 1 View  Result Date: 11/24/2018 CLINICAL DATA:  Fever. EXAM: PORTABLE CHEST  1 VIEW COMPARISON:  Rib radiographs 11/17/2018 FINDINGS: The cardiac silhouette is borderline enlarged. The lungs are mildly hypoinflated with similar appearance of mild interstitial coarsening. No confluent airspace opacity, overt pulmonary edema, sizable  pleural effusion, or pneumothorax is identified. A soft tissue mass and associated destruction of the right lateral third rib are again seen. IMPRESSION: 1. Mild hypoinflation.  No consolidation. 2. Destructive mass involving the right lateral third rib. Electronically Signed   By: Logan Bores M.D.   On: 11/24/2018 10:04   Dg Bone Survey Met  Result Date: 11/26/2018 CLINICAL DATA:  43 year old African-American male recently diagnosed with multiple myeloma whose chief complaint is lower back pain. Pt reported he started chemotherapy two days ago. EXAM: METASTATIC BONE SURVEY COMPARISON:  11/24/2018 CT chest, 11/21/2018 CT abdomen and pelvis FINDINGS: Images of the axial and appendicular skeleton are performed. Calvarium:No discrete lytic or blastic lesions. Chest and Abdomen:Heart size is accentuated by technique. Soft tissue mass is identified along the LATERAL RIGHT UPPER lobe. There is associated destruction of the RIGHT third rib. No pulmonary edema. Mottled appearance of the ribs, better seen on CT exam. Spine:Marked compression fracture of T5. Mild anterior wedge compression fracture T12. Mild anterior wedge compression fractures of L1, L2, and L4, with approximately 10-15% loss of anterior height. The age of these fractures is indeterminate. UPPER extremities:No discrete lytic or blastic lesions. Pelvis and LOWER extremities:Mottled appearance of the bones of the pelvis. No acute fracture. Bone island within the distal LEFT femur. No discrete lytic or blastic lesions. IMPRESSION: 1. Soft tissue mass along the RIGHT LATERAL lobe UPPER lobe associated with osseous destruction of the RIGHT third rib. 2. Mottled appearance of the spine, ribs, and pelvis. 3. Compression fractures of T5, L1, and L2, and L4. Electronically Signed   By: [name] M.D.   On: 11/26/2018 14:49   Ct Bone Marrow Biopsy & Aspiration  Result Date: 11/28/2018 INDICATION: 43 year old male with multiple myeloma and progressive  osseous lesions. He presents for bone marrow biopsy. EXAM: CT GUIDED BONE MARROW ASPIRATION AND CORE BIOPSY Interventional Radiologist:  Criselda Peaches, MD MEDICATIONS: None. ANESTHESIA/SEDATION: Moderate (conscious) sedation was employed during this procedure. A total of 4 milligrams versed and 150 micrograms fentanyl were administered intravenously. The patient's level of consciousness and vital signs were monitored continuously by radiology nursing throughout the procedure under my direct supervision. Total monitored sedation time: 10 minutes FLUOROSCOPY TIME:  None COMPLICATIONS: None immediate. Estimated blood loss: <25 mL PROCEDURE: Informed written consent was obtained from the patient after a thorough discussion of the procedural risks, benefits and alternatives. All questions were addressed. Maximal Sterile Barrier Technique was utilized including caps, mask, sterile gowns, sterile gloves, sterile drape, hand hygiene and skin antiseptic. A timeout was performed prior to the initiation of the procedure. The patient was positioned prone and non-contrast localization CT was performed of the pelvis to demonstrate the iliac marrow spaces. Maximal barrier sterile technique utilized including caps, mask, sterile gowns, sterile gloves, large sterile drape, hand hygiene, and betadine prep. Under sterile conditions and local anesthesia, an 11 gauge coaxial bone biopsy needle was advanced into the right iliac marrow space. Needle position was confirmed with CT imaging. Initially, bone marrow aspiration was performed. Next, the 11 gauge outer cannula was utilized to obtain a right iliac bone marrow core biopsy. Needle was removed. Hemostasis was obtained with compression. The patient tolerated the procedure well. Samples were prepared with the cytotechnologist. IMPRESSION: Technically successful CT-guided bone marrow aspiration  and core biopsy of the right iliac bone. Electronically Signed   By: Jacqulynn Cadet  M.D.   On: 11/28/2018 13:14   Ct Renal Stone Study  Result Date: 11/21/2018 CLINICAL DATA:  Flank pain. Stone disease suspected. Patient reports low back pain with 3 falls in August. EXAM: CT ABDOMEN AND PELVIS WITHOUT CONTRAST TECHNIQUE: Multidetector CT imaging of the abdomen and pelvis was performed following the standard protocol without IV contrast. COMPARISON:  Lumbar spine MRI 11/08/2018. FINDINGS: Lower chest: The lung bases are clear. Hepatobiliary: No focal liver abnormality is seen. No gallstones, gallbladder wall thickening, or biliary dilatation. Pancreas: No ductal dilatation or inflammation. Spleen: Normal in size without focal abnormality. Adrenals/Urinary Tract: Normal adrenal glands. No no hydronephrosis. Punctate nonobstructing stone in the lower left kidney. No perinephric edema. Both ureters are decompressed without stone along the course. Urinary bladder is physiologically distended, no bladder wall thickening or stone. Stomach/Bowel: Stomach is within normal limits. Appendix appears normal. No evidence of bowel wall thickening, distention, or inflammatory changes. Vascular/Lymphatic: Mild aortic atherosclerosis. No aneurysm. No enlarged lymph nodes in the abdomen or pelvis. Reproductive: Prostate is unremarkable. Other: No free air, free fluid, or intra-abdominal fluid collection. Tiny fat containing umbilical hernia. Musculoskeletal: Chronic superior endplate compression fractures of T12 and L1 prominent Schmorl's node superior endplate of L2. Bone marrow involving the spine and to a lesser extent pelvis is diffusely heterogeneous in generally low-density. Bone island in the right sacrum. IMPRESSION: 1. Punctate nonobstructing stone in the lower left kidney. No hydronephrosis or obstructive uropathy. 2. No acute abnormality in the abdomen/pelvis. 3. Diffusely heterogeneous and abnormal appearance of the bone marrow primarily involving the spine, suggesting underlying metabolic etiology,  including but not limited to hyperparathyroidism. No focal abnormality was seen on recent lumbar spine MRI. Chronic mild compression deformities of T12 and L1 are unchanged. Aortic Atherosclerosis (ICD10-I70.0). Electronically Signed   By: Keith Rake M.D.   On: 11/21/2018 02:44    (Echo, Carotid, EGD, Colonoscopy, ERCP)    Subjective: Resting in bed no new complaints  Discharge Exam: Vitals:   12/01/18 2234 12/02/18 0530  BP: 126/74 122/79  Pulse: (!) 123 87  Resp:  14  Temp: 98 F (36.7 C) 97.8 F (36.6 C)  SpO2: 98% 100%   Vitals:   12/01/18 1010 12/01/18 1645 12/01/18 2234 12/02/18 0530  BP:  132/86 126/74 122/79  Pulse:  (!) 104 (!) 123 87  Resp:  15  14  Temp: 99.4 F (37.4 C) 98.8 F (37.1 C) 98 F (36.7 C) 97.8 F (36.6 C)  TempSrc: Oral Oral Oral Oral  SpO2:  98% 98% 100%  Weight:      Height:        General: Pt is alert, awake, not in acute distress Cardiovascular: RRR, S1/S2 +, no rubs, no gallops Respiratory: CTA bilaterally, no wheezing, no rhonchi Abdominal: Soft, NT, ND, bowel sounds + Extremities: no edema, no cyanosis    The results of significant diagnostics from this hospitalization (including imaging, microbiology, ancillary and laboratory) are listed below for reference.     Microbiology: Recent Results (from the past 240 hour(s))  Culture, Urine     Status: Abnormal   Collection Time: 11/24/18  7:22 AM   Specimen: Urine, Clean Catch  Result Value Ref Range Status   Specimen Description   Final    URINE, CLEAN CATCH Performed at [facility], [place] [address] [place], [place] [address]    Special Requests   Final  NONE Performed at [facility], [place] [address], [place], [place] [address]    Culture (A)  Final    60,000 COLONIES/mL MULTIPLE SPECIES PRESENT, SUGGEST RECOLLECTION   Report Status 11/25/2018 FINAL  Final  Culture, blood (routine x 2)     Status: None   Collection Time:  11/24/18  7:57 AM   Specimen: BLOOD LEFT HAND  Result Value Ref Range Status   Specimen Description   Final    BLOOD LEFT HAND Performed at [place] [address], [place], [place] [address]    Special Requests   Final    BOTTLES DRAWN AEROBIC AND ANAEROBIC Blood Culture adequate volume Performed at [place] [facility] [address], [place], [place] [address]    Culture   Final    NO GROWTH 5 DAYS Performed at [facility], [place] [address], [place], [place] [address]    Report Status 11/29/2018 FINAL  Final  Culture, blood (routine x 2)     Status: None   Collection Time: 11/24/18  7:58 AM   Specimen: BLOOD RIGHT HAND  Result Value Ref Range Status   Specimen Description   Final    BLOOD RIGHT HAND Performed at [facility] [place] [address], [place], [place] [address]    Special Requests   Final    BOTTLES DRAWN AEROBIC AND ANAEROBIC Blood Culture adequate volume Performed at [place] [address] [place], [place] [address]    Culture   Final    NO GROWTH 5 DAYS Performed at [facility], [place] [address], [place], [place] [address]    Report Status 11/29/2018 FINAL  Final     Labs: BNP (last 3 results) No results for input(s): BNP in the last 8760 hours. Basic Metabolic Panel: Recent Labs  Lab 11/26/18 0844 11/26/18 0845  11/28/18 0402 11/29/18 0352 11/30/18 0437 12/01/18 0422 12/02/18 0704  NA 135  --    < > 139 136 137 135 136  K 4.1  --    < > 4.3 4.6 4.6 4.8 5.5*  CL 105  --    < > 98 100 102 103 104  CO2 23  --    < > '29 26 23 24 ' 21*  GLUCOSE 144*  --    < > 109* 94 110* 104* 112*  BUN 56*  --    < > 60* 48* 38* 33* 38*  CREATININE 4.66*  --    < > 3.96* 3.47* 3.16* 3.09* 2.77*  CALCIUM 9.2  --    < > 9.0 8.7* 8.2* 8.5* 8.9  MG  --  2.0  --   --   --   --   --   --   PHOS 3.6  --   --   --  2.9 4.3 3.3  --    < > = values in this interval not displayed.    Liver Function Tests: Recent Labs  Lab 11/26/18 0844 11/29/18 0352 11/30/18 0437 12/01/18 0422  AST  --   --   --  24  ALT  --   --   --  79*  ALKPHOS  --   --   --  225*  BILITOT  --   --   --  0.5  PROT  --   --   --  6.5  ALBUMIN 3.1* 3.2* 3.2* 3.3*   No results for input(s): LIPASE, AMYLASE  in the last 168 hours. No results for input(s): AMMONIA in the last 168 hours. CBC: Recent Labs  Lab 11/26/18 0845 11/28/18 0751 11/29/18 0352 11/30/18 0437 12/01/18 0422 12/02/18 0704  WBC 8.7 9.6 7.3 6.9 6.0 10.4  NEUTROABS 7.3  --   --   --  3.9  --   HGB 8.8* 8.9* 9.9* 10.4* 10.5* 10.2*  HCT 26.9* 27.0* 30.8* 32.8* 32.7* 30.8*  MCV 84.1 85.7 86.0 87.7 86.1 85.1  PLT 276 313 276 253 255 230   Cardiac Enzymes: No results for input(s): CKTOTAL, CKMB, CKMBINDEX, TROPONINI in the last 168 hours. BNP: Invalid input(s): POCBNP CBG: Recent Labs  Lab 12/01/18 0814 12/01/18 1114 12/01/18 1639 12/01/18 2310 12/02/18 0728  GLUCAP 97 142* 145* 211* 108*   D-Dimer No results for input(s): DDIMER in the last 72 hours. Hgb A1c No results for input(s): HGBA1C in the last 72 hours. Lipid Profile No results for input(s): CHOL, HDL, LDLCALC, TRIG, CHOLHDL, LDLDIRECT in the last 72 hours. Thyroid function studies No results for input(s): TSH, T4TOTAL, T3FREE, THYROIDAB in the last 72 hours.  Invalid input(s): FREET3 Anemia work up No results for input(s): VITAMINB12, FOLATE, FERRITIN, TIBC, IRON, RETICCTPCT in the last 72 hours. Urinalysis    Component Value Date/Time   COLORURINE STRAW (A) 11/20/2018 2229   APPEARANCEUR CLEAR 11/20/2018 2229   LABSPEC 1.013 11/20/2018 2229   PHURINE 5.0 11/20/2018 2229   GLUCOSEU NEGATIVE 11/20/2018 2229   HGBUR MODERATE (A) 11/20/2018 2229   BILIRUBINUR NEGATIVE 11/20/2018 [place] 11/20/2018 2229   PROTEINUR 100 (A) 11/20/2018 2229   NITRITE NEGATIVE 11/20/2018 2229   LEUKOCYTESUR NEGATIVE 11/20/2018 2229   Sepsis  Labs Invalid input(s): PROCALCITONIN,  WBC,  LACTICIDVEN Microbiology Recent Results (from the past 240 hour(s))  Culture, Urine     Status: Abnormal   Collection Time: 11/24/18  7:22 AM   Specimen: Urine, Clean Catch  Result Value Ref Range Status   Specimen Description   Final    URINE, CLEAN CATCH Performed at [facility], [place] [address] [place], [place] [address]    Special Requests   Final    NONE Performed at [facility], [place] [address] [place], [place] [address]    Culture (A)  Final    60,000 COLONIES/mL MULTIPLE SPECIES PRESENT, SUGGEST RECOLLECTION   Report Status 11/25/2018 FINAL  Final  Culture, blood (routine x 2)     Status: None   Collection Time: 11/24/18  7:57 AM   Specimen: BLOOD LEFT HAND  Result Value Ref Range Status   Specimen Description   Final    BLOOD LEFT HAND Performed at [facility] [address], [place], [place] [address]    Special Requests   Final    BOTTLES DRAWN AEROBIC AND ANAEROBIC Blood Culture adequate volume Performed at [facility] [address] [place], [place] [address]    Culture   Final    NO GROWTH 5 DAYS Performed at [facility], [place] [address], [place], [place] [address]    Report Status 11/29/2018 FINAL  Final  Culture, blood (routine x 2)     Status: None   Collection Time: 11/24/18  7:58 AM   Specimen: BLOOD RIGHT HAND  Result Value Ref Range Status   Specimen Description   Final    BLOOD RIGHT HAND Performed at [facility] [address] [place], [place] [address]  Special Requests   Final    BOTTLES DRAWN AEROBIC AND ANAEROBIC Blood Culture adequate volume Performed at [facility] [address] [place], [place] [address]    Culture   Final    NO GROWTH 5 DAYS Performed at [facility], [place] [address], [place], [place] [address]    Report Status 11/29/2018 FINAL  Final      Time coordinating discharge: 33 minutes  SIGNED:   Georgette Shell, MD  [facility] 12/02/2018, 11:19 AM Pager   If 7PM-7AM, please contact night-coverage [email or web address] Password [email or web address]

## 2018-12-02 NOTE — Progress Notes (Signed)
OT Cancellation Note  Patient Details Name: Derek [name] MRN: [id number] DOB: 1975-12-20   Cancelled Treatment:    Reason Eval/Treat Not Completed: Other (comment)  Order received.  Pts DC now canceled.  Pt on toilet. Will check on pt next day  Kari Baars, [place] [phone] Office- [phone], Thereasa Parkin 12/02/2018, 4:35 PM

## 2018-12-03 ENCOUNTER — Inpatient Hospital Stay (HOSPITAL_COMMUNITY): Payer: BC Managed Care – PPO

## 2018-12-03 LAB — CBC
HCT: 40.8 % (ref 39.0–52.0)
Hemoglobin: 13.4 g/dL (ref 13.0–17.0)
MCH: 27.7 pg (ref 26.0–34.0)
MCHC: 32.8 g/dL (ref 30.0–36.0)
MCV: 84.5 fL (ref 80.0–100.0)
Platelets: 238 10*3/uL (ref 150–400)
RBC: 4.83 MIL/uL (ref 4.22–5.81)
RDW: 13.8 % (ref 11.5–15.5)
WBC: 8.2 10*3/uL (ref 4.0–10.5)
nRBC: 0 % (ref 0.0–0.2)

## 2018-12-03 LAB — COMPREHENSIVE METABOLIC PANEL
ALT: 130 U/L — ABNORMAL HIGH (ref 0–44)
AST: 49 U/L — ABNORMAL HIGH (ref 15–41)
Albumin: 3.4 g/dL — ABNORMAL LOW (ref 3.5–5.0)
Alkaline Phosphatase: 239 U/L — ABNORMAL HIGH (ref 38–126)
Anion gap: 12 (ref 5–15)
BUN: 32 mg/dL — ABNORMAL HIGH (ref 6–20)
CO2: 23 mmol/L (ref 22–32)
Calcium: 8.9 mg/dL (ref 8.9–10.3)
Chloride: 102 mmol/L (ref 98–111)
Creatinine, Ser: 2.81 mg/dL — ABNORMAL HIGH (ref 0.61–1.24)
GFR calc Af Amer: 30 mL/min — ABNORMAL LOW (ref >60)
GFR calc non Af Amer: 26 mL/min — ABNORMAL LOW (ref >60)
Glucose, Bld: 102 mg/dL — ABNORMAL HIGH (ref 70–99)
Potassium: 5.1 mmol/L (ref 3.5–5.1)
Sodium: 137 mmol/L (ref 135–145)
Total Bilirubin: 0.4 mg/dL (ref 0.3–1.2)
Total Protein: 7.1 g/dL (ref 6.5–8.1)

## 2018-12-03 LAB — URINALYSIS, ROUTINE W REFLEX MICROSCOPIC
Bacteria, UA: NONE SEEN
Bilirubin Urine: NEGATIVE
Glucose, UA: NEGATIVE mg/dL
Hgb urine dipstick: NEGATIVE
Ketones, ur: NEGATIVE mg/dL
Leukocytes,Ua: NEGATIVE
Nitrite: NEGATIVE
Protein, ur: 30 mg/dL — AB
Specific Gravity, Urine: 1.006 (ref 1.005–1.030)
pH: 7 (ref 5.0–8.0)

## 2018-12-03 LAB — GLUCOSE, CAPILLARY
Glucose-Capillary: 107 mg/dL — ABNORMAL HIGH (ref 70–99)
Glucose-Capillary: 106 mg/dL — ABNORMAL HIGH (ref 70–99)
Glucose-Capillary: 128 mg/dL — ABNORMAL HIGH (ref 70–99)

## 2018-12-03 MED ORDER — SODIUM CHLORIDE 0.45 % IV SOLN
INTRAVENOUS | Status: DC
  Administered 2018-12-03 – 2018-12-04 (×2): via INTRAVENOUS

## 2018-12-03 MED ORDER — METOPROLOL TARTRATE 25 MG PO TABS
25.0000 mg | ORAL_TABLET | Freq: Two times a day (BID) | ORAL | Status: DC
  Administered 2018-12-03 – 2018-12-04 (×2): 25 mg via ORAL
  Filled 2018-12-03 (×2): qty 1

## 2018-12-03 MED ORDER — METOPROLOL TARTRATE 5 MG/5ML IV SOLN
10.0000 mg | Freq: Once | INTRAVENOUS | Status: AC
  Administered 2018-12-03: 10 mg via INTRAVENOUS
  Filled 2018-12-03: qty 10

## 2018-12-03 NOTE — Progress Notes (Signed)
   Vital Signs MEWS/VS Documentation      12/03/2018 0700 12/03/2018 0730 12/03/2018 1334 12/03/2018 1533   MEWS Score:  1  1  2  3    MEWS Score Color:  Green  Green  Yellow  Yellow   Resp:  -  -  16  18   Pulse:  -  -  (!) 114  (!) 111   BP:  -  -  (!) 154/99  (!) 150/101   Temp:  -  -  100.1 F (37.8 C)  (!) 101.2 F (38.4 C)   O2 Device:  -  -  Room Air  Room Air   Level of Consciousness:  -  Alert  -  -        MD notified, orders received.    Rene Paci 12/03/2018,4:03 PM

## 2018-12-03 NOTE — Evaluation (Signed)
Occupational Therapy Evaluation Patient Details Name: JEHAD BISONO MRN: [id number] DOB: 06-10-1975 Today's Date: 12/03/2018    History of Present Illness Pt is a 43 year old male admitted for back pain and work up suggestive of multiple myeloma. CT of the chest 11/24/2018 with diffuse lytic lesions and destructive mass involving the right third rib pathology compression fractures at T12 and T5. Bone survey 11/26/2018 shows osseous destruction of the right third rib compression fractures at T5 L1-L2 and L4   Clinical Impression   Pt admitted with back pain. Pt currently with functional limitations due to the deficits listed below (see OT Problem List).  Pt will benefit from skilled OT to increase their safety and independence with ADL and functional mobility for ADL to facilitate discharge to venue listed below.   Wife very supportive.  Wife had obtained a wedge to use at home for positioning.  Upon sitting pts HR increased to over 150 and RN came in room. Pt returned to supine.       Follow Up Recommendations  Home health OT;Supervision/Assistance - 24 hour    Equipment Recommendations  3 in 1 bedside commode    Recommendations for Other Services       Precautions / Restrictions Precautions Precaution Comments: back for comfort, pt reports increased pain with rib fractures/lesions Restrictions Weight Bearing Restrictions: No      Mobility Bed Mobility Overal bed mobility: Needs Assistance Bed Mobility: Rolling;Sidelying to Sit;Supine to Sit;Sit to Supine           General bed mobility comments: HOB elevated with use of rail, slow to mobilize due to pain however no physical assist required  Transfers                 General transfer comment: did not stand due to RN coming in room to report increased HR    Balance                                           ADL either performed or assessed with clinical judgement   ADL Overall ADL's : Needs  assistance/impaired                                       General ADL Comments: Limited eval as when pt sat EOB pts HR went to 151. RN came in room.  REturned pt to supine.                  Pertinent Vitals/Pain Pain Assessment: Faces Pain Location: ribs, back (pt reports "locking up") Pain Descriptors / Indicators: Pressure Pain Intervention(s): Limited activity within patient's tolerance;Monitored during session;Repositioned     Hand Dominance     Extremity/Trunk Assessment Upper Extremity Assessment Upper Extremity Assessment: Generalized weakness           Communication Communication Communication: No difficulties   Cognition Arousal/Alertness: Awake/alert Behavior During Therapy: WFL for tasks assessed/performed Overall Cognitive Status: Within Functional Limits for tasks assessed                                                Home Living Family/patient expects to be discharged to:: Private residence  Living Arrangements: Spouse/significant other Available Help at Discharge: Family Type of Home: House Home Access: Stairs to enter Technical brewer of Steps: 5 Entrance Stairs-Rails: Right Home Layout: One level     Bathroom Shower/Tub: Tub/shower unit         Home Equipment: Environmental consultant - 2 wheels   Additional Comments: plan to obtain wedge pillow, pt states hospital bed will not fit in room      Prior Functioning/Environment Level of Independence: Independent                 OT Problem List: Decreased strength;Decreased activity tolerance;Impaired balance (sitting and/or standing)      OT Treatment/Interventions: Self-care/ADL training;DME and/or AE instruction;Patient/family education    OT Goals(Current goals can be found in the care plan section) Acute Rehab OT Goals Patient Stated Goal: walk OT Goal Formulation: With patient Time For Goal Achievement: 12/10/18  OT Frequency: Min 2X/week     AM-PAC OT "6 Clicks" Daily Activity     Outcome Measure Help from another person eating meals?: A Little Help from another person taking care of personal grooming?: A Little Help from another person toileting, which includes using toliet, bedpan, or urinal?: A Little Help from another person bathing (including washing, rinsing, drying)?: A Lot Help from another person to put on and taking off regular upper body clothing?: A Little Help from another person to put on and taking off regular lower body clothing?: A Lot 6 Click Score: 16   End of Session Nurse Communication: Mobility status  Activity Tolerance: Treatment limited secondary to medical complications (Comment)(increased HR upon sitting) Patient left: in bed;with call bell/phone within reach;with family/visitor present  OT Visit Diagnosis: Unsteadiness on feet (R26.81);Other abnormalities of gait and mobility (R26.89);Muscle weakness (generalized) (M62.81)                Time: 4199-1444 OT Time Calculation (min): 22 min Charges:  OT General Charges $OT Visit: 1 Visit OT Evaluation $OT Eval Moderate Complexity: 1 Mod  Kari Baars, OT Acute Rehabilitation Services [phone] Office- [phone], Edwena Felty D 12/03/2018, 2:24 PM

## 2018-12-03 NOTE — Progress Notes (Signed)
MEWS/VS Documentation      12/03/2018 0648 12/03/2018 0700 12/03/2018 0730 12/03/2018 1334   MEWS Score:  1  1  1  2    MEWS Score Color:  Green  Green  Green  Yellow   Resp:  -  -  -  16   Pulse:  (!) 106  -  -  (!) 114   BP:  (!) 145/105  -  -  (!) 154/99   Temp:  -  -  -  100.1 F (37.8 C)   O2 Device:  -  -  -  Room Air   Level of Consciousness:  -  -  Alert  -    MD notified PRN Lopressor given.

## 2018-12-03 NOTE — Progress Notes (Signed)
PROGRESS NOTE    Derek [name]  KDT:[id number] DOB: [birth year] DOA: 11/20/2018 PCP: Posey Boyer, MD    Brief Narrative: 43 y.o.malewithpast medical history significant for back pain for which patient has been seeing orthopedic team. Patient presented with nausea and anorexia. On presentation to the hospital, patient was found to have serumcreatinine of 10.85,BUNof123,WBC count 14.1,total proteinof8.4,calciumof13.6,albumin of4.4andUA showed100 protein. Work-up done was suggestive of multiple myeloma, with CT scan of the chest with contrast revealing lesions in all imaged bones, expansile and destructive mass lesion in the right third rib, pathologic compression fractures of T12 and T5. Nephrology team and oncology team assisting with patient's management. Patient is currently on IV steroids, Velcade and Cytoxan. Renal function has improved significantly. Serum creatinine is down to3.16. Patient has just undergone skeletal survey, and the result is pending. Oncology team is directing care.   Assessment & Plan:   Principal Problem:   ARF (acute renal failure) (HCC) Active Problems:   Low back pain   Hypercalcemia   Acute renal failure (ARF) ([place])   AKI (acute kidney injury) ([place])   Multiple myeloma ([place])   #1 multiple myeloma followed by oncology treated with Cytoxan and Velcade.  Patient and his significant other refused to take him home or be discharged reporting he is weak in his hands and legs.  Patient was seen by PT and OT patient walked around in the room yesterday and had recommended home health PT and OT.  Patient himself never complained of this to me in the past.  He was always moving his extremities.  Patient was ready to go home and was okay to go home when I saw him this morning.  Later on his significant other came in and stated that he does not have any strength in his hands or legs.  I have reconsulted PT and OT.    #2 AKI with  hyperkalemiasecondary to myelomaimproving status post Kayexalate 12/02/2018  #3 T12-L1 compression fracture patient reports having a lot of pain and not being able to walk. Abnormal bone density on CT scan.?Pathologic fracture. Continue fentanyl patch and oxycodone.  DC Dilaudid.  #4 hyponatremiaresolved  #5 hypercalcemiaresolvedCalcium 9.2 11/26/2018. Status post pamidronate and calcitonin. 11/22/2018.  #6 right anterior chest wall pain secondary to multiple myeloma CT of the chest 11/24/2018 with diffuse lytic lesions and destructive mass involving the right third rib pathology compression fractures at T12 and T5. Bone survey 11/26/2018 shows osseous destruction of the right third rib compression fractures at T5 L1-L2 and L4  #7 new onset tachycardiaresolved  VTE Prophylaxis: SCDpatient refuses Lovenox Code Status:Full code Family Communication:None  Disposition Plan: Plan is to discharge him home on Monday, 12/04/2018.  Patient and his significant other does not have all the arrangements and equipments needed for safe transfer to home till tomorrow.   Consultants nephrology and oncology   Estimated body mass index is 29.18 kg/m as calculated from the following:   Height as of this encounter: 6' (1.829 m).   Weight as of this encounter: 97.6 kg.   Subjective patient reports feeling better pain control with narcotics    Objective: Vitals:   12/02/18 1339 12/02/18 2126 12/03/18 0526 12/03/18 0648  BP: (!) 154/87 (!) 145/98 (!) 161/110 (!) 145/105  Pulse: 90 91 (!) 104 (!) 106  Resp: _0 Temp: (!) 97.5 F (36.4 C) 97.7 F (36.5 C) 99.5 F (37.5 C)   TempSrc: Oral Oral Oral   SpO2: 99% 99% 100%   Weight:  Height:        Intake/Output Summary (Last 24 hours) at 12/03/2018 1220 Last data filed at 12/03/2018 1130 Gross per 24 hour  Intake 480 ml  Output 3725 ml  Net -3245 ml   Filed Weights   11/28/18 0500 11/29/18 0500 11/30/18 0737   Weight: 98.2 kg 100.9 kg 97.6 kg    Examination:  General exam: Appears calm and comfortable  Respiratory system: Clear to auscultation. Respiratory effort normal.  Tender right lower chest upper abdomen Cardiovascular system: S1 & S2 heard, RRR. No JVD, murmurs, rubs, gallops or clicks. No pedal edema. Gastrointestinal system: Abdomen is nondistended, soft and nontender. No organomegaly or masses felt. Normal bowel sounds heard. Central nervous system: Alert and oriented. No focal neurological deficits. Extremities: Symmetric 5 x 5 power. Skin: No rashes, lesions or ulcers Psychiatry: Judgement and insight appear normal. Mood & affect appropriate.     Data Reviewed: I have personally reviewed following labs and imaging studies  CBC: Recent Labs  Lab 11/28/18 0751 11/29/18 0352 11/30/18 0437 12/01/18 0422 12/02/18 0704  WBC 9.6 7.3 6.9 6.0 10.4  NEUTROABS  --   --   --  3.9  --   HGB 8.9* 9.9* 10.4* 10.5* 10.2*  HCT 27.0* 30.8* 32.8* 32.7* 30.8*  MCV 85.7 86.0 87.7 86.1 85.1  PLT 313 276 253 255 409   Basic Metabolic Panel: Recent Labs  Lab 11/29/18 0352 11/30/18 0437 12/01/18 0422 12/02/18 0704 12/02/18 1437  NA 136 137 135 136 137  K 4.6 4.6 4.8 5.5* 4.6  CL 100 102 103 104 105  CO2 _0 21* 20*  GLUCOSE 94 110* 104* 112* 88  BUN 48* 38* 33* 38* 35*  CREATININE 3.47* 3.16* 3.09* 2.77* 2.61*  CALCIUM 8.7* 8.2* 8.5* 8.9 9.2  PHOS 2.9 4.3 3.3  --   --    GFR: Estimated Creatinine Clearance: 43.7 mL/min (A) (by C-G formula based on SCr of 2.61 mg/dL (H)). Liver Function Tests: Recent Labs  Lab 11/29/18 0352 11/30/18 0437 12/01/18 0422  AST  --   --  24  ALT  --   --  79*  ALKPHOS  --   --  225*  BILITOT  --   --  0.5  PROT  --   --  6.5  ALBUMIN 3.2* 3.2* 3.3*   No results for input(s): LIPASE, AMYLASE in the last 168 hours. No results for input(s): AMMONIA in the last 168 hours. Coagulation Profile: Recent Labs  Lab 11/28/18 0402  INR 1.1    Cardiac Enzymes: No results for input(s): CKTOTAL, CKMB, CKMBINDEX, TROPONINI in the last 168 hours. BNP (last 3 results) No results for input(s): PROBNP in the last 8760 hours. HbA1C: No results for input(s): HGBA1C in the last 72 hours. CBG: Recent Labs  Lab 12/02/18 1133 12/02/18 1651 12/02/18 2124 12/03/18 0730 12/03/18 1146  GLUCAP 131* 111* 186* 107* 106*   Lipid Profile: No results for input(s): CHOL, HDL, LDLCALC, TRIG, CHOLHDL, LDLDIRECT in the last 72 hours. Thyroid Function Tests: No results for input(s): TSH, T4TOTAL, FREET4, T3FREE, THYROIDAB in the last 72 hours. Anemia Panel: No results for input(s): VITAMINB12, FOLATE, FERRITIN, TIBC, IRON, RETICCTPCT in the last 72 hours. Sepsis Labs: No results for input(s): PROCALCITON, LATICACIDVEN in the last 168 hours.  Recent Results (from the past 240 hour(s))  Culture, Urine     Status: Abnormal   Collection Time: 11/24/18  7:22 AM   Specimen: Urine, Clean Catch  Result Value  Ref Range Status   Specimen Description   Final    URINE, CLEAN CATCH Performed at [facility], [place] [address], [place], [place] [address]    Special Requests   Final    NONE Performed at [facility], [place] [address], [place], [place] [address]    Culture (A)  Final    60,000 COLONIES/mL MULTIPLE SPECIES PRESENT, SUGGEST RECOLLECTION   Report Status 11/25/2018 FINAL  Final  Culture, blood (routine x 2)     Status: None   Collection Time: 11/24/18  7:57 AM   Specimen: BLOOD LEFT HAND  Result Value Ref Range Status   Specimen Description   Final    BLOOD LEFT HAND Performed at [facility] [address] [place], [place] [address]    Special Requests   Final    BOTTLES DRAWN AEROBIC AND ANAEROBIC Blood Culture adequate volume Performed at [facility] [address] [place], [place] [address]    Culture   Final    NO GROWTH 5 DAYS Performed at [facility], [place] [address] [place], [place] [address]    Report Status 11/29/2018 FINAL  Final  Culture, blood (routine x 2)     Status: None   Collection Time: 11/24/18  7:58 AM   Specimen: BLOOD RIGHT HAND  Result Value Ref Range Status   Specimen Description   Final    BLOOD RIGHT HAND Performed at [facility] [address] [place], [place] [address]    Special Requests   Final    BOTTLES DRAWN AEROBIC AND ANAEROBIC Blood Culture adequate volume Performed at [facility] [address] [place], [place] [address]    Culture   Final    NO GROWTH 5 DAYS Performed at [facility], [place] [address] [place], [place] [address]    Report Status 11/29/2018 FINAL  Final         Radiology Studies: No results found.      Scheduled Meds: . docusate sodium  200 mg Oral BID  . enoxaparin (LOVENOX) injection  50 mg Subcutaneous Q24H  . fentaNYL  1 patch Transdermal Q72H  . lidocaine  1 patch Transdermal Q24H  . ondansetron  4 mg Oral Once   Continuous Infusions:   LOS: 12 days     Georgette Shell, MD [facility]  If 7PM-7AM, please contact night-coverage [email or web address] Password [facility] 12/03/2018, 12:20 PM

## 2018-12-04 ENCOUNTER — Inpatient Hospital Stay: Payer: BC Managed Care – PPO

## 2018-12-04 ENCOUNTER — Encounter (HOSPITAL_COMMUNITY): Payer: Self-pay | Admitting: Internal Medicine

## 2018-12-04 ENCOUNTER — Telehealth: Payer: Self-pay | Admitting: Family Medicine

## 2018-12-04 ENCOUNTER — Inpatient Hospital Stay: Payer: BC Managed Care – PPO | Admitting: Nurse Practitioner

## 2018-12-04 ENCOUNTER — Telehealth: Payer: Self-pay | Admitting: Oncology

## 2018-12-04 LAB — CREATININE, SERUM
Creatinine, Ser: 2.61 mg/dL — ABNORMAL HIGH (ref 0.61–1.24)
GFR calc Af Amer: 33 mL/min — ABNORMAL LOW (ref >60)
GFR calc non Af Amer: 29 mL/min — ABNORMAL LOW (ref >60)

## 2018-12-04 LAB — URINE CULTURE
Culture: NO GROWTH
Special Requests: NORMAL

## 2018-12-04 LAB — GLUCOSE, CAPILLARY
Glucose-Capillary: 95 mg/dL (ref 70–99)
Glucose-Capillary: 123 mg/dL — ABNORMAL HIGH (ref 70–99)

## 2018-12-04 MED ORDER — METOPROLOL TARTRATE 25 MG PO TABS: 25.0000 mg | ORAL_TABLET | Freq: Two times a day (BID) | ORAL | 1 refills | Status: DC

## 2018-12-04 NOTE — Telephone Encounter (Signed)
Scheduled appt per 9/14 sch message - unable to reach pt ,left message with appt date and time
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
## 2018-12-04 NOTE — Progress Notes (Signed)
Occupational Therapy Treatment Patient Details Name: Derek [name] MRN: [id number] DOB: 1975-05-16 Today's Date: 12/04/2018    History of present illness Pt is a 43 year old male admitted for back pain and work up suggestive of multiple myeloma. CT of the chest 11/24/2018 with diffuse lytic lesions and destructive mass involving the right third rib pathology compression fractures at T12 and T5. Bone survey 11/26/2018 shows osseous destruction of the right third rib compression fractures at T5 L1-L2 and L4   OT comments  Plan is to DC home this day! Pt is pleased. Pt has or will obtain all needed DME. Wife will A as needed  Follow Up Recommendations  Home health OT;Supervision/Assistance - 24 hour    Equipment Recommendations  3 in 1 bedside commode           Mobility Bed Mobility                  Transfers Overall transfer level: Needs assistance Equipment used: Rolling walker (2 wheeled) Transfers: Sit to/from Stand;Stand Pivot Transfers Sit to Stand: Min assist Stand pivot transfers: Min assist               ADL either performed or assessed with clinical judgement   ADL Overall ADL's : Needs assistance/impaired                                       General ADL Comments: OT educated pt and wife in use of tub bench for home and how to use.  Educated on use of reacher as well for pt to obtain needed items. Wife will A pt as needed. Pt continues to complain of pressure rather than pain. Pt in better spirits this day and excited about going home.     Vision Patient Visual Report: No change from baseline            Cognition Arousal/Alertness: Awake/alert Behavior During Therapy: WFL for tasks assessed/performed Overall Cognitive Status: Within Functional Limits for tasks assessed                                                General Comments  pt has just returned from the bathroom with wife    Pertinent Vitals/  Pain       Pain Assessment: Faces Pain Location: back Pain Descriptors / Indicators: Pressure Pain Intervention(s): Monitored during session         Frequency  Min 2X/week        Progress Toward Goals  OT Goals(current goals can now be found in the care plan section)  Progress towards OT goals: Progressing toward goals     Plan Discharge plan remains appropriate       AM-PAC OT "6 Clicks" Daily Activity     Outcome Measure   Help from another person eating meals?: A Little Help from another person taking care of personal grooming?: A Little Help from another person toileting, which includes using toliet, bedpan, or urinal?: A Little Help from another person bathing (including washing, rinsing, drying)?: A Lot Help from another person to put on and taking off regular upper body clothing?: A Little Help from another person to put on and taking off regular lower body clothing?: A Lot 6 Click Score: 16  End of Session Equipment Utilized During Treatment: Rolling walker  OT Visit Diagnosis: Unsteadiness on feet (R26.81);Other abnormalities of gait and mobility (R26.89);Muscle weakness (generalized) (M62.81)   Activity Tolerance Patient tolerated treatment well(increased HR upon sitting)   Patient Left in bed;with call bell/phone within reach;with family/visitor present   Nurse Communication Mobility status        Time: 0214-0229 OT Time Calculation (min): 15 min  Charges: OT General Charges $OT Visit: 1 Visit OT Treatments $Self Care/Home Management : 8-22 mins  Kari Baars, [place] [phone] Office- [phone]      [place], Edwena Felty D 12/04/2018, 6:40 PM

## 2018-12-04 NOTE — Progress Notes (Signed)
PROGRESS NOTE    Derek [name]  OEU:[id number] DOB: [birth year] DOA: 11/20/2018 PCP: Posey Boyer, MD  Brief Narrative:  43 y.o.malewithpast medical history significant for back pain for which patient has been seeing orthopedic team. Patient presented with nausea and anorexia. On presentation to the hospital, patient was found to have serumcreatinine of 10.85,BUNof123,WBC count 14.1,total proteinof8.4,calciumof13.6,albumin of4.4andUA showed100 protein. Work-up done was suggestive of multiple myeloma, with CT scan of the chest with contrast revealing lesions in all imaged bones, expansile and destructive mass lesion in the right third rib, pathologic compression fractures of T12 and T5. Nephrology team and oncology team assisting with patient's management. Patient is currently on IV steroids, Velcade and Cytoxan. Renal function has improved significantly. Serum creatinine is down to3.16. Patient has just undergone skeletal survey, and the result is pending. Oncology team is directing care.  Assessment & Plan:   Principal Problem:   ARF (acute renal failure) (HCC) Active Problems:   Low back pain   Hypercalcemia   Acute renal failure (ARF) ([place])   AKI (acute kidney injury) ([place])   Multiple myeloma ([place])     #1 newly diagnosed multiple myeloma followed by oncologytreated with Cytoxan and Velcade.  Patient to get his next treatment tomorrow 12/05/2018 as an outpatient with Dr. Benay Spice.  Patient will be discharged home today with home health PT and OT.  Overnight patient spiked fever of unknown source.  So far work-up includes chest x-ray blood culture urine culture has been negative with normal white count.  And patient has no symptoms and actually feels stronger and better and is anxious and asking to go home.   #2 AKI secondary to myeloma improving.  Follow-up with nephrology.    #3 T12-L1 compression fracture continue pain control with fentanyl patch and  oxycodone.    #4 hyponatremiaresolved  #5 hypercalcemiaresolvedCalcium 9.2 11/26/2018. Status post pamidronate and calcitonin. 11/22/2018.  #6 right anterior chest wall pain secondary to multiple myeloma CT of the chest 11/24/2018 with diffuse lytic lesions and destructive mass involving the right third rib pathology compression fractures at T12 and T5. Bone survey 11/26/2018 shows osseous destruction of the right third rib compression fractures at T5 L1-L2 and L4   Estimated body mass index is 29.18 kg/m as calculated from the following:   Height as of this encounter: 6' (1.829 m).   Weight as of this encounter: 97.6 kg.     Subjective:  Patient reports he feels the best that he has ever felt during this hospital stay requesting to go home no nausea vomiting diarrhea  shortness of breath or cough.  Objective: Vitals:   12/04/18 0012 12/04/18 0126 12/04/18 0508 12/04/18 1200  BP:  (!) 127/93 (!) 130/99   Pulse:  100 90   Resp:  16 19   Temp: (!) 101.8 F (38.8 C) 99.9 F (37.7 C) 98.8 F (37.1 C) 98.8 F (37.1 C)  TempSrc: Oral Oral Oral Oral  SpO2:  98% 96%   Weight:      Height:        Intake/Output Summary (Last 24 hours) at 12/04/2018 1240 Last data filed at 12/04/2018 0800 Gross per 24 hour  Intake 1062.76 ml  Output 3950 ml  Net -2887.24 ml   Filed Weights   11/28/18 0500 11/29/18 0500 11/30/18 0737  Weight: 98.2 kg 100.9 kg 97.6 kg    Examination:  General exam: Appears calm and comfortable  Respiratory system: Clear to auscultation. Respiratory effort normal.  Tender right upper and lower anterior  chest and upper abdomen noted Cardiovascular system: S1 & S2 heard, RRR. No JVD, murmurs, rubs, gallops or clicks. No pedal edema. Gastrointestinal system: Abdomen is nondistended, soft and nontender. No organomegaly or masses felt. Normal bowel sounds heard. Central nervous system: Alert and oriented. No focal neurological deficits. Extremities: Symmetric 5  x 5 power. Skin: No rashes, lesions or ulcers Psychiatry: Judgement and insight appear normal. Mood & affect appropriate.     Data Reviewed: I have personally reviewed following labs and imaging studies  CBC: Recent Labs  Lab 11/29/18 0352 11/30/18 0437 12/01/18 0422 12/02/18 0704 12/03/18 1554  WBC 7.3 6.9 6.0 10.4 8.2  NEUTROABS  --   --  3.9  --   --   HGB 9.9* 10.4* 10.5* 10.2* 13.4  HCT 30.8* 32.8* 32.7* 30.8* 40.8  MCV 86.0 87.7 86.1 85.1 84.5  PLT 276 253 255 230 790   Basic Metabolic Panel: Recent Labs  Lab 11/29/18 0352 11/30/18 0437 12/01/18 0422 12/02/18 0704 12/02/18 1437 12/03/18 1554 12/04/18 0537  NA 136 137 135 136 137 137  --   K 4.6 4.6 4.8 5.5* 4.6 5.1  --   CL 100 102 103 104 105 102  --   CO2 _0 21* 20* 23  --   GLUCOSE 94 110* 104* 112* 88 102*  --   BUN 48* 38* 33* 38* 35* 32*  --   CREATININE 3.47* 3.16* 3.09* 2.77* 2.61* 2.81* 2.61*  CALCIUM 8.7* 8.2* 8.5* 8.9 9.2 8.9  --   PHOS 2.9 4.3 3.3  --   --   --   --    GFR: Estimated Creatinine Clearance: 43.7 mL/min (A) (by C-G formula based on SCr of 2.61 mg/dL (H)). Liver Function Tests: Recent Labs  Lab 11/29/18 0352 11/30/18 0437 12/01/18 0422 12/03/18 1554  AST  --   --  24 49*  ALT  --   --  79* 130*  ALKPHOS  --   --  225* 239*  BILITOT  --   --  0.5 0.4  PROT  --   --  6.5 7.1  ALBUMIN 3.2* 3.2* 3.3* 3.4*   No results for input(s): LIPASE, AMYLASE in the last 168 hours. No results for input(s): AMMONIA in the last 168 hours. Coagulation Profile: Recent Labs  Lab 11/28/18 0402  INR 1.1   Cardiac Enzymes: No results for input(s): CKTOTAL, CKMB, CKMBINDEX, TROPONINI in the last 168 hours. BNP (last 3 results) No results for input(s): PROBNP in the last 8760 hours. HbA1C: No results for input(s): HGBA1C in the last 72 hours. CBG: Recent Labs  Lab 12/03/18 0730 12/03/18 1146 12/03/18 2206 12/04/18 0801 12/04/18 1152  GLUCAP 107* 106* 128* 95 123*   Lipid  Profile: No results for input(s): CHOL, HDL, LDLCALC, TRIG, CHOLHDL, LDLDIRECT in the last 72 hours. Thyroid Function Tests: No results for input(s): TSH, T4TOTAL, FREET4, T3FREE, THYROIDAB in the last 72 hours. Anemia Panel: No results for input(s): VITAMINB12, FOLATE, FERRITIN, TIBC, IRON, RETICCTPCT in the last 72 hours. Sepsis Labs: No results for input(s): PROCALCITON, LATICACIDVEN in the last 168 hours.  No results found for this or any previous visit (from the past 240 hour(s)).       Radiology Studies: Dg Chest Port 1 View  Result Date: 12/03/2018 CLINICAL DATA:  Fever.  Multiple myeloma. EXAM: PORTABLE CHEST 1 VIEW COMPARISON:  11/24/2018 FINDINGS: 1620 hours. The lungs are clear without focal pneumonia, edema, pneumothorax or pleural effusion. The cardio pericardial silhouette  is enlarged. There is pulmonary vascular congestion without overt pulmonary edema. Similar appearance right chest wall mass with destruction of the right second and third ribs. Telemetry leads overlie the chest. IMPRESSION: Cardiomegaly with vascular congestion. Similar appearance right chest wall mass lesion. Electronically Signed   By: Misty Stanley M.D.   On: 12/03/2018 18:13        Scheduled Meds: . docusate sodium  200 mg Oral BID  . enoxaparin (LOVENOX) injection  50 mg Subcutaneous Q24H  . fentaNYL  1 patch Transdermal Q72H  . lidocaine  1 patch Transdermal Q24H  . metoprolol tartrate  25 mg Oral BID  . ondansetron  4 mg Oral Once   Continuous Infusions: . sodium chloride 100 mL/hr at 12/04/18 0458     LOS: 13 days     Georgette Shell, MD [facility]  If 7PM-7AM, please contact night-coverage [email or web address] Password [facility] 12/04/2018, 12:40 PM

## 2018-12-04 NOTE — Telephone Encounter (Signed)
Caller/Agency: [facility] Kay Number: [phone]  Requesting OT/PT/Skilled Nursing/Social Work/Speech Therapy: PT Frequency: Patient was discharged from [facility] on 12/02/2018 wanting verbal that Dr. Nira Retort will follow the patient.

## 2018-12-05 ENCOUNTER — Inpatient Hospital Stay: Payer: BC Managed Care – PPO

## 2018-12-05 ENCOUNTER — Inpatient Hospital Stay (HOSPITAL_BASED_OUTPATIENT_CLINIC_OR_DEPARTMENT_OTHER): Payer: BC Managed Care – PPO | Admitting: Nurse Practitioner

## 2018-12-05 ENCOUNTER — Telehealth: Payer: Self-pay | Admitting: *Deleted

## 2018-12-05 ENCOUNTER — Other Ambulatory Visit: Payer: Self-pay

## 2018-12-05 ENCOUNTER — Encounter: Payer: Self-pay | Admitting: Nurse Practitioner

## 2018-12-05 ENCOUNTER — Telehealth: Payer: Self-pay | Admitting: Family Medicine

## 2018-12-05 ENCOUNTER — Inpatient Hospital Stay: Payer: BC Managed Care – PPO | Attending: Oncology

## 2018-12-05 VITALS — BP 126/100 | HR 100 | Temp 98.2°F | Resp 19 | Ht 72.0 in

## 2018-12-05 VITALS — BP 143/98 | HR 95

## 2018-12-05 DIAGNOSIS — D649 Anemia, unspecified: Secondary | ICD-10-CM | POA: Diagnosis not present

## 2018-12-05 DIAGNOSIS — Z86718 Personal history of other venous thrombosis and embolism: Secondary | ICD-10-CM | POA: Diagnosis not present

## 2018-12-05 DIAGNOSIS — R740 Nonspecific elevation of levels of transaminase and lactic acid dehydrogenase [LDH]: Secondary | ICD-10-CM | POA: Insufficient documentation

## 2018-12-05 DIAGNOSIS — G479 Sleep disorder, unspecified: Secondary | ICD-10-CM | POA: Insufficient documentation

## 2018-12-05 DIAGNOSIS — Z221 Carrier of other intestinal infectious diseases: Secondary | ICD-10-CM | POA: Diagnosis not present

## 2018-12-05 DIAGNOSIS — C9000 Multiple myeloma not having achieved remission: Secondary | ICD-10-CM

## 2018-12-05 DIAGNOSIS — Z79899 Other long term (current) drug therapy: Secondary | ICD-10-CM | POA: Insufficient documentation

## 2018-12-05 DIAGNOSIS — Z5112 Encounter for antineoplastic immunotherapy: Secondary | ICD-10-CM | POA: Insufficient documentation

## 2018-12-05 LAB — CMP (CANCER CENTER ONLY)
ALT: 169 U/L — ABNORMAL HIGH (ref 0–44)
AST: 53 U/L — ABNORMAL HIGH (ref 15–41)
Albumin: 3.4 g/dL — ABNORMAL LOW (ref 3.5–5.0)
Alkaline Phosphatase: 262 U/L — ABNORMAL HIGH (ref 38–126)
Anion gap: 10 (ref 5–15)
BUN: 25 mg/dL — ABNORMAL HIGH (ref 6–20)
CO2: 21 mmol/L — ABNORMAL LOW (ref 22–32)
Calcium: 8.6 mg/dL — ABNORMAL LOW (ref 8.9–10.3)
Chloride: 106 mmol/L (ref 98–111)
Creatinine: 2.35 mg/dL — ABNORMAL HIGH (ref 0.61–1.24)
GFR, Est AFR Am: 38 mL/min — ABNORMAL LOW (ref >60)
GFR, Estimated: 32 mL/min — ABNORMAL LOW (ref >60)
Glucose, Bld: 117 mg/dL — ABNORMAL HIGH (ref 70–99)
Potassium: 5 mmol/L (ref 3.5–5.1)
Sodium: 137 mmol/L (ref 135–145)
Total Bilirubin: 0.4 mg/dL (ref 0.3–1.2)
Total Protein: 7.3 g/dL (ref 6.5–8.1)

## 2018-12-05 LAB — CBC WITH DIFFERENTIAL (CANCER CENTER ONLY)
Abs Immature Granulocytes: 0.17 10*3/uL — ABNORMAL HIGH (ref 0.00–0.07)
Basophils Absolute: 0.1 10*3/uL (ref 0.0–0.1)
Basophils Relative: 1 %
Eosinophils Absolute: 0.2 10*3/uL (ref 0.0–0.5)
Eosinophils Relative: 2 %
HCT: 35.4 % — ABNORMAL LOW (ref 39.0–52.0)
Hemoglobin: 11.6 g/dL — ABNORMAL LOW (ref 13.0–17.0)
Immature Granulocytes: 2 %
Lymphocytes Relative: 18 %
Lymphs Abs: 1.9 10*3/uL (ref 0.7–4.0)
MCH: 28 pg (ref 26.0–34.0)
MCHC: 32.8 g/dL (ref 30.0–36.0)
MCV: 85.3 fL (ref 80.0–100.0)
Monocytes Absolute: 0.6 10*3/uL (ref 0.1–1.0)
Monocytes Relative: 6 %
Neutro Abs: 7.6 10*3/uL (ref 1.7–7.7)
Neutrophils Relative %: 71 %
Platelet Count: 289 10*3/uL (ref 150–400)
RBC: 4.15 MIL/uL — ABNORMAL LOW (ref 4.22–5.81)
RDW: 13.8 % (ref 11.5–15.5)
WBC Count: 10.5 10*3/uL (ref 4.0–10.5)
nRBC: 0 % (ref 0.0–0.2)

## 2018-12-05 MED ORDER — PROCHLORPERAZINE MALEATE 10 MG PO TABS
ORAL_TABLET | ORAL | Status: AC
  Filled 2018-12-05: qty 1

## 2018-12-05 MED ORDER — PROCHLORPERAZINE MALEATE 10 MG PO TABS
10.0000 mg | ORAL_TABLET | Freq: Once | ORAL | Status: AC
  Administered 2018-12-05: 10 mg via ORAL

## 2018-12-05 MED ORDER — BORTEZOMIB CHEMO SQ INJECTION 3.5 MG (2.5MG/ML)
1.3000 mg/m2 | Freq: Once | INTRAMUSCULAR | Status: AC
  Administered 2018-12-05: 2.75 mg via SUBCUTANEOUS
  Filled 2018-12-05: qty 1.1

## 2018-12-05 MED FILL — CYCLOPHOSPHAMIDE 50 MG CAPS: 50 | 30 days supply | Qty: 6 | Fill #0

## 2018-12-05 NOTE — Telephone Encounter (Signed)
Received vm message from pt's significant other, Deidre. She is requesting to verify pt's appt times for today. Reviewed appt times with her. She states she may be a little late (1-15 minutes)as she teaches. Notified nurse with [name], NP.
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
## 2018-12-05 NOTE — Telephone Encounter (Signed)
Need verbal orders from Dr. Linna Darner / states patient was released from hospital with orders for [facility]

## 2018-12-05 NOTE — Telephone Encounter (Signed)
Oral Oncology Patient Advocate Encounter  I called Derek [name] to discuss medication acquisition and had to leave a voicemail.  [name] Phone [phone] Fax [phone] 12/05/2018   11:35 AM

## 2018-12-05 NOTE — Progress Notes (Signed)
Per [name], NP okay to treat with ALT 169 and creatinine 2.35.

## 2018-12-05 NOTE — Patient Instructions (Addendum)
[facility] Discharge Instructions for Patients Receiving Chemotherapy  Today you received the following chemotherapy agents: Velcade.  To help prevent nausea and vomiting after your treatment, we encourage you to take your nausea medication as directed.   If you develop nausea and vomiting that is not controlled by your nausea medication, call the clinic.   BELOW ARE SYMPTOMS THAT SHOULD BE REPORTED IMMEDIATELY:  *FEVER GREATER THAN 100.5 F  *CHILLS WITH OR WITHOUT FEVER  NAUSEA AND VOMITING THAT IS NOT CONTROLLED WITH YOUR NAUSEA MEDICATION  *UNUSUAL SHORTNESS OF BREATH  *UNUSUAL BRUISING OR BLEEDING  TENDERNESS IN MOUTH AND THROAT WITH OR WITHOUT PRESENCE OF ULCERS  *URINARY PROBLEMS  *BOWEL PROBLEMS  UNUSUAL RASH Items with * indicate a potential emergency and should be followed up as soon as possible.  Feel free to call the clinic should you have any questions or concerns. The clinic phone number is [phone].  Please show the [facility] at check-in to the Emergency Department and triage nurse.  Bortezomib injection What is this medicine? BORTEZOMIB (bor TEZ oh mib) is a medicine that targets proteins in cancer cells and stops the cancer cells from growing. It is used to treat multiple myeloma and mantle-cell lymphoma. This medicine may be used for other purposes; ask your health care provider or pharmacist if you have questions. COMMON BRAND NAME(S): Velcade What should I tell my health care provider before I take this medicine? They need to know if you have any of these conditions:  diabetes  heart disease  irregular heartbeat  liver disease  on hemodialysis  low blood counts, like low white blood cells, platelets, or hemoglobin  peripheral neuropathy  taking medicine for blood pressure  an unusual or allergic reaction to bortezomib, mannitol, boron, other medicines, foods, dyes, or preservatives  pregnant or trying to get  pregnant  breast-feeding How should I use this medicine? This medicine is for injection into a vein or for injection under the skin. It is given by a health care professional in a hospital or clinic setting. Talk to your pediatrician regarding the use of this medicine in children. Special care may be needed. Overdosage: If you think you have taken too much of this medicine contact a poison control center or emergency room at once. NOTE: This medicine is only for you. Do not share this medicine with others. What if I miss a dose? It is important not to miss your dose. Call your doctor or health care professional if you are unable to keep an appointment. What may interact with this medicine? This medicine may interact with the following medications:  ketoconazole  rifampin  ritonavir  St. John's Wort This list may not describe all possible interactions. Give your health care provider a list of all the medicines, herbs, non-prescription drugs, or dietary supplements you use. Also tell them if you smoke, drink alcohol, or use illegal drugs. Some items may interact with your medicine. What should I watch for while using this medicine? You may get drowsy or dizzy. Do not drive, use machinery, or do anything that needs mental alertness until you know how this medicine affects you. Do not stand or sit up quickly, especially if you are an older patient. This reduces the risk of dizzy or fainting spells. In some cases, you may be given additional medicines to help with side effects. Follow all directions for their use. Call your doctor or health care professional for advice if you get a fever,  chills or sore throat, or other symptoms of a cold or flu. Do not treat yourself. This drug decreases your body's ability to fight infections. Try to avoid being around people who are sick. This medicine may increase your risk to bruise or bleed. Call your doctor or health care professional if you notice any  unusual bleeding. You may need blood work done while you are taking this medicine. In some patients, this medicine may cause a serious brain infection that may cause death. If you have any problems seeing, thinking, speaking, walking, or standing, tell your doctor right away. If you cannot reach your doctor, urgently seek other source of medical care. Check with your doctor or health care professional if you get an attack of severe diarrhea, nausea and vomiting, or if you sweat a lot. The loss of too much body fluid can make it dangerous for you to take this medicine. Do not become pregnant while taking this medicine or for at least 7 months after stopping it. Women should inform their doctor if they wish to become pregnant or think they might be pregnant. Men should not father a child while taking this medicine and for at least 4 months after stopping it. There is a potential for serious side effects to an unborn child. Talk to your health care professional or pharmacist for more information. Do not breast-feed an infant while taking this medicine or for 2 months after stopping it. This medicine may interfere with the ability to have a child. You should talk with your doctor or health care professional if you are concerned about your fertility. What side effects may I notice from receiving this medicine? Side effects that you should report to your doctor or health care professional as soon as possible:  allergic reactions like skin rash, itching or hives, swelling of the face, lips, or tongue  breathing problems  changes in hearing  changes in vision  fast, irregular heartbeat  feeling faint or lightheaded, falls  pain, tingling, numbness in the hands or feet  right upper belly pain  seizures  swelling of the ankles, feet, hands  unusual bleeding or bruising  unusually weak or tired  vomiting  yellowing of the eyes or skin Side effects that usually do not require medical  attention (report to your doctor or health care professional if they continue or are bothersome):  changes in emotions or moods  constipation  diarrhea  loss of appetite  headache  irritation at site where injected  nausea This list may not describe all possible side effects. Call your doctor for medical advice about side effects. You may report side effects to FDA at 1-800-FDA-1088. Where should I keep my medicine? This drug is given in a hospital or clinic and will not be stored at home. NOTE: This sheet is a summary. It may not cover all possible information. If you have questions about this medicine, talk to your doctor, pharmacist, or health care provider.  2020 Elsevier/Gold Standard (2017-07-18 16:29:31)

## 2018-12-06 ENCOUNTER — Telehealth: Payer: Self-pay | Admitting: *Deleted

## 2018-12-06 ENCOUNTER — Telehealth: Payer: Self-pay | Admitting: Oncology

## 2018-12-06 MED ORDER — ONDANSETRON 8 MG PO TBDP: 8.0000 mg | ORAL_TABLET | Freq: Three times a day (TID) | ORAL | 1 refills | Status: DC | PRN

## 2018-12-06 NOTE — Telephone Encounter (Signed)
Oral Oncology Patient Advocate Encounter  I spoke to Sherrard yesterday and arranged with her for the Cyclophosphamide to be picked up 9/15 after the patients appointment.  Confirmed with Lena that Cyclophosphamide was picked up on 9/15 with a $42.94 copay.   [place] Patient [place] Phone [phone] Fax [phone] 12/06/2018   10:47 AM

## 2018-12-06 NOTE — Telephone Encounter (Signed)
Received call from "Kendrick Ranch [phone]) for [employer].  Has his order been sent?  Is [name] available to speak with me?"    Informed Zofran ODT order has been sent and call transferred A.P.P collaborative.

## 2018-12-06 NOTE — Telephone Encounter (Signed)
Left message re 9/21 appointments. Schedule mailed.

## 2018-12-06 NOTE — Telephone Encounter (Signed)
"  [facility] manager [phone]).  Received orders for physical therapy from Hospitalist for [facility] seen yesterday by [name].  Need a provider to sign orders.  Patient waiting three days to begin therapy.  Return call to let us know if Dr. Benay Spice will sign PT orders."

## 2018-12-06 NOTE — Telephone Encounter (Signed)
"  Kendrick Ranch [phone]).  Requesting order for dissolvable Ondansetron instead of tablets.  Discovered in the hospital these worked better for him."

## 2018-12-06 NOTE — Telephone Encounter (Signed)
Asking for script for ODT ondansetron. This helps him better than the tablet he needs to swallow. Escribed as requested.

## 2018-12-07 ENCOUNTER — Telehealth: Payer: Self-pay | Admitting: *Deleted

## 2018-12-07 ENCOUNTER — Other Ambulatory Visit: Payer: Self-pay | Admitting: Oncology

## 2018-12-07 ENCOUNTER — Encounter (HOSPITAL_COMMUNITY): Payer: Self-pay | Admitting: Internal Medicine

## 2018-12-07 MED ORDER — HYDROMORPHONE HCL 4 MG PO TABS: 4.0000 mg | ORAL_TABLET | ORAL | 0 refills | Status: DC | PRN

## 2018-12-07 NOTE — Telephone Encounter (Addendum)
Called pharmacy to f/u on PA requirement per triage call. She reports just speaking with someone in office and she provided them all the information needed to complete the PA with insurance provider. Called significant other, Dedra and informed her that Dr. Benay Spice is ordering Dilaudid 4 mg every 4 hours prn instead of the oxycodone and Duragesic. Dr. Benay Spice prefers to hold off on the duragesic patch at this time. Requested she ask him to reconsider having the kyphoplasty as discussed w/MD because this will greatly help his pain.  Notified managed care to hold off on PA at this time.

## 2018-12-07 NOTE — Telephone Encounter (Signed)
Notified BJ at home health agency that Dr. Benay Spice is willing to sign PT orders for patient.

## 2018-12-07 NOTE — Telephone Encounter (Signed)
"  Kendrick Ranch [phone]).   ABDULWASI Krul pain, back spasms not relieved.  Throughout the day pain is level six to seven.  Excruciating pain at level ten with back spasms.  While in hospital he received Dilaudid, Oxycodone and wore the Fentanyl patch.    1. Fentanyl due to be changed today at 12:00 pm has not yet been authorized. 2. Took two Oxycodone 5 mg at 8:50 am leaving four pills left.     Setting alarm every four hours for next dose or he is unable to move or get in position with back spasms to take next dose.  3. Having to use more Baclofen and had to use a second Xanax to calm him down."

## 2018-12-07 NOTE — Telephone Encounter (Signed)
[facility] denies request for faxed Fentanyl prior authorization information.   "Call [phone], [facility].   Patient's ID number is [id number]. Prior Authorization request sent to Dr. Coralee Pesa pending. Insurance did not provide a reason or need for authorization."  Voicemail: "Next Oxycodone due at 1:00 pm with one more dose left for the day.  Patch not approved.  Having breakthrough pain.  Reported Tuesday medication not working or not enough.  Doubled oxycodone pill.  Even doubled it's still not working.  Please call [phone]."

## 2018-12-08 ENCOUNTER — Other Ambulatory Visit: Payer: Self-pay | Admitting: *Deleted
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
## 2018-12-08 NOTE — Progress Notes (Signed)
Spoke w/ Dr. Benay Spice via [place] to start C2 early on 9/21 if counts permit. Starting w/ C2, patient will be on a weekly CyBorD schedule (D1, 8, 15 Q21 days). He will receive dexamethasone in infusion w/ his velcade and as a prescription for his cyclophosphamide to take at home.   Demetrius Charity, PharmD, [facility] Pharmacist Pharmacy Phone: [phone] 12/08/2018

## 2018-12-10 ENCOUNTER — Other Ambulatory Visit: Payer: Self-pay | Admitting: Oncology

## 2018-12-11 ENCOUNTER — Inpatient Hospital Stay: Payer: BC Managed Care – PPO

## 2018-12-11 ENCOUNTER — Inpatient Hospital Stay (HOSPITAL_BASED_OUTPATIENT_CLINIC_OR_DEPARTMENT_OTHER): Payer: BC Managed Care – PPO | Admitting: Nurse Practitioner

## 2018-12-11 ENCOUNTER — Other Ambulatory Visit: Payer: Self-pay

## 2018-12-11 VITALS — BP 125/88 | HR 100 | Temp 98.2°F | Resp 18 | Ht 72.0 in

## 2018-12-11 DIAGNOSIS — C9000 Multiple myeloma not having achieved remission: Secondary | ICD-10-CM | POA: Diagnosis not present

## 2018-12-11 LAB — CMP (CANCER CENTER ONLY)
ALT: 108 U/L — ABNORMAL HIGH (ref 0–44)
AST: 39 U/L (ref 15–41)
Albumin: 3.6 g/dL (ref 3.5–5.0)
Alkaline Phosphatase: 241 U/L — ABNORMAL HIGH (ref 38–126)
Anion gap: 12 (ref 5–15)
BUN: 25 mg/dL — ABNORMAL HIGH (ref 6–20)
CO2: 19 mmol/L — ABNORMAL LOW (ref 22–32)
Calcium: 9.2 mg/dL (ref 8.9–10.3)
Chloride: 105 mmol/L (ref 98–111)
Creatinine: 2.01 mg/dL — ABNORMAL HIGH (ref 0.61–1.24)
GFR, Est AFR Am: 45 mL/min — ABNORMAL LOW (ref >60)
GFR, Estimated: 39 mL/min — ABNORMAL LOW (ref >60)
Glucose, Bld: 109 mg/dL — ABNORMAL HIGH (ref 70–99)
Potassium: 5.5 mmol/L — ABNORMAL HIGH (ref 3.5–5.1)
Sodium: 136 mmol/L (ref 135–145)
Total Bilirubin: 0.4 mg/dL (ref 0.3–1.2)
Total Protein: 7.6 g/dL (ref 6.5–8.1)

## 2018-12-11 LAB — CBC WITH DIFFERENTIAL (CANCER CENTER ONLY)
Abs Immature Granulocytes: 0.38 10*3/uL — ABNORMAL HIGH (ref 0.00–0.07)
Basophils Absolute: 0.2 10*3/uL — ABNORMAL HIGH (ref 0.0–0.1)
Basophils Relative: 2 %
Eosinophils Absolute: 0.1 10*3/uL (ref 0.0–0.5)
Eosinophils Relative: 2 %
HCT: 34.7 % — ABNORMAL LOW (ref 39.0–52.0)
Hemoglobin: 11.4 g/dL — ABNORMAL LOW (ref 13.0–17.0)
Immature Granulocytes: 4 %
Lymphocytes Relative: 22 %
Lymphs Abs: 1.9 10*3/uL (ref 0.7–4.0)
MCH: 27.7 pg (ref 26.0–34.0)
MCHC: 32.9 g/dL (ref 30.0–36.0)
MCV: 84.4 fL (ref 80.0–100.0)
Monocytes Absolute: 0.7 10*3/uL (ref 0.1–1.0)
Monocytes Relative: 8 %
Neutro Abs: 5.6 10*3/uL (ref 1.7–7.7)
Neutrophils Relative %: 62 %
Platelet Count: 430 10*3/uL — ABNORMAL HIGH (ref 150–400)
RBC: 4.11 MIL/uL — ABNORMAL LOW (ref 4.22–5.81)
RDW: 13.7 % (ref 11.5–15.5)
WBC Count: 8.8 10*3/uL (ref 4.0–10.5)
nRBC: 0 % (ref 0.0–0.2)

## 2018-12-11 MED ORDER — METHOCARBAMOL 500 MG PO TABS: 500.0000 mg | ORAL_TABLET | Freq: Three times a day (TID) | ORAL | 0 refills | Status: DC | PRN

## 2018-12-11 MED ORDER — DEXAMETHASONE 4 MG PO TABS
ORAL_TABLET | ORAL | Status: AC
  Filled 2018-12-11: qty 10

## 2018-12-11 MED ORDER — BORTEZOMIB CHEMO SQ INJECTION 3.5 MG (2.5MG/ML)
1.3000 mg/m2 | Freq: Once | INTRAMUSCULAR | Status: AC
  Administered 2018-12-11: 2.75 mg via SUBCUTANEOUS
  Filled 2018-12-11: qty 1.1

## 2018-12-11 MED ORDER — ONDANSETRON HCL 8 MG PO TABS
ORAL_TABLET | ORAL | Status: AC
  Filled 2018-12-11: qty 1

## 2018-12-11 MED ORDER — DEXAMETHASONE 4 MG PO TABS
40.0000 mg | ORAL_TABLET | Freq: Once | ORAL | Status: AC
  Administered 2018-12-11: 40 mg via ORAL

## 2018-12-11 MED ORDER — ONDANSETRON HCL 8 MG PO TABS
4.0000 mg | ORAL_TABLET | Freq: Once | ORAL | Status: AC
  Administered 2018-12-11: 4 mg via ORAL

## 2018-12-11 MED FILL — SPS 15 GM/60 ML SUSPENSION: 15 | 1 days supply | Qty: 120 | Fill #0

## 2018-12-11 NOTE — Progress Notes (Signed)
Spoke with Dr. Benay Spice regarding Cyclophosphamide - when should pt take this med.  Per Dr. Benay Spice, pt to take Cytoxan po now - regardless of what time, pt needs to take on same day with Velcade.  Pt voiced understanding.  Pt took Cytoxan po as prescribed while waiting for Velcade.
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
## 2018-12-11 NOTE — Progress Notes (Signed)
TCT Dr. Serita Grit office regarding elevated K+ of 5.5. spoke with his assistant. She spoke with Dr. Posey Pronto.  They will call in Kayexalate order and also call his wife with some dietary recommendations. [name], NP made aware.

## 2018-12-11 NOTE — Patient Instructions (Signed)
[facility] Discharge Instructions for Patients Receiving Chemotherapy  Today you received the following chemotherapy agents Velcade To help prevent nausea and vomiting after your treatment, we encourage you to take your nausea medication as prescribed.   If you develop nausea and vomiting that is not controlled by your nausea medication, call the clinic.   BELOW ARE SYMPTOMS THAT SHOULD BE REPORTED IMMEDIATELY:  *FEVER GREATER THAN 100.5 F  *CHILLS WITH OR WITHOUT FEVER  NAUSEA AND VOMITING THAT IS NOT CONTROLLED WITH YOUR NAUSEA MEDICATION  *UNUSUAL SHORTNESS OF BREATH  *UNUSUAL BRUISING OR BLEEDING  TENDERNESS IN MOUTH AND THROAT WITH OR WITHOUT PRESENCE OF ULCERS  *URINARY PROBLEMS  *BOWEL PROBLEMS  UNUSUAL RASH Items with * indicate a potential emergency and should be followed up as soon as possible.  Feel free to call the clinic should you have any questions or concerns. The clinic phone number is [phone].  Please show the CHEMO ALERT CARD at check-in to the Emergency Department and triage nurse.   

## 2018-12-12 ENCOUNTER — Telehealth: Payer: Self-pay | Admitting: Family Medicine

## 2018-12-12 ENCOUNTER — Encounter: Payer: Self-pay | Admitting: Pharmacist

## 2018-12-12 NOTE — Telephone Encounter (Signed)
Leda Gauze, from [employer], called and is requesting a skilled nursing, OT, and medical social worker evaluation. She is requesting to have a script sent to [facility] for a shower chair and a hospital bed.   Height 6'1 Weight: 215lbs  [phone] secure

## 2018-12-12 NOTE — Progress Notes (Signed)
Oral Oncology Pharmacist Encounter  Patient is starting cycle 2 of bortezomib, cyclophosphamide, and dexamethasone for multiple myeloma not having achieved remission. Per notes, patient is going to continue on this combination regimen once weekly, 3 weeks on, 1 week off, and repeated.  Patient with complaints of trouble sleeping. No medication interactions with melatonin and patient's current medication list in Epic. There is a possibility of increased somnolence if alprazolam and melatonin are taken concurrently, but this is also the intended result. No change to medications indicated at this time.  Patient's SCr has improved to 2.01, est CrCl ~ 60 mL/min There are no dose reductions recommended by acyclovir manufacturer for CrCl > 50 mL/min  Appropriate acyclovir dosing for this patient for VZV prophylaxis is 400 mg by mouth twice daily  Patient will need a prescription sent to the pharmacy for this medication.  Patient is also in need of cyclophosphamide and dexamethasone prescriptions, if he will be taking these medications at home and will not have them administered in the office.  Johny Drilling, PharmD, BCPS, BCOP  12/12/2018 7:36 AM Oral Oncology Clinic [phone]

## 2018-12-13 ENCOUNTER — Telehealth: Payer: Self-pay | Admitting: Oncology

## 2018-12-13 ENCOUNTER — Telehealth: Payer: Self-pay | Admitting: *Deleted

## 2018-12-13 NOTE — Telephone Encounter (Signed)
Called pt regarding phone call about do's and don't foods that was given to him by nephrologist. Per Dr. Benay Spice, pt is to go by instructions that were provided to him from Dr. Posey Pronto his nephrologist. Pt made aware and verbalized understanding.

## 2018-12-13 NOTE — Telephone Encounter (Signed)
Verbal orders needed

## 2018-12-13 NOTE — Telephone Encounter (Signed)
Called and left msg

## 2018-12-14 ENCOUNTER — Telehealth: Payer: Self-pay | Admitting: *Deleted

## 2018-12-14 ENCOUNTER — Telehealth: Payer: Self-pay

## 2018-12-14 NOTE — Telephone Encounter (Signed)
Left VM that home health PT was ordered for discharge from hospital. PCP not willing to sign orders since he has not been there in 6 years. Asking if Dr. Benay Spice would be willing to sign orders for PT/OT/CSW and RN? Per Dr. Benay Spice: Yes, he will sign all home health orders as needed. Left this response on Derek [name]'s voice mail with office fax number.

## 2018-12-14 NOTE — Telephone Encounter (Signed)
I have not seen this patient for 6 years and they need to locate whoever is currently caring for the patient.  Ruben Reason MD

## 2018-12-14 NOTE — Telephone Encounter (Signed)
Called to let know that Derek [name] has not seen the pt in 6 yrs

## 2018-12-15 ENCOUNTER — Other Ambulatory Visit: Payer: Self-pay | Admitting: Nurse Practitioner

## 2018-12-15 ENCOUNTER — Telehealth: Payer: Self-pay | Admitting: Oncology

## 2018-12-15 ENCOUNTER — Other Ambulatory Visit: Payer: Self-pay | Admitting: Oncology

## 2018-12-15 ENCOUNTER — Telehealth: Payer: Self-pay | Admitting: *Deleted

## 2018-12-15 DIAGNOSIS — C9000 Multiple myeloma not having achieved remission: Secondary | ICD-10-CM

## 2018-12-15 MED ORDER — ACYCLOVIR 400 MG PO TABS: 400.0000 mg | ORAL_TABLET | Freq: Two times a day (BID) | ORAL | 3 refills | Status: DC

## 2018-12-15 NOTE — Telephone Encounter (Signed)
Called to report he is not doing well w/PT currently. Can barely sit for any length of time and getting up with walker causes more pain and heart rate increases to 125. He reports poor pain management and rates pain "10". Wanted MD aware for his visit on Monday.

## 2018-12-15 NOTE — Telephone Encounter (Signed)
Returned patient's phone call regarding rescheduling 09/29 appointments, per patient's request appointment has been moved to 09/28.

## 2018-12-18 ENCOUNTER — Inpatient Hospital Stay (HOSPITAL_BASED_OUTPATIENT_CLINIC_OR_DEPARTMENT_OTHER): Payer: BC Managed Care – PPO | Admitting: Oncology

## 2018-12-18 ENCOUNTER — Inpatient Hospital Stay: Payer: BC Managed Care – PPO

## 2018-12-18 ENCOUNTER — Other Ambulatory Visit: Payer: Self-pay

## 2018-12-18 ENCOUNTER — Other Ambulatory Visit: Payer: Self-pay | Admitting: *Deleted

## 2018-12-18 VITALS — BP 131/92 | HR 109 | Temp 98.5°F | Resp 18 | Ht 72.0 in | Wt 193.8 lb

## 2018-12-18 DIAGNOSIS — C9000 Multiple myeloma not having achieved remission: Secondary | ICD-10-CM | POA: Diagnosis not present

## 2018-12-18 LAB — CBC WITH DIFFERENTIAL (CANCER CENTER ONLY)
Abs Immature Granulocytes: 0.25 10*3/uL — ABNORMAL HIGH (ref 0.00–0.07)
Basophils Absolute: 0.1 10*3/uL (ref 0.0–0.1)
Basophils Relative: 2 %
Eosinophils Absolute: 0.2 10*3/uL (ref 0.0–0.5)
Eosinophils Relative: 3 %
HCT: 33.9 % — ABNORMAL LOW (ref 39.0–52.0)
Hemoglobin: 11.2 g/dL — ABNORMAL LOW (ref 13.0–17.0)
Immature Granulocytes: 3 %
Lymphocytes Relative: 26 %
Lymphs Abs: 1.9 10*3/uL (ref 0.7–4.0)
MCH: 27.5 pg (ref 26.0–34.0)
MCHC: 33 g/dL (ref 30.0–36.0)
MCV: 83.1 fL (ref 80.0–100.0)
Monocytes Absolute: 0.6 10*3/uL (ref 0.1–1.0)
Monocytes Relative: 8 %
Neutro Abs: 4.3 10*3/uL (ref 1.7–7.7)
Neutrophils Relative %: 58 %
Platelet Count: 355 10*3/uL (ref 150–400)
RBC: 4.08 MIL/uL — ABNORMAL LOW (ref 4.22–5.81)
RDW: 13.4 % (ref 11.5–15.5)
WBC Count: 7.4 10*3/uL (ref 4.0–10.5)
nRBC: 0 % (ref 0.0–0.2)

## 2018-12-18 LAB — CMP (CANCER CENTER ONLY)
ALT: 44 U/L (ref 0–44)
AST: 18 U/L (ref 15–41)
Albumin: 3.9 g/dL (ref 3.5–5.0)
Alkaline Phosphatase: 182 U/L — ABNORMAL HIGH (ref 38–126)
Anion gap: 11 (ref 5–15)
BUN: 15 mg/dL (ref 6–20)
CO2: 25 mmol/L (ref 22–32)
Calcium: 9.6 mg/dL (ref 8.9–10.3)
Chloride: 103 mmol/L (ref 98–111)
Creatinine: 1.5 mg/dL — ABNORMAL HIGH (ref 0.61–1.24)
GFR, Est AFR Am: >60 mL/min (ref >60)
GFR, Estimated: 56 mL/min — ABNORMAL LOW (ref >60)
Glucose, Bld: 108 mg/dL — ABNORMAL HIGH (ref 70–99)
Potassium: 4.4 mmol/L (ref 3.5–5.1)
Sodium: 139 mmol/L (ref 135–145)
Total Bilirubin: 0.3 mg/dL (ref 0.3–1.2)
Total Protein: 7.3 g/dL (ref 6.5–8.1)

## 2018-12-18 MED ORDER — ONDANSETRON HCL 8 MG PO TABS
4.0000 mg | ORAL_TABLET | Freq: Once | ORAL | Status: AC
  Administered 2018-12-18: 4 mg via ORAL

## 2018-12-18 MED ORDER — DEXAMETHASONE 4 MG PO TABS
ORAL_TABLET | ORAL | Status: AC
  Filled 2018-12-18: qty 10

## 2018-12-18 MED ORDER — ONDANSETRON HCL 8 MG PO TABS
ORAL_TABLET | ORAL | Status: AC
  Filled 2018-12-18: qty 1

## 2018-12-18 MED ORDER — BORTEZOMIB CHEMO SQ INJECTION 3.5 MG (2.5MG/ML)
1.3000 mg/m2 | Freq: Once | INTRAMUSCULAR | Status: AC
  Administered 2018-12-18: 2.75 mg via SUBCUTANEOUS
  Filled 2018-12-18: qty 1.1

## 2018-12-18 MED ORDER — DEXAMETHASONE 4 MG PO TABS
40.0000 mg | ORAL_TABLET | Freq: Once | ORAL | Status: AC
  Administered 2018-12-18: 14:00:00 40 mg via ORAL

## 2018-12-18 MED ORDER — CYCLOPHOSPHAMIDE 50 MG PO CAPS: ORAL_CAPSULE | ORAL | 0 refills | Status: DC

## 2018-12-18 MED FILL — CYCLOPHOSPHAMIDE 50 MG CAPS: 50 | 28 days supply | Qty: 40 | Fill #0

## 2018-12-18 NOTE — Patient Instructions (Signed)
[facility] Discharge Instructions for Patients Receiving Chemotherapy  Today you received the following chemotherapy agents Velcade To help prevent nausea and vomiting after your treatment, we encourage you to take your nausea medication as prescribed.   If you develop nausea and vomiting that is not controlled by your nausea medication, call the clinic.   BELOW ARE SYMPTOMS THAT SHOULD BE REPORTED IMMEDIATELY:  *FEVER GREATER THAN 100.5 F  *CHILLS WITH OR WITHOUT FEVER  NAUSEA AND VOMITING THAT IS NOT CONTROLLED WITH YOUR NAUSEA MEDICATION  *UNUSUAL SHORTNESS OF BREATH  *UNUSUAL BRUISING OR BLEEDING  TENDERNESS IN MOUTH AND THROAT WITH OR WITHOUT PRESENCE OF ULCERS  *URINARY PROBLEMS  *BOWEL PROBLEMS  UNUSUAL RASH Items with * indicate a potential emergency and should be followed up as soon as possible.  Feel free to call the clinic should you have any questions or concerns. The clinic phone number is [phone].  Please show the CHEMO ALERT CARD at check-in to the Emergency Department and triage nurse.   

## 2018-12-18 NOTE — Telephone Encounter (Signed)
Spoke with patient in infusion are and clarified that he does not have any cytoxan to take today. Confirmed that he filled #6 50 mg capsules at [facility] on 9/21. Informed him that since his liver functions are better, MD wants to increase his weekly cytoxan dose from 300 mg to 500 mg weekly. Script sent to [facility].
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
## 2018-12-19 ENCOUNTER — Inpatient Hospital Stay: Payer: BC Managed Care – PPO | Admitting: Nurse Practitioner

## 2018-12-19 ENCOUNTER — Inpatient Hospital Stay: Payer: BC Managed Care – PPO

## 2018-12-19 ENCOUNTER — Telehealth: Payer: Self-pay | Admitting: *Deleted

## 2018-12-19 ENCOUNTER — Telehealth: Payer: Self-pay | Admitting: Oncology

## 2018-12-19 NOTE — Telephone Encounter (Signed)
I left a message regarding schedule  

## 2018-12-19 NOTE — Telephone Encounter (Signed)
Referral faxed to [facility] Hematology/Oncology - att [place] - Release [id number]

## 2018-12-20 ENCOUNTER — Telehealth: Payer: Self-pay | Admitting: Family Medicine

## 2018-12-20 NOTE — Telephone Encounter (Signed)
Called and spoke with Derek [name] and informed her that we would not be able to give any verbals on this pt because he Is not an established pt. Also informed her to contact his oncologist for further referrals or verbal orders, she verbalized understanding.

## 2018-12-20 NOTE — Telephone Encounter (Addendum)
Please call [name] Social worker . Cell phone [phone] regarding this pt. it is a  Risk Management Situation. They are  needing orders in for equipment and wanting to address communication regarding patients  chemo treatment  abd the way that was handle with patient.   FR  Please disregard [name] this pt has not been seen since 2015 our office

## 2018-12-20 NOTE — Telephone Encounter (Signed)
Disregard this message patient last seen at our office 2015.

## 2018-12-21 ENCOUNTER — Other Ambulatory Visit: Payer: Self-pay | Admitting: Nurse Practitioner

## 2018-12-21 ENCOUNTER — Telehealth: Payer: Self-pay | Admitting: *Deleted

## 2018-12-21 DIAGNOSIS — C9000 Multiple myeloma not having achieved remission: Secondary | ICD-10-CM

## 2018-12-21 MED ORDER — METHOCARBAMOL 500 MG PO TABS: 500.0000 mg | ORAL_TABLET | Freq: Three times a day (TID) | ORAL | 0 refills | Status: DC | PRN

## 2018-12-21 MED ORDER — HYDROMORPHONE HCL 4 MG PO TABS: 4.0000 mg | ORAL_TABLET | ORAL | 0 refills | Status: DC | PRN

## 2018-12-21 NOTE — Telephone Encounter (Signed)
Derek [name] presented to office to request refill on Dilaudid and Robaxin. MD notified.

## 2018-12-22 ENCOUNTER — Telehealth: Payer: Self-pay | Admitting: *Deleted

## 2018-12-22 NOTE — Telephone Encounter (Signed)
FYI to make MD aware that patient declined his physical therapy today.

## 2018-12-24 ENCOUNTER — Other Ambulatory Visit: Payer: Self-pay | Admitting: Oncology

## 2018-12-25 ENCOUNTER — Telehealth: Payer: Self-pay | Admitting: *Deleted

## 2018-12-25 ENCOUNTER — Other Ambulatory Visit: Payer: BC Managed Care – PPO

## 2018-12-25 ENCOUNTER — Inpatient Hospital Stay: Payer: BC Managed Care – PPO | Attending: Oncology

## 2018-12-25 ENCOUNTER — Other Ambulatory Visit: Payer: Self-pay

## 2018-12-25 DIAGNOSIS — Z86718 Personal history of other venous thrombosis and embolism: Secondary | ICD-10-CM | POA: Diagnosis not present

## 2018-12-25 DIAGNOSIS — D649 Anemia, unspecified: Secondary | ICD-10-CM | POA: Insufficient documentation

## 2018-12-25 DIAGNOSIS — C9000 Multiple myeloma not having achieved remission: Secondary | ICD-10-CM | POA: Diagnosis present

## 2018-12-25 DIAGNOSIS — R7402 Elevation of levels of lactic acid dehydrogenase (LDH): Secondary | ICD-10-CM | POA: Insufficient documentation

## 2018-12-25 DIAGNOSIS — Z5112 Encounter for antineoplastic immunotherapy: Secondary | ICD-10-CM | POA: Insufficient documentation

## 2018-12-25 LAB — CBC WITH DIFFERENTIAL (CANCER CENTER ONLY)
Abs Immature Granulocytes: 0.2 10*3/uL — ABNORMAL HIGH (ref 0.00–0.07)
Basophils Absolute: 0.1 10*3/uL (ref 0.0–0.1)
Basophils Relative: 1 %
Eosinophils Absolute: 0.2 10*3/uL (ref 0.0–0.5)
Eosinophils Relative: 2 %
HCT: 31.2 % — ABNORMAL LOW (ref 39.0–52.0)
Hemoglobin: 10.5 g/dL — ABNORMAL LOW (ref 13.0–17.0)
Immature Granulocytes: 2 %
Lymphocytes Relative: 24 %
Lymphs Abs: 2 10*3/uL (ref 0.7–4.0)
MCH: 28 pg (ref 26.0–34.0)
MCHC: 33.7 g/dL (ref 30.0–36.0)
MCV: 83.2 fL (ref 80.0–100.0)
Monocytes Absolute: 0.7 10*3/uL (ref 0.1–1.0)
Monocytes Relative: 9 %
Neutro Abs: 5.2 10*3/uL (ref 1.7–7.7)
Neutrophils Relative %: 62 %
Platelet Count: 276 10*3/uL (ref 150–400)
RBC: 3.75 MIL/uL — ABNORMAL LOW (ref 4.22–5.81)
RDW: 13.8 % (ref 11.5–15.5)
WBC Count: 8.3 10*3/uL (ref 4.0–10.5)
nRBC: 0 % (ref 0.0–0.2)

## 2018-12-25 LAB — CMP (CANCER CENTER ONLY)
ALT: 25 U/L (ref 0–44)
AST: 14 U/L — ABNORMAL LOW (ref 15–41)
Albumin: 3.8 g/dL (ref 3.5–5.0)
Alkaline Phosphatase: 135 U/L — ABNORMAL HIGH (ref 38–126)
Anion gap: 12 (ref 5–15)
BUN: 13 mg/dL (ref 6–20)
CO2: 24 mmol/L (ref 22–32)
Calcium: 9.5 mg/dL (ref 8.9–10.3)
Chloride: 104 mmol/L (ref 98–111)
Creatinine: 1.39 mg/dL — ABNORMAL HIGH (ref 0.61–1.24)
GFR, Est AFR Am: >60 mL/min (ref >60)
GFR, Estimated: >60 mL/min (ref >60)
Glucose, Bld: 90 mg/dL (ref 70–99)
Potassium: 4.4 mmol/L (ref 3.5–5.1)
Sodium: 140 mmol/L (ref 135–145)
Total Bilirubin: 0.3 mg/dL (ref 0.3–1.2)
Total Protein: 7 g/dL (ref 6.5–8.1)

## 2018-12-25 NOTE — Telephone Encounter (Signed)
Visit today by OT and requesting verbal order to continue weekly visit x 2. Per Dr. Benay Spice: [name]

## 2018-12-26 ENCOUNTER — Ambulatory Visit: Payer: BC Managed Care – PPO | Admitting: Nurse Practitioner

## 2018-12-26 ENCOUNTER — Encounter: Payer: Self-pay | Admitting: Nurse Practitioner

## 2018-12-26 ENCOUNTER — Inpatient Hospital Stay (HOSPITAL_BASED_OUTPATIENT_CLINIC_OR_DEPARTMENT_OTHER): Payer: BC Managed Care – PPO | Admitting: Nurse Practitioner

## 2018-12-26 ENCOUNTER — Other Ambulatory Visit: Payer: Self-pay

## 2018-12-26 ENCOUNTER — Inpatient Hospital Stay: Payer: BC Managed Care – PPO

## 2018-12-26 ENCOUNTER — Other Ambulatory Visit: Payer: BC Managed Care – PPO

## 2018-12-26 ENCOUNTER — Ambulatory Visit: Payer: BC Managed Care – PPO

## 2018-12-26 VITALS — BP 131/85 | HR 93 | Temp 98.9°F | Resp 18 | Ht 72.0 in | Wt 195.4 lb

## 2018-12-26 DIAGNOSIS — C9000 Multiple myeloma not having achieved remission: Secondary | ICD-10-CM | POA: Diagnosis not present

## 2018-12-26 LAB — KAPPA/LAMBDA LIGHT CHAINS
Kappa free light chain: 6465.6 mg/L — ABNORMAL HIGH (ref 3.3–19.4)
Kappa, lambda light chain ratio: 910.65 — ABNORMAL HIGH (ref 0.26–1.65)
Lambda free light chains: 7.1 mg/L (ref 5.7–26.3)

## 2018-12-26 MED ORDER — DEXAMETHASONE 4 MG PO TABS
ORAL_TABLET | ORAL | Status: AC
  Filled 2018-12-26: qty 10

## 2018-12-26 MED ORDER — ONDANSETRON HCL 8 MG PO TABS
4.0000 mg | ORAL_TABLET | Freq: Once | ORAL | Status: AC
  Administered 2018-12-26: 4 mg via ORAL

## 2018-12-26 MED ORDER — DEXAMETHASONE 4 MG PO TABS
40.0000 mg | ORAL_TABLET | Freq: Once | ORAL | Status: AC
  Administered 2018-12-26: 40 mg via ORAL

## 2018-12-26 MED ORDER — ONDANSETRON HCL 8 MG PO TABS
ORAL_TABLET | ORAL | Status: AC
  Filled 2018-12-26: qty 1

## 2018-12-26 MED ORDER — BORTEZOMIB CHEMO SQ INJECTION 3.5 MG (2.5MG/ML)
1.3000 mg/m2 | Freq: Once | INTRAMUSCULAR | Status: AC
  Administered 2018-12-26: 2.75 mg via SUBCUTANEOUS
  Filled 2018-12-26: qty 1.1

## 2018-12-26 NOTE — Patient Instructions (Signed)
[facility] Discharge Instructions for Patients Receiving Chemotherapy  Today you received the following chemotherapy agents Velcade To help prevent nausea and vomiting after your treatment, we encourage you to take your nausea medication as prescribed.   If you develop nausea and vomiting that is not controlled by your nausea medication, call the clinic.   BELOW ARE SYMPTOMS THAT SHOULD BE REPORTED IMMEDIATELY:  *FEVER GREATER THAN 100.5 F  *CHILLS WITH OR WITHOUT FEVER  NAUSEA AND VOMITING THAT IS NOT CONTROLLED WITH YOUR NAUSEA MEDICATION  *UNUSUAL SHORTNESS OF BREATH  *UNUSUAL BRUISING OR BLEEDING  TENDERNESS IN MOUTH AND THROAT WITH OR WITHOUT PRESENCE OF ULCERS  *URINARY PROBLEMS  *BOWEL PROBLEMS  UNUSUAL RASH Items with * indicate a potential emergency and should be followed up as soon as possible.  Feel free to call the clinic should you have any questions or concerns. The clinic phone number is [phone].  Please show the CHEMO ALERT CARD at check-in to the Emergency Department and triage nurse.   
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
## 2018-12-27 ENCOUNTER — Telehealth: Payer: Self-pay | Admitting: Nurse Practitioner

## 2018-12-27 NOTE — Telephone Encounter (Signed)
Scheduled per los. Called and spoke with patient. Confirmed appts  

## 2018-12-28 ENCOUNTER — Telehealth: Payer: Self-pay | Admitting: Oncology

## 2018-12-28 NOTE — Telephone Encounter (Signed)
Returned patient's phone call regarding rescheduling an appointment, per request 10/12 appointment time changed.

## 2018-12-31 ENCOUNTER — Other Ambulatory Visit: Payer: Self-pay | Admitting: Oncology

## 2019-01-01 ENCOUNTER — Other Ambulatory Visit: Payer: Self-pay | Admitting: Nurse Practitioner

## 2019-01-01 ENCOUNTER — Other Ambulatory Visit: Payer: BC Managed Care – PPO

## 2019-01-01 ENCOUNTER — Ambulatory Visit: Payer: BC Managed Care – PPO

## 2019-01-01 ENCOUNTER — Inpatient Hospital Stay: Payer: BC Managed Care – PPO

## 2019-01-01 ENCOUNTER — Other Ambulatory Visit: Payer: Self-pay

## 2019-01-01 VITALS — BP 133/85 | HR 84 | Resp 17

## 2019-01-01 DIAGNOSIS — C9000 Multiple myeloma not having achieved remission: Secondary | ICD-10-CM

## 2019-01-01 LAB — CMP (CANCER CENTER ONLY)
ALT: 20 U/L (ref 0–44)
AST: 14 U/L — ABNORMAL LOW (ref 15–41)
Albumin: 3.6 g/dL (ref 3.5–5.0)
Alkaline Phosphatase: 108 U/L (ref 38–126)
Anion gap: 10 (ref 5–15)
BUN: 11 mg/dL (ref 6–20)
CO2: 25 mmol/L (ref 22–32)
Calcium: 9.4 mg/dL (ref 8.9–10.3)
Chloride: 105 mmol/L (ref 98–111)
Creatinine: 1.29 mg/dL — ABNORMAL HIGH (ref 0.61–1.24)
GFR, Est AFR Am: >60 mL/min (ref >60)
GFR, Estimated: >60 mL/min (ref >60)
Glucose, Bld: 96 mg/dL (ref 70–99)
Potassium: 4.4 mmol/L (ref 3.5–5.1)
Sodium: 140 mmol/L (ref 135–145)
Total Bilirubin: 0.3 mg/dL (ref 0.3–1.2)
Total Protein: 6.8 g/dL (ref 6.5–8.1)

## 2019-01-01 LAB — CBC WITH DIFFERENTIAL (CANCER CENTER ONLY)
Abs Immature Granulocytes: 0.27 10*3/uL — ABNORMAL HIGH (ref 0.00–0.07)
Basophils Absolute: 0.1 10*3/uL (ref 0.0–0.1)
Basophils Relative: 1 %
Eosinophils Absolute: 0.1 10*3/uL (ref 0.0–0.5)
Eosinophils Relative: 1 %
HCT: 29.8 % — ABNORMAL LOW (ref 39.0–52.0)
Hemoglobin: 9.9 g/dL — ABNORMAL LOW (ref 13.0–17.0)
Immature Granulocytes: 3 %
Lymphocytes Relative: 19 %
Lymphs Abs: 1.8 10*3/uL (ref 0.7–4.0)
MCH: 27.7 pg (ref 26.0–34.0)
MCHC: 33.2 g/dL (ref 30.0–36.0)
MCV: 83.5 fL (ref 80.0–100.0)
Monocytes Absolute: 0.6 10*3/uL (ref 0.1–1.0)
Monocytes Relative: 7 %
Neutro Abs: 6.2 10*3/uL (ref 1.7–7.7)
Neutrophils Relative %: 69 %
Platelet Count: 215 10*3/uL (ref 150–400)
RBC: 3.57 MIL/uL — ABNORMAL LOW (ref 4.22–5.81)
RDW: 14.2 % (ref 11.5–15.5)
WBC Count: 9.1 10*3/uL (ref 4.0–10.5)
nRBC: 0 % (ref 0.0–0.2)

## 2019-01-01 MED ORDER — DEXAMETHASONE 4 MG PO TABS
ORAL_TABLET | ORAL | Status: AC
  Filled 2019-01-01: qty 10

## 2019-01-01 MED ORDER — ONDANSETRON HCL 8 MG PO TABS
ORAL_TABLET | ORAL | Status: AC
  Filled 2019-01-01: qty 1

## 2019-01-01 MED ORDER — DEXAMETHASONE 4 MG PO TABS
40.0000 mg | ORAL_TABLET | Freq: Once | ORAL | Status: AC
  Administered 2019-01-01: 40 mg via ORAL

## 2019-01-01 MED ORDER — ONDANSETRON HCL 8 MG PO TABS
4.0000 mg | ORAL_TABLET | Freq: Once | ORAL | Status: AC
  Administered 2019-01-01: 4 mg via ORAL

## 2019-01-01 MED ORDER — BORTEZOMIB CHEMO SQ INJECTION 3.5 MG (2.5MG/ML)
1.3000 mg/m2 | Freq: Once | INTRAMUSCULAR | Status: AC
  Administered 2019-01-01: 2.75 mg via SUBCUTANEOUS
  Filled 2019-01-01: qty 1.1

## 2019-01-01 MED ORDER — CYCLOBENZAPRINE HCL 5 MG PO TABS: 5.0000 mg | ORAL_TABLET | Freq: Three times a day (TID) | ORAL | 0 refills | Status: DC | PRN

## 2019-01-01 NOTE — Progress Notes (Signed)
Brought patient back for Velcade injection. Asked him if he had spoken with a pharmacist and made a decision about receiving Zometa infusions. Patient stated he spoke with someone last appointment and decided he didn't not want to pursue Zometa because of the potential side effects. Asked him if he would be willing to try Xgeva since it is a subcutaneous injection rather then an infusion. Patient replied he was not interested because he's "getting better without it and I don't feel like it's worth the potential risk. If it was medically necessary for my treatment, then maybe I'd consider it, but currently I don't need it and I don't want it".   Passed information along to pharmacy team and will route this message to Dr. Benay Spice and Marlynn Perking so they are aware.

## 2019-01-01 NOTE — Patient Instructions (Signed)
[facility] Discharge Instructions for Patients Receiving Chemotherapy  Today you received the following chemotherapy agents: Bortezomib (Velcade)  To help prevent nausea and vomiting after your treatment, we encourage you to take your nausea medication as directed.   If you develop nausea and vomiting that is not controlled by your nausea medication, call the clinic.   BELOW ARE SYMPTOMS THAT SHOULD BE REPORTED IMMEDIATELY:  *FEVER GREATER THAN 100.5 F  *CHILLS WITH OR WITHOUT FEVER  NAUSEA AND VOMITING THAT IS NOT CONTROLLED WITH YOUR NAUSEA MEDICATION  *UNUSUAL SHORTNESS OF BREATH  *UNUSUAL BRUISING OR BLEEDING  TENDERNESS IN MOUTH AND THROAT WITH OR WITHOUT PRESENCE OF ULCERS  *URINARY PROBLEMS  *BOWEL PROBLEMS  UNUSUAL RASH Items with * indicate a potential emergency and should be followed up as soon as possible.  Feel free to call the clinic should you have any questions or concerns. The clinic phone number is [phone].  Please show the CHEMO ALERT CARD at check-in to the Emergency Department and triage nurse.  Coronavirus (COVID-19) Are you at risk?  Are you at risk for the Coronavirus (COVID-19)?  To be considered HIGH RISK for Coronavirus (COVID-19), you have to meet the following criteria:  . Traveled to [place], [place], [place], [place] or [place]; or in the [place] to [place], [place], [place], or [place]; and have fever, cough, and shortness of breath within the last 2 weeks of travel OR . Been in close contact with a person diagnosed with COVID-19 within the last 2 weeks and have fever, cough, and shortness of breath . IF YOU DO NOT MEET THESE CRITERIA, YOU ARE CONSIDERED LOW RISK FOR COVID-19.  What to do if you are HIGH RISK for COVID-19?  . If you are having a medical emergency, call 911. . Seek medical care right away. Before you go to a doctor's office, urgent care or emergency department, call ahead and tell them  about your recent travel, contact with someone diagnosed with COVID-19, and your symptoms. You should receive instructions from your physician's office regarding next steps of care.  . When you arrive at healthcare provider, tell the healthcare staff immediately you have returned from visiting [place], [place], [place], [place] or [place]; or traveled in the [place] to [place], [place], [place], or [place]; in the last two weeks or you have been in close contact with a person diagnosed with COVID-19 in the last 2 weeks.   . Tell the health care staff about your symptoms: fever, cough and shortness of breath. . After you have been seen by a medical provider, you will be either: o Tested for (COVID-19) and discharged home on quarantine except to seek medical care if symptoms worsen, and asked to  - Stay home and avoid contact with others until you get your results (4-5 days)  - Avoid travel on public transportation if possible (such as bus, train, or airplane) or o Sent to the Emergency Department by EMS for evaluation, COVID-19 testing, and possible admission depending on your condition and test results.  What to do if you are LOW RISK for COVID-19?  Reduce your risk of any infection by using the same precautions used for avoiding the common cold or flu:  . Wash your hands often with soap and warm water for at least 20 seconds.  If soap and water are not readily available, use an alcohol-based hand sanitizer with at least 60% alcohol.  . If coughing or   sneezing, cover your mouth and nose by coughing or sneezing into the elbow areas of your shirt or coat, into a tissue or into your sleeve (not your hands). . Avoid shaking hands with others and consider head nods or verbal greetings only. . Avoid touching your eyes, nose, or mouth with unwashed hands.  . Avoid close contact with people who are sick. . Avoid places or events with large numbers of people in one location, like concerts or  sporting events. . Carefully consider travel plans you have or are making. . If you are planning any travel outside or inside the [place], visit the CDC's Travelers' Health webpage for the latest health notices. . If you have some symptoms but not all symptoms, continue to monitor at home and seek medical attention if your symptoms worsen. . If you are having a medical emergency, call 911.   ADDITIONAL HEALTHCARE OPTIONS FOR PATIENTS  [facility] / e-Visit: [email or web address]         [facility]: [phone]  [facility]: [phone]                   [facility]: [phone]   

## 2019-01-04 ENCOUNTER — Telehealth: Payer: Self-pay | Admitting: *Deleted

## 2019-01-04 NOTE — Telephone Encounter (Signed)
Called referral office for Dr. Costella Hatcher Rodreguez-Valdes at [facility] to follow up on referral. Maudie Mercury reports she has called twice and left VM with no return call. Informed her I will contact his significant other,Deidrato encourage him to return call. Called [facility] and informed her of above and requested she encourage Mr. Kalich to call them. They were under impression they do not need to see him till after all chemo is completed. Informed her that he needs to be seen now, since planning for BMT can take time and he needs to be educated and evaluated for eligibility for this as well as insurance approval. She will ask him to call.

## 2019-01-05 ENCOUNTER — Telehealth: Payer: Self-pay | Admitting: *Deleted

## 2019-01-05 NOTE — Telephone Encounter (Signed)
Call from PT/OT staff requesting verbal order to extend visit to next week. This week's visits were not done at patient request. Called and provided OK per MD>

## 2019-01-07 ENCOUNTER — Other Ambulatory Visit: Payer: Self-pay | Admitting: Oncology

## 2019-01-08 ENCOUNTER — Inpatient Hospital Stay: Payer: BC Managed Care – PPO

## 2019-01-08 ENCOUNTER — Telehealth: Payer: Self-pay | Admitting: *Deleted

## 2019-01-08 ENCOUNTER — Other Ambulatory Visit: Payer: Self-pay | Admitting: Oncology

## 2019-01-08 ENCOUNTER — Inpatient Hospital Stay: Payer: BC Managed Care – PPO | Admitting: Oncology

## 2019-01-08 ENCOUNTER — Telehealth: Payer: Self-pay | Admitting: Oncology

## 2019-01-08 ENCOUNTER — Other Ambulatory Visit: Payer: Self-pay | Admitting: Medical Oncology

## 2019-01-08 NOTE — Telephone Encounter (Addendum)
Called patient to follow up on his "no show" today. He reports leaving message for scheduler on Friday and again this morning that he needs to reschedule. Can only come at 2 pm or later due to transportation. Per Dr. Benay Spice: Schedule for 10/20 or 10/22 and see [name] at 2:45. Will need adjust following week appointment as well. High priority scheduling message sent.

## 2019-01-08 NOTE — Telephone Encounter (Signed)
Scheduled appt per 10/19 sch message - pt is aware of appt date and time   

## 2019-01-09 ENCOUNTER — Inpatient Hospital Stay: Payer: BC Managed Care – PPO

## 2019-01-09 ENCOUNTER — Other Ambulatory Visit: Payer: Self-pay

## 2019-01-09 ENCOUNTER — Encounter: Payer: Self-pay | Admitting: Nurse Practitioner

## 2019-01-09 ENCOUNTER — Telehealth: Payer: Self-pay | Admitting: Oncology

## 2019-01-09 ENCOUNTER — Inpatient Hospital Stay (HOSPITAL_BASED_OUTPATIENT_CLINIC_OR_DEPARTMENT_OTHER): Payer: BC Managed Care – PPO | Admitting: Nurse Practitioner

## 2019-01-09 VITALS — HR 89

## 2019-01-09 VITALS — BP 134/98 | HR 89 | Temp 99.6°F | Resp 18 | Ht 72.0 in

## 2019-01-09 DIAGNOSIS — C9000 Multiple myeloma not having achieved remission: Secondary | ICD-10-CM | POA: Diagnosis not present

## 2019-01-09 LAB — CBC WITH DIFFERENTIAL (CANCER CENTER ONLY)
Abs Immature Granulocytes: 0.25 10*3/uL — ABNORMAL HIGH (ref 0.00–0.07)
Basophils Absolute: 0.1 10*3/uL (ref 0.0–0.1)
Basophils Relative: 1 %
Eosinophils Absolute: 0.1 10*3/uL (ref 0.0–0.5)
Eosinophils Relative: 1 %
HCT: 28.1 % — ABNORMAL LOW (ref 39.0–52.0)
Hemoglobin: 9.7 g/dL — ABNORMAL LOW (ref 13.0–17.0)
Immature Granulocytes: 3 %
Lymphocytes Relative: 28 %
Lymphs Abs: 2.2 10*3/uL (ref 0.7–4.0)
MCH: 28.4 pg (ref 26.0–34.0)
MCHC: 34.5 g/dL (ref 30.0–36.0)
MCV: 82.4 fL (ref 80.0–100.0)
Monocytes Absolute: 0.6 10*3/uL (ref 0.1–1.0)
Monocytes Relative: 7 %
Neutro Abs: 4.5 10*3/uL (ref 1.7–7.7)
Neutrophils Relative %: 60 %
Platelet Count: 246 10*3/uL (ref 150–400)
RBC: 3.41 MIL/uL — ABNORMAL LOW (ref 4.22–5.81)
RDW: 14.9 % (ref 11.5–15.5)
WBC Count: 7.6 10*3/uL (ref 4.0–10.5)
nRBC: 0 % (ref 0.0–0.2)

## 2019-01-09 LAB — CMP (CANCER CENTER ONLY)
ALT: 24 U/L (ref 0–44)
AST: 15 U/L (ref 15–41)
Albumin: 3.8 g/dL (ref 3.5–5.0)
Alkaline Phosphatase: 104 U/L (ref 38–126)
Anion gap: 16 — ABNORMAL HIGH (ref 5–15)
BUN: 11 mg/dL (ref 6–20)
CO2: 21 mmol/L — ABNORMAL LOW (ref 22–32)
Calcium: 9.6 mg/dL (ref 8.9–10.3)
Chloride: 105 mmol/L (ref 98–111)
Creatinine: 1.3 mg/dL — ABNORMAL HIGH (ref 0.61–1.24)
GFR, Est AFR Am: >60 mL/min (ref >60)
GFR, Estimated: >60 mL/min (ref >60)
Glucose, Bld: 118 mg/dL — ABNORMAL HIGH (ref 70–99)
Potassium: 3.7 mmol/L (ref 3.5–5.1)
Sodium: 142 mmol/L (ref 135–145)
Total Bilirubin: 0.3 mg/dL (ref 0.3–1.2)
Total Protein: 7.2 g/dL (ref 6.5–8.1)

## 2019-01-09 MED ORDER — DEXAMETHASONE 4 MG PO TABS
40.0000 mg | ORAL_TABLET | Freq: Once | ORAL | Status: AC
  Administered 2019-01-09: 40 mg via ORAL

## 2019-01-09 MED ORDER — DEXAMETHASONE 4 MG PO TABS
ORAL_TABLET | ORAL | Status: AC
  Filled 2019-01-09: qty 5

## 2019-01-09 MED ORDER — ONDANSETRON HCL 8 MG PO TABS
4.0000 mg | ORAL_TABLET | Freq: Once | ORAL | Status: AC
  Administered 2019-01-09: 4 mg via ORAL

## 2019-01-09 MED ORDER — ONDANSETRON HCL 8 MG PO TABS
ORAL_TABLET | ORAL | Status: AC
  Filled 2019-01-09: qty 1

## 2019-01-09 MED ORDER — BORTEZOMIB CHEMO SQ INJECTION 3.5 MG (2.5MG/ML)
1.3000 mg/m2 | Freq: Once | INTRAMUSCULAR | Status: AC
  Administered 2019-01-09: 2.75 mg via SUBCUTANEOUS
  Filled 2019-01-09: qty 1.1

## 2019-01-09 MED ORDER — DEXAMETHASONE 4 MG PO TABS
ORAL_TABLET | ORAL | Status: AC
  Filled 2019-01-09: qty 10
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
## 2019-01-09 NOTE — Telephone Encounter (Signed)
Scheduled per 10/20 los, rescheduled October and November appointment times. Patient received new calender.

## 2019-01-09 NOTE — Patient Instructions (Signed)
[facility] Discharge Instructions for Patients Receiving Chemotherapy  Today you received the following chemotherapy agents: Bortezomib (Velcade)  To help prevent nausea and vomiting after your treatment, we encourage you to take your nausea medication as directed.   If you develop nausea and vomiting that is not controlled by your nausea medication, call the clinic.   BELOW ARE SYMPTOMS THAT SHOULD BE REPORTED IMMEDIATELY:  *FEVER GREATER THAN 100.5 F  *CHILLS WITH OR WITHOUT FEVER  NAUSEA AND VOMITING THAT IS NOT CONTROLLED WITH YOUR NAUSEA MEDICATION  *UNUSUAL SHORTNESS OF BREATH  *UNUSUAL BRUISING OR BLEEDING  TENDERNESS IN MOUTH AND THROAT WITH OR WITHOUT PRESENCE OF ULCERS  *URINARY PROBLEMS  *BOWEL PROBLEMS  UNUSUAL RASH Items with * indicate a potential emergency and should be followed up as soon as possible.  Feel free to call the clinic should you have any questions or concerns. The clinic phone number is [phone].  Please show the CHEMO ALERT CARD at check-in to the Emergency Department and triage nurse.  Coronavirus (COVID-19) Are you at risk?  Are you at risk for the Coronavirus (COVID-19)?  To be considered HIGH RISK for Coronavirus (COVID-19), you have to meet the following criteria:  . Traveled to [place], [place], [place], [place] or [place]; or in the [place] to [place], [place], [place], or [place]; and have fever, cough, and shortness of breath within the last 2 weeks of travel OR . Been in close contact with a person diagnosed with COVID-19 within the last 2 weeks and have fever, cough, and shortness of breath . IF YOU DO NOT MEET THESE CRITERIA, YOU ARE CONSIDERED LOW RISK FOR COVID-19.  What to do if you are HIGH RISK for COVID-19?  . If you are having a medical emergency, call 911. . Seek medical care right away. Before you go to a doctor's office, urgent care or emergency department, call ahead and tell them  about your recent travel, contact with someone diagnosed with COVID-19, and your symptoms. You should receive instructions from your physician's office regarding next steps of care.  . When you arrive at healthcare provider, tell the healthcare staff immediately you have returned from visiting [place], [place], [place], [place] or [place]; or traveled in the [place] to [place], [place], [place], or [place]; in the last two weeks or you have been in close contact with a person diagnosed with COVID-19 in the last 2 weeks.   . Tell the health care staff about your symptoms: fever, cough and shortness of breath. . After you have been seen by a medical provider, you will be either: o Tested for (COVID-19) and discharged home on quarantine except to seek medical care if symptoms worsen, and asked to  - Stay home and avoid contact with others until you get your results (4-5 days)  - Avoid travel on public transportation if possible (such as bus, train, or airplane) or o Sent to the Emergency Department by EMS for evaluation, COVID-19 testing, and possible admission depending on your condition and test results.  What to do if you are LOW RISK for COVID-19?  Reduce your risk of any infection by using the same precautions used for avoiding the common cold or flu:  . Wash your hands often with soap and warm water for at least 20 seconds.  If soap and water are not readily available, use an alcohol-based hand sanitizer with at least 60% alcohol.  . If coughing or   sneezing, cover your mouth and nose by coughing or sneezing into the elbow areas of your shirt or coat, into a tissue or into your sleeve (not your hands). . Avoid shaking hands with others and consider head nods or verbal greetings only. . Avoid touching your eyes, nose, or mouth with unwashed hands.  . Avoid close contact with people who are sick. . Avoid places or events with large numbers of people in one location, like concerts or  sporting events. . Carefully consider travel plans you have or are making. . If you are planning any travel outside or inside the [place], visit the CDC's Travelers' Health webpage for the latest health notices. . If you have some symptoms but not all symptoms, continue to monitor at home and seek medical attention if your symptoms worsen. . If you are having a medical emergency, call 911.   ADDITIONAL HEALTHCARE OPTIONS FOR PATIENTS  [facility] / e-Visit: [email or web address]         [facility]: [phone]  [facility]: [phone]                   [facility]: [phone]   

## 2019-01-11 MED FILL — CYCLOPHOSPHAMIDE 50 MG CAPS: 50 | 28 days supply | Qty: 40 | Fill #0

## 2019-01-12 ENCOUNTER — Telehealth: Payer: Self-pay | Admitting: *Deleted

## 2019-01-12 ENCOUNTER — Other Ambulatory Visit: Payer: Self-pay | Admitting: Nurse Practitioner

## 2019-01-12 DIAGNOSIS — C9000 Multiple myeloma not having achieved remission: Secondary | ICD-10-CM

## 2019-01-12 MED ORDER — CYCLOBENZAPRINE HCL 5 MG PO TABS: 5.0000 mg | ORAL_TABLET | Freq: Three times a day (TID) | ORAL | 0 refills | Status: DC | PRN

## 2019-01-12 NOTE — Telephone Encounter (Signed)
Being discharged from OT since goals are met. Has tub seat and is able to get in and out easily. Performs all exercises independently now.

## 2019-01-15 ENCOUNTER — Inpatient Hospital Stay: Payer: BC Managed Care – PPO

## 2019-01-15 ENCOUNTER — Other Ambulatory Visit: Payer: Self-pay

## 2019-01-15 VITALS — BP 148/94 | HR 96 | Temp 98.7°F | Resp 18

## 2019-01-15 DIAGNOSIS — C9000 Multiple myeloma not having achieved remission: Secondary | ICD-10-CM

## 2019-01-15 LAB — CBC WITH DIFFERENTIAL (CANCER CENTER ONLY)
Abs Immature Granulocytes: 0.35 10*3/uL — ABNORMAL HIGH (ref 0.00–0.07)
Basophils Absolute: 0 10*3/uL (ref 0.0–0.1)
Basophils Relative: 1 %
Eosinophils Absolute: 0.1 10*3/uL (ref 0.0–0.5)
Eosinophils Relative: 1 %
HCT: 28.7 % — ABNORMAL LOW (ref 39.0–52.0)
Hemoglobin: 9.7 g/dL — ABNORMAL LOW (ref 13.0–17.0)
Immature Granulocytes: 5 %
Lymphocytes Relative: 31 %
Lymphs Abs: 2.2 10*3/uL (ref 0.7–4.0)
MCH: 28.5 pg (ref 26.0–34.0)
MCHC: 33.8 g/dL (ref 30.0–36.0)
MCV: 84.4 fL (ref 80.0–100.0)
Monocytes Absolute: 0.5 10*3/uL (ref 0.1–1.0)
Monocytes Relative: 8 %
Neutro Abs: 3.9 10*3/uL (ref 1.7–7.7)
Neutrophils Relative %: 54 %
Platelet Count: 226 10*3/uL (ref 150–400)
RBC: 3.4 MIL/uL — ABNORMAL LOW (ref 4.22–5.81)
RDW: 15.9 % — ABNORMAL HIGH (ref 11.5–15.5)
WBC Count: 7.2 10*3/uL (ref 4.0–10.5)
nRBC: 0 % (ref 0.0–0.2)

## 2019-01-15 LAB — CMP (CANCER CENTER ONLY)
ALT: 32 U/L (ref 0–44)
AST: 17 U/L (ref 15–41)
Albumin: 3.7 g/dL (ref 3.5–5.0)
Alkaline Phosphatase: 95 U/L (ref 38–126)
Anion gap: 13 (ref 5–15)
BUN: 17 mg/dL (ref 6–20)
CO2: 23 mmol/L (ref 22–32)
Calcium: 9.5 mg/dL (ref 8.9–10.3)
Chloride: 105 mmol/L (ref 98–111)
Creatinine: 1.36 mg/dL — ABNORMAL HIGH (ref 0.61–1.24)
GFR, Est AFR Am: >60 mL/min (ref >60)
GFR, Estimated: >60 mL/min (ref >60)
Glucose, Bld: 111 mg/dL — ABNORMAL HIGH (ref 70–99)
Potassium: 4 mmol/L (ref 3.5–5.1)
Sodium: 141 mmol/L (ref 135–145)
Total Bilirubin: 0.4 mg/dL (ref 0.3–1.2)
Total Protein: 6.8 g/dL (ref 6.5–8.1)

## 2019-01-15 MED ORDER — BORTEZOMIB CHEMO SQ INJECTION 3.5 MG (2.5MG/ML)
1.3000 mg/m2 | Freq: Once | INTRAMUSCULAR | Status: AC
  Administered 2019-01-15: 2.75 mg via SUBCUTANEOUS
  Filled 2019-01-15: qty 1.1

## 2019-01-15 MED ORDER — DEXAMETHASONE 4 MG PO TABS
ORAL_TABLET | ORAL | Status: AC
  Filled 2019-01-15: qty 10

## 2019-01-15 MED ORDER — ONDANSETRON HCL 8 MG PO TABS
4.0000 mg | ORAL_TABLET | Freq: Once | ORAL | Status: AC
  Administered 2019-01-15: 16:00:00 4 mg via ORAL

## 2019-01-15 MED ORDER — DEXAMETHASONE 4 MG PO TABS
40.0000 mg | ORAL_TABLET | Freq: Once | ORAL | Status: AC
  Administered 2019-01-15: 16:00:00 40 mg via ORAL

## 2019-01-15 MED ORDER — ONDANSETRON HCL 8 MG PO TABS
ORAL_TABLET | ORAL | Status: AC
  Filled 2019-01-15: qty 1

## 2019-01-15 NOTE — Patient Instructions (Signed)
[facility] Discharge Instructions for Patients Receiving Chemotherapy  Today you received the following chemotherapy agents: Bortezomib (Velcade)  To help prevent nausea and vomiting after your treatment, we encourage you to take your nausea medication as directed.   If you develop nausea and vomiting that is not controlled by your nausea medication, call the clinic.   BELOW ARE SYMPTOMS THAT SHOULD BE REPORTED IMMEDIATELY:  *FEVER GREATER THAN 100.5 F  *CHILLS WITH OR WITHOUT FEVER  NAUSEA AND VOMITING THAT IS NOT CONTROLLED WITH YOUR NAUSEA MEDICATION  *UNUSUAL SHORTNESS OF BREATH  *UNUSUAL BRUISING OR BLEEDING  TENDERNESS IN MOUTH AND THROAT WITH OR WITHOUT PRESENCE OF ULCERS  *URINARY PROBLEMS  *BOWEL PROBLEMS  UNUSUAL RASH Items with * indicate a potential emergency and should be followed up as soon as possible.  Feel free to call the clinic should you have any questions or concerns. The clinic phone number is [phone].  Please show the CHEMO ALERT CARD at check-in to the Emergency Department and triage nurse.  Coronavirus (COVID-19) Are you at risk?  Are you at risk for the Coronavirus (COVID-19)?  To be considered HIGH RISK for Coronavirus (COVID-19), you have to meet the following criteria:  . Traveled to [place], [place], [place], [place] or [place]; or in the [place] to [place], [place], [place], or [place]; and have fever, cough, and shortness of breath within the last 2 weeks of travel OR . Been in close contact with a person diagnosed with COVID-19 within the last 2 weeks and have fever, cough, and shortness of breath . IF YOU DO NOT MEET THESE CRITERIA, YOU ARE CONSIDERED LOW RISK FOR COVID-19.  What to do if you are HIGH RISK for COVID-19?  . If you are having a medical emergency, call 911. . Seek medical care right away. Before you go to a doctor's office, urgent care or emergency department, call ahead and tell them  about your recent travel, contact with someone diagnosed with COVID-19, and your symptoms. You should receive instructions from your physician's office regarding next steps of care.  . When you arrive at healthcare provider, tell the healthcare staff immediately you have returned from visiting [place], [place], [place], [place] or [place]; or traveled in the [place] to [place], [place], [place], or [place]; in the last two weeks or you have been in close contact with a person diagnosed with COVID-19 in the last 2 weeks.   . Tell the health care staff about your symptoms: fever, cough and shortness of breath. . After you have been seen by a medical provider, you will be either: o Tested for (COVID-19) and discharged home on quarantine except to seek medical care if symptoms worsen, and asked to  - Stay home and avoid contact with others until you get your results (4-5 days)  - Avoid travel on public transportation if possible (such as bus, train, or airplane) or o Sent to the Emergency Department by EMS for evaluation, COVID-19 testing, and possible admission depending on your condition and test results.  What to do if you are LOW RISK for COVID-19?  Reduce your risk of any infection by using the same precautions used for avoiding the common cold or flu:  . Wash your hands often with soap and warm water for at least 20 seconds.  If soap and water are not readily available, use an alcohol-based hand sanitizer with at least 60% alcohol.  . If coughing or   sneezing, cover your mouth and nose by coughing or sneezing into the elbow areas of your shirt or coat, into a tissue or into your sleeve (not your hands). . Avoid shaking hands with others and consider head nods or verbal greetings only. . Avoid touching your eyes, nose, or mouth with unwashed hands.  . Avoid close contact with people who are sick. . Avoid places or events with large numbers of people in one location, like concerts or  sporting events. . Carefully consider travel plans you have or are making. . If you are planning any travel outside or inside the [place], visit the CDC's Travelers' Health webpage for the latest health notices. . If you have some symptoms but not all symptoms, continue to monitor at home and seek medical attention if your symptoms worsen. . If you are having a medical emergency, call 911.   ADDITIONAL HEALTHCARE OPTIONS FOR PATIENTS  [facility] / e-Visit: [email or web address]         [facility]: [phone]  [facility]: [phone]                   [facility]: [phone]   

## 2019-01-21 ENCOUNTER — Other Ambulatory Visit: Payer: Self-pay | Admitting: Oncology

## 2019-01-22 ENCOUNTER — Inpatient Hospital Stay: Payer: BC Managed Care – PPO | Attending: Oncology

## 2019-01-22 ENCOUNTER — Inpatient Hospital Stay: Payer: BC Managed Care – PPO

## 2019-01-22 ENCOUNTER — Other Ambulatory Visit: Payer: Self-pay

## 2019-01-22 ENCOUNTER — Encounter: Payer: Self-pay | Admitting: Nurse Practitioner

## 2019-01-22 ENCOUNTER — Other Ambulatory Visit: Payer: BC Managed Care – PPO

## 2019-01-22 ENCOUNTER — Ambulatory Visit: Payer: BC Managed Care – PPO

## 2019-01-22 ENCOUNTER — Inpatient Hospital Stay (HOSPITAL_BASED_OUTPATIENT_CLINIC_OR_DEPARTMENT_OTHER): Payer: BC Managed Care – PPO | Admitting: Nurse Practitioner

## 2019-01-22 ENCOUNTER — Ambulatory Visit: Payer: BC Managed Care – PPO | Admitting: Oncology

## 2019-01-22 VITALS — BP 132/103 | HR 93 | Temp 99.4°F | Resp 18 | Ht 72.0 in | Wt 217.1 lb

## 2019-01-22 VITALS — BP 143/92 | HR 83 | Temp 98.3°F

## 2019-01-22 DIAGNOSIS — H538 Other visual disturbances: Secondary | ICD-10-CM | POA: Insufficient documentation

## 2019-01-22 DIAGNOSIS — C9000 Multiple myeloma not having achieved remission: Secondary | ICD-10-CM

## 2019-01-22 DIAGNOSIS — Z79899 Other long term (current) drug therapy: Secondary | ICD-10-CM | POA: Diagnosis not present

## 2019-01-22 DIAGNOSIS — Z86718 Personal history of other venous thrombosis and embolism: Secondary | ICD-10-CM | POA: Insufficient documentation

## 2019-01-22 DIAGNOSIS — Z5112 Encounter for antineoplastic immunotherapy: Secondary | ICD-10-CM | POA: Diagnosis present

## 2019-01-22 DIAGNOSIS — D649 Anemia, unspecified: Secondary | ICD-10-CM | POA: Diagnosis not present

## 2019-01-22 DIAGNOSIS — Z7982 Long term (current) use of aspirin: Secondary | ICD-10-CM | POA: Insufficient documentation

## 2019-01-22 LAB — CBC WITH DIFFERENTIAL (CANCER CENTER ONLY)
Abs Immature Granulocytes: 0.34 10*3/uL — ABNORMAL HIGH (ref 0.00–0.07)
Basophils Absolute: 0 10*3/uL (ref 0.0–0.1)
Basophils Relative: 1 %
Eosinophils Absolute: 0.1 10*3/uL (ref 0.0–0.5)
Eosinophils Relative: 1 %
HCT: 28.6 % — ABNORMAL LOW (ref 39.0–52.0)
Hemoglobin: 9.7 g/dL — ABNORMAL LOW (ref 13.0–17.0)
Immature Granulocytes: 5 %
Lymphocytes Relative: 28 %
Lymphs Abs: 2.1 10*3/uL (ref 0.7–4.0)
MCH: 29.2 pg (ref 26.0–34.0)
MCHC: 33.9 g/dL (ref 30.0–36.0)
MCV: 86.1 fL (ref 80.0–100.0)
Monocytes Absolute: 0.6 10*3/uL (ref 0.1–1.0)
Monocytes Relative: 8 %
Neutro Abs: 4.3 10*3/uL (ref 1.7–7.7)
Neutrophils Relative %: 57 %
Platelet Count: 206 10*3/uL (ref 150–400)
RBC: 3.32 MIL/uL — ABNORMAL LOW (ref 4.22–5.81)
RDW: 16.9 % — ABNORMAL HIGH (ref 11.5–15.5)
WBC Count: 7.5 10*3/uL (ref 4.0–10.5)
nRBC: 0.4 % — ABNORMAL HIGH (ref 0.0–0.2)

## 2019-01-22 LAB — CMP (CANCER CENTER ONLY)
ALT: 37 U/L (ref 0–44)
AST: 19 U/L (ref 15–41)
Albumin: 3.9 g/dL (ref 3.5–5.0)
Alkaline Phosphatase: 82 U/L (ref 38–126)
Anion gap: 12 (ref 5–15)
BUN: 12 mg/dL (ref 6–20)
CO2: 26 mmol/L (ref 22–32)
Calcium: 9.6 mg/dL (ref 8.9–10.3)
Chloride: 104 mmol/L (ref 98–111)
Creatinine: 1.28 mg/dL — ABNORMAL HIGH (ref 0.61–1.24)
GFR, Est AFR Am: >60 mL/min (ref >60)
GFR, Estimated: >60 mL/min (ref >60)
Glucose, Bld: 89 mg/dL (ref 70–99)
Potassium: 3.9 mmol/L (ref 3.5–5.1)
Sodium: 142 mmol/L (ref 135–145)
Total Bilirubin: 0.4 mg/dL (ref 0.3–1.2)
Total Protein: 6.8 g/dL (ref 6.5–8.1)

## 2019-01-22 MED ORDER — BORTEZOMIB CHEMO SQ INJECTION 3.5 MG (2.5MG/ML)
1.3000 mg/m2 | Freq: Once | INTRAMUSCULAR | Status: AC
  Administered 2019-01-22: 2.75 mg via SUBCUTANEOUS
  Filled 2019-01-22: qty 1.1

## 2019-01-22 MED ORDER — ONDANSETRON HCL 8 MG PO TABS
4.0000 mg | ORAL_TABLET | Freq: Once | ORAL | Status: AC
  Administered 2019-01-22: 4 mg via ORAL

## 2019-01-22 MED ORDER — DEXAMETHASONE 4 MG PO TABS
ORAL_TABLET | ORAL | Status: AC
  Filled 2019-01-22: qty 10

## 2019-01-22 MED ORDER — ONDANSETRON HCL 8 MG PO TABS
ORAL_TABLET | ORAL | Status: AC
  Filled 2019-01-22: qty 1

## 2019-01-22 MED ORDER — DEXAMETHASONE 4 MG PO TABS
40.0000 mg | ORAL_TABLET | Freq: Once | ORAL | Status: AC
  Administered 2019-01-22: 40 mg via ORAL
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
## 2019-01-22 NOTE — Patient Instructions (Signed)
[facility] Discharge Instructions for Patients Receiving Chemotherapy  Today you received the following chemotherapy agents Velcade To help prevent nausea and vomiting after your treatment, we encourage you to take your nausea medication as prescribed.   If you develop nausea and vomiting that is not controlled by your nausea medication, call the clinic.   BELOW ARE SYMPTOMS THAT SHOULD BE REPORTED IMMEDIATELY:  *FEVER GREATER THAN 100.5 F  *CHILLS WITH OR WITHOUT FEVER  NAUSEA AND VOMITING THAT IS NOT CONTROLLED WITH YOUR NAUSEA MEDICATION  *UNUSUAL SHORTNESS OF BREATH  *UNUSUAL BRUISING OR BLEEDING  TENDERNESS IN MOUTH AND THROAT WITH OR WITHOUT PRESENCE OF ULCERS  *URINARY PROBLEMS  *BOWEL PROBLEMS  UNUSUAL RASH Items with * indicate a potential emergency and should be followed up as soon as possible.  Feel free to call the clinic should you have any questions or concerns. The clinic phone number is [phone].  Please show the CHEMO ALERT CARD at check-in to the Emergency Department and triage nurse.   

## 2019-01-23 ENCOUNTER — Telehealth: Payer: Self-pay | Admitting: Oncology

## 2019-01-23 LAB — KAPPA/LAMBDA LIGHT CHAINS
Kappa free light chain: 4007.7 mg/L — ABNORMAL HIGH (ref 3.3–19.4)
Kappa, lambda light chain ratio: 646.4 — ABNORMAL HIGH (ref 0.26–1.65)
Lambda free light chains: 6.2 mg/L (ref 5.7–26.3)

## 2019-01-23 NOTE — Telephone Encounter (Signed)
Scheduled per los. Called and spoke with patient. Confirmed appt 

## 2019-01-24 ENCOUNTER — Encounter: Payer: Self-pay | Admitting: *Deleted

## 2019-01-28 ENCOUNTER — Other Ambulatory Visit: Payer: Self-pay | Admitting: Oncology

## 2019-01-29 ENCOUNTER — Ambulatory Visit: Payer: BC Managed Care – PPO

## 2019-01-29 ENCOUNTER — Other Ambulatory Visit: Payer: BC Managed Care – PPO

## 2019-01-30 ENCOUNTER — Inpatient Hospital Stay: Payer: BC Managed Care – PPO

## 2019-01-30 ENCOUNTER — Telehealth: Payer: Self-pay | Admitting: *Deleted

## 2019-01-30 ENCOUNTER — Other Ambulatory Visit: Payer: Self-pay | Admitting: *Deleted

## 2019-01-30 ENCOUNTER — Other Ambulatory Visit: Payer: Self-pay

## 2019-01-30 ENCOUNTER — Other Ambulatory Visit: Payer: Self-pay | Admitting: Nurse Practitioner

## 2019-01-30 VITALS — BP 134/97 | HR 97 | Temp 98.8°F | Resp 18

## 2019-01-30 DIAGNOSIS — C9000 Multiple myeloma not having achieved remission: Secondary | ICD-10-CM

## 2019-01-30 LAB — CBC WITH DIFFERENTIAL (CANCER CENTER ONLY)
Abs Immature Granulocytes: 0.33 10*3/uL — ABNORMAL HIGH (ref 0.00–0.07)
Basophils Absolute: 0.1 10*3/uL (ref 0.0–0.1)
Basophils Relative: 1 %
Eosinophils Absolute: 0.1 10*3/uL (ref 0.0–0.5)
Eosinophils Relative: 1 %
HCT: 30.1 % — ABNORMAL LOW (ref 39.0–52.0)
Hemoglobin: 10.3 g/dL — ABNORMAL LOW (ref 13.0–17.0)
Immature Granulocytes: 4 %
Lymphocytes Relative: 29 %
Lymphs Abs: 2.2 10*3/uL (ref 0.7–4.0)
MCH: 29.8 pg (ref 26.0–34.0)
MCHC: 34.2 g/dL (ref 30.0–36.0)
MCV: 87 fL (ref 80.0–100.0)
Monocytes Absolute: 0.7 10*3/uL (ref 0.1–1.0)
Monocytes Relative: 9 %
Neutro Abs: 4.1 10*3/uL (ref 1.7–7.7)
Neutrophils Relative %: 56 %
Platelet Count: 218 10*3/uL (ref 150–400)
RBC: 3.46 MIL/uL — ABNORMAL LOW (ref 4.22–5.81)
RDW: 16.9 % — ABNORMAL HIGH (ref 11.5–15.5)
WBC Count: 7.5 10*3/uL (ref 4.0–10.5)
nRBC: 0.4 % — ABNORMAL HIGH (ref 0.0–0.2)

## 2019-01-30 LAB — CMP (CANCER CENTER ONLY)
ALT: 36 U/L (ref 0–44)
AST: 17 U/L (ref 15–41)
Albumin: 4.2 g/dL (ref 3.5–5.0)
Alkaline Phosphatase: 78 U/L (ref 38–126)
Anion gap: 12 (ref 5–15)
BUN: 13 mg/dL (ref 6–20)
CO2: 26 mmol/L (ref 22–32)
Calcium: 9.6 mg/dL (ref 8.9–10.3)
Chloride: 104 mmol/L (ref 98–111)
Creatinine: 1.37 mg/dL — ABNORMAL HIGH (ref 0.61–1.24)
GFR, Est AFR Am: >60 mL/min
GFR, Estimated: >60 mL/min
Glucose, Bld: 104 mg/dL — ABNORMAL HIGH (ref 70–99)
Potassium: 4.1 mmol/L (ref 3.5–5.1)
Sodium: 142 mmol/L (ref 135–145)
Total Bilirubin: 0.4 mg/dL (ref 0.3–1.2)
Total Protein: 7.2 g/dL (ref 6.5–8.1)

## 2019-01-30 MED ORDER — DEXAMETHASONE 4 MG PO TABS
40.0000 mg | ORAL_TABLET | Freq: Once | ORAL | Status: AC
  Administered 2019-01-30: 40 mg via ORAL

## 2019-01-30 MED ORDER — DEXAMETHASONE 4 MG PO TABS
ORAL_TABLET | ORAL | Status: AC
  Filled 2019-01-30: qty 10

## 2019-01-30 MED ORDER — CYCLOBENZAPRINE HCL 5 MG PO TABS: 5.0000 mg | ORAL_TABLET | Freq: Three times a day (TID) | ORAL | 0 refills | Status: DC | PRN

## 2019-01-30 MED ORDER — ONDANSETRON HCL 8 MG PO TABS
ORAL_TABLET | ORAL | Status: AC
  Filled 2019-01-30: qty 1

## 2019-01-30 MED ORDER — ONDANSETRON HCL 8 MG PO TABS
4.0000 mg | ORAL_TABLET | Freq: Once | ORAL | Status: AC
  Administered 2019-01-30: 4 mg via ORAL

## 2019-01-30 MED ORDER — BORTEZOMIB CHEMO SQ INJECTION 3.5 MG (2.5MG/ML)
1.3000 mg/m2 | Freq: Once | INTRAMUSCULAR | Status: AC
  Administered 2019-01-30: 2.75 mg via SUBCUTANEOUS
  Filled 2019-01-30: qty 1.1

## 2019-01-30 NOTE — Telephone Encounter (Signed)
Patient in infusion area with questions/issues to discuss with nurse. 1. FMLA ends 02/25/19--is not able to return to work anytime soon due to treatment, activity level and pending BMT. Instructed him to have FMLA form sent again and and extension can be done. Provided email address to send forms. 2. Asking why the cytoxan was discontinued? 3. Has been having difficulty obtaining and maintaining an erection and asking if MD will order medication for this?

## 2019-01-30 NOTE — Patient Instructions (Signed)
[facility] Discharge Instructions for Patients Receiving Chemotherapy  Today you received the following chemotherapy agents Velcade To help prevent nausea and vomiting after your treatment, we encourage you to take your nausea medication as prescribed.   If you develop nausea and vomiting that is not controlled by your nausea medication, call the clinic.   BELOW ARE SYMPTOMS THAT SHOULD BE REPORTED IMMEDIATELY:  *FEVER GREATER THAN 100.5 F  *CHILLS WITH OR WITHOUT FEVER  NAUSEA AND VOMITING THAT IS NOT CONTROLLED WITH YOUR NAUSEA MEDICATION  *UNUSUAL SHORTNESS OF BREATH  *UNUSUAL BRUISING OR BLEEDING  TENDERNESS IN MOUTH AND THROAT WITH OR WITHOUT PRESENCE OF ULCERS  *URINARY PROBLEMS  *BOWEL PROBLEMS  UNUSUAL RASH Items with * indicate a potential emergency and should be followed up as soon as possible.  Feel free to call the clinic should you have any questions or concerns. The clinic phone number is [phone].  Please show the CHEMO ALERT CARD at check-in to the Emergency Department and triage nurse.   

## 2019-02-04 ENCOUNTER — Other Ambulatory Visit: Payer: Self-pay | Admitting: Oncology

## 2019-02-06 ENCOUNTER — Other Ambulatory Visit: Payer: BC Managed Care – PPO

## 2019-02-06 ENCOUNTER — Ambulatory Visit: Payer: BC Managed Care – PPO | Admitting: Nurse Practitioner

## 2019-02-06 ENCOUNTER — Inpatient Hospital Stay (HOSPITAL_BASED_OUTPATIENT_CLINIC_OR_DEPARTMENT_OTHER): Payer: BC Managed Care – PPO | Admitting: Nurse Practitioner

## 2019-02-06 ENCOUNTER — Inpatient Hospital Stay: Payer: BC Managed Care – PPO

## 2019-02-06 ENCOUNTER — Other Ambulatory Visit: Payer: Self-pay

## 2019-02-06 ENCOUNTER — Telehealth: Payer: Self-pay | Admitting: Pharmacist

## 2019-02-06 ENCOUNTER — Encounter: Payer: Self-pay | Admitting: Nurse Practitioner

## 2019-02-06 ENCOUNTER — Ambulatory Visit: Payer: BC Managed Care – PPO

## 2019-02-06 VITALS — BP 147/97 | HR 104 | Temp 98.0°F | Resp 20 | Ht 72.0 in | Wt 215.8 lb

## 2019-02-06 DIAGNOSIS — C9000 Multiple myeloma not having achieved remission: Secondary | ICD-10-CM

## 2019-02-06 LAB — CMP (CANCER CENTER ONLY)
ALT: 22 U/L (ref 0–44)
AST: 13 U/L — ABNORMAL LOW (ref 15–41)
Albumin: 4.3 g/dL (ref 3.5–5.0)
Alkaline Phosphatase: 77 U/L (ref 38–126)
Anion gap: 14 (ref 5–15)
BUN: 13 mg/dL (ref 6–20)
CO2: 24 mmol/L (ref 22–32)
Calcium: 9.9 mg/dL (ref 8.9–10.3)
Chloride: 102 mmol/L (ref 98–111)
Creatinine: 1.48 mg/dL — ABNORMAL HIGH (ref 0.61–1.24)
GFR, Est AFR Am: >60 mL/min (ref >60)
GFR, Estimated: 56 mL/min — ABNORMAL LOW (ref >60)
Glucose, Bld: 105 mg/dL — ABNORMAL HIGH (ref 70–99)
Potassium: 4.1 mmol/L (ref 3.5–5.1)
Sodium: 140 mmol/L (ref 135–145)
Total Bilirubin: 0.5 mg/dL (ref 0.3–1.2)
Total Protein: 7.5 g/dL (ref 6.5–8.1)

## 2019-02-06 LAB — CBC WITH DIFFERENTIAL (CANCER CENTER ONLY)
Abs Immature Granulocytes: 0.4 10*3/uL — ABNORMAL HIGH (ref 0.00–0.07)
Basophils Absolute: 0.1 10*3/uL (ref 0.0–0.1)
Basophils Relative: 1 %
Eosinophils Absolute: 0.1 10*3/uL (ref 0.0–0.5)
Eosinophils Relative: 1 %
HCT: 31.6 % — ABNORMAL LOW (ref 39.0–52.0)
Hemoglobin: 10.6 g/dL — ABNORMAL LOW (ref 13.0–17.0)
Immature Granulocytes: 5 %
Lymphocytes Relative: 29 %
Lymphs Abs: 2.6 10*3/uL (ref 0.7–4.0)
MCH: 29 pg (ref 26.0–34.0)
MCHC: 33.5 g/dL (ref 30.0–36.0)
MCV: 86.3 fL (ref 80.0–100.0)
Monocytes Absolute: 0.8 10*3/uL (ref 0.1–1.0)
Monocytes Relative: 9 %
Neutro Abs: 5 10*3/uL (ref 1.7–7.7)
Neutrophils Relative %: 55 %
Platelet Count: 233 10*3/uL (ref 150–400)
RBC: 3.66 MIL/uL — ABNORMAL LOW (ref 4.22–5.81)
RDW: 16.5 % — ABNORMAL HIGH (ref 11.5–15.5)
WBC Count: 8.9 10*3/uL (ref 4.0–10.5)
nRBC: 0.2 % (ref 0.0–0.2)

## 2019-02-06 NOTE — Telephone Encounter (Signed)
Oral Chemotherapy Pharmacist Encounter   I spoke with patient in exam room for overview of: Revlimid (lenlidomide) for the treatment of multiple myeloma not having achieved remission in conjunction with bortezomib and dexamethasone, planned duration 2 cycles prior to autologous stem cell transplant.  Patient was originally started on induction treatment with CyBorD on 12/05/18 Plan is to start patient at Revlimid 15 mg by mouth once daily for 21 days on, 7 days off, repeated every 28 days for cycle 1 and will try to push dose to 25 mg daily on same cycle schedule for cycle 2 if blood counts can tolerate.   Counseled patient on administration, dosing, side effects, monitoring, drug-food interactions, safe handling, storage, and disposal.  Patient will take Revlimid 86m capsules, 1 capsule by mouth once daily, without regard to food, with a full glass of water.  Revlimid will be given 21 days on, 7 days off, repeat every 28 days.  Patient informed of possible Revlimid dose increase.  Revlimid start date: 02/12/2019  Adverse effects of Revlimid include but are not limited to: nausea, constipation, diarrhea, abdominal pain, rash, fatigue, drug fever, and decreased blood counts.    Reviewed with patient importance of keeping a medication schedule and plan for any missed doses.  Medication reconciliation performed and medication/allergy list updated.  Patient remains on acyclovir for VZV prophylaxis. Patient counseled on importance of daily aspirin 856mfor VTE prophylaxis.  Insurance authorization for Revlimid will be submitted.  Prescription for Revlimid will be e-scribed to appropriate specialty pharmacy for dispensing once insurance authorization is approved. Revlimid is not available for dispensing at the [facility] it is a limited distribution medication.  Patient informed he will be eligible for a manufacturer copayment coupon that will reduce his out of  pocket expense for Revlimid to $25 per fill if it is not fully covered by his insurance.  I will follow-up with patient about dispensing pharmacy.  All questions answered.  Mr. MiObeyoiced understanding and appreciation.   Patient knows to call the office with questions or concerns.  JeJohny DrillingPharmD, BCPS, BCOP  02/06/2019    3:59 PM Oral Oncology Clinic [phone]
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
## 2019-02-06 NOTE — Telephone Encounter (Signed)
Oral Oncology Pharmacist Encounter  Received new referral for Revlimid (lenlidomide) for the treatment of multiple myeloma not having achieved remission in conjunction with bortezomib and dexamethasone, planned duration 2 cycles prior to autologous stem cell transplant.  Patient was originally started on induction treatment with CyBorD on 12/05/18 Plan is to start patient at Revlimid 15 mg by mouth once daily for 21 days on, 7 days off, repeated every 28 days for cycle 1 and will try to push dose to 25 mg daily on same cycle schedule for cycle 2 if blood counts can tolerate.  Revlimid start date planned for 02/12/19  Labs from 02/06/2019 assessed, OK for treatment initation. SCr=1.48, est CrCl ~ 70 mL/min  Current medication list in Epic reviewed, no DDIs with lenalidomide identified. Patient continues on acyclovir 400 mg BID for VZV prophylaxis. He will start aspirin 81 mg once daily for thromboprophylxis. Importance of both of these supportive care medications reviewed with patient.  Prescription for Revlimid will be e-scribed to appropriate specialty pharmacy for dispensing once insurance authorization is approved. Revlimid is not available for dispensing at the [place] outpatient pharmacy as it is a limited distribution medication.  Oral Oncology Clinic will continue to follow for insurance authorization, copayment issues, initial counseling and start date.  Jesse Mack, PharmD, BCPS, BCOP  02/06/2019 3:28 PM Oral Oncology Clinic [phone] 

## 2019-02-07 ENCOUNTER — Telehealth: Payer: Self-pay | Admitting: Oncology

## 2019-02-07 ENCOUNTER — Telehealth: Payer: Self-pay | Admitting: Pharmacist

## 2019-02-07 ENCOUNTER — Other Ambulatory Visit: Payer: Self-pay | Admitting: *Deleted

## 2019-02-07 DIAGNOSIS — C9000 Multiple myeloma not having achieved remission: Secondary | ICD-10-CM

## 2019-02-07 MED ORDER — LENALIDOMIDE 15 MG PO CAPS: ORAL_CAPSULE | ORAL | 0 refills | Status: DC

## 2019-02-07 MED ORDER — LENALIDOMIDE 15 MG PO CAPS: 15.0000 mg | ORAL_CAPSULE | Freq: Every day | ORAL | 0 refills | Status: DC

## 2019-02-07 NOTE — Telephone Encounter (Signed)
Oral Oncology Pharmacist Encounter  Insurance authorization for Revlimid has been obtained. [employer] authorization for 1st prescription for Revlimid has been obtained.  Revlimid 15 mg capsule prescription hwas e-scribed to [facility] pharmacy earlier today.  I received a call from [facility] stating this medication must be filled at [place] per insurance requirement. Prescription is now e-scribed to [facility].  Supporting information including insurance information, demographic information, and medication list has been faxed to dispensing pharmacy.   I will reach out to the patient to discuss site of dispensing once I confirm pharmacy is able to process patient's claim.  Johny Drilling, PharmD, BCPS, BCOP  02/07/2019 3:47 PM  Oral Oncology Clinic [phone]

## 2019-02-07 NOTE — Telephone Encounter (Signed)
Oral Oncology Pharmacist Encounter  Insurance authorization for Revlimid has been obtained. [employer] authorization for 1st prescription for Revlimid has been obtained.  Revlimid 15 mg capsule prescription has been e-scribed to [facility] specialty pharmacy.  Supporting information including insurance information, demographic information, and medication list has been faxed to dispensing pharmacy.   I will reach out to the patient to discuss site of dispensing once I confirm pharmacy is able to process patient's claim.  Johny Drilling, PharmD, BCPS, BCOP  02/07/2019 1:11 PM Oral Oncology Clinic [phone]

## 2019-02-07 NOTE — Telephone Encounter (Signed)
Oral Oncology Pharmacist Encounter  Insurance authorization for Revlimid (lenalidomide) capsules submitted over the phone at [phone] to [employer] prescription insurance  Case ID: [id number] Status: approved Effective dates: 01/08/2019 - 02/06/2022  Johny Drilling, PharmD, BCPS, BCOP  02/07/2019   1:09 PM [place] [facility] [phone]

## 2019-02-07 NOTE — Telephone Encounter (Signed)
Scheduled per los. Called and spoke with patient. Confirmed appts  

## 2019-02-07 NOTE — Addendum Note (Signed)
Addended by: Enis Gash on: 02/07/2019 03:48 PM   Modules accepted: Orders

## 2019-02-07 NOTE — Telephone Encounter (Signed)
error 

## 2019-02-07 NOTE — Telephone Encounter (Signed)
Oral Oncology Pharmacist Encounter  Insurance authorization for Revlimid has been obtained. [employer] authorization for 1st prescription for Revlimid has been obtained.  Revlimid 15 mg capsule prescription hwas e-scribed to [facility] specialty pharmacy earlier today.  I received a call from [facility] stating this medication must be filled at [place] per insurance requirement. Prescription is now e-scribed to [facility].  Supporting information including insurance information, demographic information, and medication list has been faxed to dispensing pharmacy.   I will reach out to the patient to discuss site of dispensing once I confirm pharmacy is able to process patient's claim.  Johny Drilling, PharmD, BCPS, BCOP  02/07/2019 3:47 PM  Oral Oncology Clinic [phone]

## 2019-02-08 NOTE — Telephone Encounter (Signed)
Oral Oncology Pharmacist Encounter  I called [facility] at [phone] this morning to follow up on status of Revlimid prescription. Prescription is in process. They did receive the faxed demographic info, insurance info, and medication list that I sent yesterday. I informed representative that patient is planned to start on Monday, 02/12/19, and she has marked the prescription for an urgent need.  I called patient and informed him of dispensing pharmacy.  I will follow-up with [facility] again tomorrow morning to ensure timely dispensing of Revlimid. Patient informed I will update him with any delays.  Patient states he has no additional questions at this time.  Johny Drilling, PharmD, BCPS, BCOP  02/08/2019 11:14 AM Oral Oncology Clinic [phone]

## 2019-02-09 ENCOUNTER — Other Ambulatory Visit: Payer: Self-pay | Admitting: Nurse Practitioner

## 2019-02-09 DIAGNOSIS — C9000 Multiple myeloma not having achieved remission: Secondary | ICD-10-CM

## 2019-02-09 NOTE — Telephone Encounter (Signed)
Oral Oncology Pharmacist Encounter  Received call from [place]. They are ready to schedule Revlimid shipment. They have left a voicemail for patient to call back. If he calls by 5pm EST, they can ship out today. I called and spoke with patient. Informed him to call [facility] at [phone] to schedule his Revlimid. He stated that he would.  Johny Drilling, PharmD, BCPS, BCOP  02/09/2019 3:07 PM Oral Oncology Clinic [phone]

## 2019-02-09 NOTE — Telephone Encounter (Signed)
Oral Oncology Pharmacist Encounter  I called [place] again at [phone] this morning to follow up on status of Revlimid prescription. Prescription is still in process. Possibly late stages. It is possible they will call patient today to schedule shipment. Prescription again marked as urgent. Representative informed start date is planned for 02/12/19.  Johny Drilling, PharmD, BCPS, BCOP  02/09/2019 1:40 PM Oral Oncology Clinic [phone]

## 2019-02-11 ENCOUNTER — Other Ambulatory Visit: Payer: Self-pay | Admitting: Oncology

## 2019-02-12 ENCOUNTER — Inpatient Hospital Stay: Payer: BC Managed Care – PPO

## 2019-02-12 ENCOUNTER — Other Ambulatory Visit: Payer: Self-pay

## 2019-02-12 VITALS — BP 151/95 | HR 89 | Temp 98.5°F | Resp 16

## 2019-02-12 DIAGNOSIS — C9000 Multiple myeloma not having achieved remission: Secondary | ICD-10-CM

## 2019-02-12 LAB — CMP (CANCER CENTER ONLY)
ALT: 18 U/L (ref 0–44)
AST: 15 U/L (ref 15–41)
Albumin: 4.3 g/dL (ref 3.5–5.0)
Alkaline Phosphatase: 74 U/L (ref 38–126)
Anion gap: 14 (ref 5–15)
BUN: 13 mg/dL (ref 6–20)
CO2: 24 mmol/L (ref 22–32)
Calcium: 9.9 mg/dL (ref 8.9–10.3)
Chloride: 104 mmol/L (ref 98–111)
Creatinine: 1.41 mg/dL — ABNORMAL HIGH (ref 0.61–1.24)
GFR, Est AFR Am: >60 mL/min (ref >60)
GFR, Estimated: 60 mL/min — ABNORMAL LOW (ref >60)
Glucose, Bld: 100 mg/dL — ABNORMAL HIGH (ref 70–99)
Potassium: 4 mmol/L (ref 3.5–5.1)
Sodium: 142 mmol/L (ref 135–145)
Total Bilirubin: 0.5 mg/dL (ref 0.3–1.2)
Total Protein: 7.3 g/dL (ref 6.5–8.1)

## 2019-02-12 LAB — CBC WITH DIFFERENTIAL (CANCER CENTER ONLY)
Abs Immature Granulocytes: 0.35 10*3/uL — ABNORMAL HIGH (ref 0.00–0.07)
Basophils Absolute: 0.1 10*3/uL (ref 0.0–0.1)
Basophils Relative: 1 %
Eosinophils Absolute: 0.1 10*3/uL (ref 0.0–0.5)
Eosinophils Relative: 1 %
HCT: 30.4 % — ABNORMAL LOW (ref 39.0–52.0)
Hemoglobin: 10.5 g/dL — ABNORMAL LOW (ref 13.0–17.0)
Immature Granulocytes: 4 %
Lymphocytes Relative: 27 %
Lymphs Abs: 2.3 10*3/uL (ref 0.7–4.0)
MCH: 29.3 pg (ref 26.0–34.0)
MCHC: 34.5 g/dL (ref 30.0–36.0)
MCV: 84.9 fL (ref 80.0–100.0)
Monocytes Absolute: 0.9 10*3/uL (ref 0.1–1.0)
Monocytes Relative: 10 %
Neutro Abs: 5 10*3/uL (ref 1.7–7.7)
Neutrophils Relative %: 57 %
Platelet Count: 273 10*3/uL (ref 150–400)
RBC: 3.58 MIL/uL — ABNORMAL LOW (ref 4.22–5.81)
RDW: 15.9 % — ABNORMAL HIGH (ref 11.5–15.5)
WBC Count: 8.7 10*3/uL (ref 4.0–10.5)
nRBC: 0.5 % — ABNORMAL HIGH (ref 0.0–0.2)

## 2019-02-12 MED ORDER — ONDANSETRON HCL 8 MG PO TABS
4.0000 mg | ORAL_TABLET | Freq: Once | ORAL | Status: AC
  Administered 2019-02-12: 4 mg via ORAL

## 2019-02-12 MED ORDER — BORTEZOMIB CHEMO SQ INJECTION 3.5 MG (2.5MG/ML)
1.3000 mg/m2 | Freq: Once | INTRAMUSCULAR | Status: AC
  Administered 2019-02-12: 2.75 mg via SUBCUTANEOUS
  Filled 2019-02-12: qty 1.1

## 2019-02-12 MED ORDER — ONDANSETRON HCL 8 MG PO TABS
ORAL_TABLET | ORAL | Status: AC
  Filled 2019-02-12: qty 1

## 2019-02-12 MED ORDER — DEXAMETHASONE 4 MG PO TABS
40.0000 mg | ORAL_TABLET | Freq: Once | ORAL | Status: AC
  Administered 2019-02-12: 40 mg via ORAL

## 2019-02-12 MED ORDER — DEXAMETHASONE 4 MG PO TABS
ORAL_TABLET | ORAL | Status: AC
  Filled 2019-02-12: qty 10

## 2019-02-12 NOTE — Patient Instructions (Signed)
[place] Discharge Instructions for Patients Receiving Chemotherapy  Today you received the following chemotherapy agents:  bortezomib  To help prevent nausea and vomiting after your treatment, we encourage you to take your nausea medication as prescribed.   If you develop nausea and vomiting that is not controlled by your nausea medication, call the clinic.   BELOW ARE SYMPTOMS THAT SHOULD BE REPORTED IMMEDIATELY:  *FEVER GREATER THAN 100.5 F  *CHILLS WITH OR WITHOUT FEVER  NAUSEA AND VOMITING THAT IS NOT CONTROLLED WITH YOUR NAUSEA MEDICATION  *UNUSUAL SHORTNESS OF BREATH  *UNUSUAL BRUISING OR BLEEDING  TENDERNESS IN MOUTH AND THROAT WITH OR WITHOUT PRESENCE OF ULCERS  *URINARY PROBLEMS  *BOWEL PROBLEMS  UNUSUAL RASH Items with * indicate a potential emergency and should be followed up as soon as possible.  Feel free to call the clinic should you have any questions or concerns. The clinic phone number is [phone].  Please show the [place] at check-in to the Emergency Department and triage nurse.

## 2019-02-14 ENCOUNTER — Telehealth: Payer: Self-pay | Admitting: Family Medicine

## 2019-02-14 NOTE — Telephone Encounter (Signed)
Received call from Derek [name] asking for a call back concerning patient's paperwork for disability. [name] said she left a message for a call back. The number to contact [name] is [phone]

## 2019-02-19 ENCOUNTER — Other Ambulatory Visit: Payer: Self-pay | Admitting: *Deleted

## 2019-02-19 DIAGNOSIS — C9000 Multiple myeloma not having achieved remission: Secondary | ICD-10-CM

## 2019-02-19 NOTE — Telephone Encounter (Signed)
I called Derek [name] back. She said [employer] was waiting on [place] to send back a form stating the first date Dr. Junius Roads put the patient out of work. I advised her we have not received this form. She will bring a copy of it so that we can fill this section out.

## 2019-02-20 ENCOUNTER — Inpatient Hospital Stay: Payer: BC Managed Care – PPO | Attending: Oncology | Admitting: Nurse Practitioner

## 2019-02-20 ENCOUNTER — Other Ambulatory Visit: Payer: Self-pay

## 2019-02-20 ENCOUNTER — Inpatient Hospital Stay: Payer: BC Managed Care – PPO

## 2019-02-20 ENCOUNTER — Encounter: Payer: Self-pay | Admitting: Nurse Practitioner

## 2019-02-20 VITALS — BP 136/101 | HR 99 | Temp 98.5°F | Resp 18 | Ht 73.0 in | Wt 217.4 lb

## 2019-02-20 VITALS — BP 151/94

## 2019-02-20 DIAGNOSIS — C9000 Multiple myeloma not having achieved remission: Secondary | ICD-10-CM

## 2019-02-20 DIAGNOSIS — Z5112 Encounter for antineoplastic immunotherapy: Secondary | ICD-10-CM | POA: Insufficient documentation

## 2019-02-20 DIAGNOSIS — Z79899 Other long term (current) drug therapy: Secondary | ICD-10-CM | POA: Insufficient documentation

## 2019-02-20 DIAGNOSIS — G2581 Restless legs syndrome: Secondary | ICD-10-CM | POA: Diagnosis not present

## 2019-02-20 DIAGNOSIS — D649 Anemia, unspecified: Secondary | ICD-10-CM | POA: Diagnosis not present

## 2019-02-20 LAB — CBC WITH DIFFERENTIAL (CANCER CENTER ONLY)
Abs Immature Granulocytes: 0.38 10*3/uL — ABNORMAL HIGH (ref 0.00–0.07)
Basophils Absolute: 0.1 10*3/uL (ref 0.0–0.1)
Basophils Relative: 1 %
Eosinophils Absolute: 0.2 10*3/uL (ref 0.0–0.5)
Eosinophils Relative: 2 %
HCT: 32.9 % — ABNORMAL LOW (ref 39.0–52.0)
Hemoglobin: 11 g/dL — ABNORMAL LOW (ref 13.0–17.0)
Immature Granulocytes: 6 %
Lymphocytes Relative: 25 %
Lymphs Abs: 1.7 10*3/uL (ref 0.7–4.0)
MCH: 28.8 pg (ref 26.0–34.0)
MCHC: 33.4 g/dL (ref 30.0–36.0)
MCV: 86.1 fL (ref 80.0–100.0)
Monocytes Absolute: 0.5 10*3/uL (ref 0.1–1.0)
Monocytes Relative: 7 %
Neutro Abs: 3.9 10*3/uL (ref 1.7–7.7)
Neutrophils Relative %: 59 %
Platelet Count: 249 10*3/uL (ref 150–400)
RBC: 3.82 MIL/uL — ABNORMAL LOW (ref 4.22–5.81)
RDW: 15.4 % (ref 11.5–15.5)
WBC Count: 6.6 10*3/uL (ref 4.0–10.5)
nRBC: 0.5 % — ABNORMAL HIGH (ref 0.0–0.2)

## 2019-02-20 LAB — CMP (CANCER CENTER ONLY)
ALT: 32 U/L (ref 0–44)
AST: 18 U/L (ref 15–41)
Albumin: 4.5 g/dL (ref 3.5–5.0)
Alkaline Phosphatase: 73 U/L (ref 38–126)
Anion gap: 12 (ref 5–15)
BUN: 14 mg/dL (ref 6–20)
CO2: 26 mmol/L (ref 22–32)
Calcium: 11.2 mg/dL — ABNORMAL HIGH (ref 8.9–10.3)
Chloride: 100 mmol/L (ref 98–111)
Creatinine: 1.35 mg/dL — ABNORMAL HIGH (ref 0.61–1.24)
GFR, Est AFR Am: >60 mL/min (ref >60)
GFR, Estimated: >60 mL/min (ref >60)
Glucose, Bld: 87 mg/dL (ref 70–99)
Potassium: 3.9 mmol/L (ref 3.5–5.1)
Sodium: 138 mmol/L (ref 135–145)
Total Bilirubin: 0.7 mg/dL (ref 0.3–1.2)
Total Protein: 7.8 g/dL (ref 6.5–8.1)

## 2019-02-20 MED ORDER — ONDANSETRON HCL 8 MG PO TABS
4.0000 mg | ORAL_TABLET | Freq: Once | ORAL | Status: AC
  Administered 2019-02-20: 4 mg via ORAL

## 2019-02-20 MED ORDER — BORTEZOMIB CHEMO SQ INJECTION 3.5 MG (2.5MG/ML)
1.3000 mg/m2 | Freq: Once | INTRAMUSCULAR | Status: AC
  Administered 2019-02-20: 2.75 mg via SUBCUTANEOUS
  Filled 2019-02-20: qty 1.1

## 2019-02-20 MED ORDER — ONDANSETRON HCL 8 MG PO TABS
ORAL_TABLET | ORAL | Status: AC
  Filled 2019-02-20: qty 1

## 2019-02-20 MED ORDER — ZOLEDRONIC ACID 4 MG/5ML IV CONC: 4.0000 mg | Freq: Once | INTRAVENOUS | Status: DC

## 2019-02-20 MED ORDER — DEXAMETHASONE 4 MG PO TABS
40.0000 mg | ORAL_TABLET | Freq: Once | ORAL | Status: AC
  Administered 2019-02-20: 40 mg via ORAL

## 2019-02-20 MED ORDER — DEXAMETHASONE 4 MG PO TABS
ORAL_TABLET | ORAL | Status: AC
  Filled 2019-02-20: qty 10

## 2019-02-20 MED ORDER — ZOLEDRONIC ACID 4 MG/100ML IV SOLN: 4.0000 mg | Freq: Once | INTRAVENOUS | Status: DC

## 2019-02-20 NOTE — Progress Notes (Signed)
Patient refused administration of Zometa this appointment. States he will discuss with physician at next treatment.
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
## 2019-02-20 NOTE — Patient Instructions (Signed)
[facility] Discharge Instructions for Patients Receiving Chemotherapy  Today you received the following chemotherapy agents Velcade.  To help prevent nausea and vomiting after your treatment, we encourage you to take your nausea medication as directed.  If you develop nausea and vomiting that is not controlled by your nausea medication, call the clinic.   BELOW ARE SYMPTOMS THAT SHOULD BE REPORTED IMMEDIATELY:  *FEVER GREATER THAN 100.5 F  *CHILLS WITH OR WITHOUT FEVER  NAUSEA AND VOMITING THAT IS NOT CONTROLLED WITH YOUR NAUSEA MEDICATION  *UNUSUAL SHORTNESS OF BREATH  *UNUSUAL BRUISING OR BLEEDING  TENDERNESS IN MOUTH AND THROAT WITH OR WITHOUT PRESENCE OF ULCERS  *URINARY PROBLEMS  *BOWEL PROBLEMS  UNUSUAL RASH Items with * indicate a potential emergency and should be followed up as soon as possible.  Feel free to call the clinic should you have any questions or concerns. The clinic phone number is [phone].  Please show the CHEMO ALERT CARD at check-in to the Emergency Department and triage nurse.   

## 2019-02-20 NOTE — Telephone Encounter (Signed)
Office notes and out of work notes were faxed to [employer] with [employer] [phone]) on 02/19/19. Confirmation received. [employer] faxed the restrictions form back to [place] today. This was filled out and faxed to Seth Bake today - confirmation at 3:45. The packet that [employer] sends to the patient for payment for filling out this type of form was already put in the mail. I advised [name] to just ignore this, as we have taken care of the form in the office.

## 2019-02-21 ENCOUNTER — Telehealth: Payer: Self-pay | Admitting: *Deleted

## 2019-02-21 DIAGNOSIS — C9000 Multiple myeloma not having achieved remission: Secondary | ICD-10-CM

## 2019-02-21 MED ORDER — CYCLOBENZAPRINE HCL 5 MG PO TABS: 5.0000 mg | ORAL_TABLET | Freq: Three times a day (TID) | ORAL | 0 refills | Status: DC | PRN

## 2019-02-21 MED ORDER — METOPROLOL TARTRATE 25 MG PO TABS: 25.0000 mg | ORAL_TABLET | Freq: Two times a day (BID) | ORAL | 1 refills | Status: DC

## 2019-02-21 MED ORDER — ALPRAZOLAM 0.25 MG PO TABS: 0.2500 mg | ORAL_TABLET | Freq: Two times a day (BID) | ORAL | 0 refills | Status: DC | PRN

## 2019-02-21 NOTE — Telephone Encounter (Signed)
Called to confirm we received the papers from his FMLA department to extend his FMLA out beginning 02/25/19. Forms placed in inbox for disability/fmla RN to pick up. He is also requesting refill on metoprolol, xanax, and flexeril. OK per Dr. Benay Spice.

## 2019-02-25 ENCOUNTER — Other Ambulatory Visit: Payer: Self-pay | Admitting: Oncology

## 2019-02-26 ENCOUNTER — Inpatient Hospital Stay: Payer: BC Managed Care – PPO

## 2019-02-26 ENCOUNTER — Other Ambulatory Visit: Payer: Self-pay

## 2019-02-26 VITALS — BP 133/98 | HR 99 | Temp 98.0°F | Resp 18

## 2019-02-26 DIAGNOSIS — C9000 Multiple myeloma not having achieved remission: Secondary | ICD-10-CM | POA: Diagnosis not present

## 2019-02-26 LAB — CBC WITH DIFFERENTIAL (CANCER CENTER ONLY)
Abs Immature Granulocytes: 0.18 10*3/uL — ABNORMAL HIGH (ref 0.00–0.07)
Basophils Absolute: 0.1 10*3/uL (ref 0.0–0.1)
Basophils Relative: 1 %
Eosinophils Absolute: 0.1 10*3/uL (ref 0.0–0.5)
Eosinophils Relative: 1 %
HCT: 32.4 % — ABNORMAL LOW (ref 39.0–52.0)
Hemoglobin: 11.3 g/dL — ABNORMAL LOW (ref 13.0–17.0)
Immature Granulocytes: 2 %
Lymphocytes Relative: 24 %
Lymphs Abs: 2.3 10*3/uL (ref 0.7–4.0)
MCH: 29.6 pg (ref 26.0–34.0)
MCHC: 34.9 g/dL (ref 30.0–36.0)
MCV: 84.8 fL (ref 80.0–100.0)
Monocytes Absolute: 0.7 10*3/uL (ref 0.1–1.0)
Monocytes Relative: 8 %
Neutro Abs: 6.2 10*3/uL (ref 1.7–7.7)
Neutrophils Relative %: 64 %
Platelet Count: 276 10*3/uL (ref 150–400)
RBC: 3.82 MIL/uL — ABNORMAL LOW (ref 4.22–5.81)
RDW: 15.1 % (ref 11.5–15.5)
WBC Count: 9.6 10*3/uL (ref 4.0–10.5)
nRBC: 0.2 % (ref 0.0–0.2)

## 2019-02-26 LAB — CMP (CANCER CENTER ONLY)
ALT: 32 U/L (ref 0–44)
AST: 18 U/L (ref 15–41)
Albumin: 4.3 g/dL (ref 3.5–5.0)
Alkaline Phosphatase: 71 U/L (ref 38–126)
Anion gap: 14 (ref 5–15)
BUN: 12 mg/dL (ref 6–20)
CO2: 21 mmol/L — ABNORMAL LOW (ref 22–32)
Calcium: 9.9 mg/dL (ref 8.9–10.3)
Chloride: 106 mmol/L (ref 98–111)
Creatinine: 1.41 mg/dL — ABNORMAL HIGH (ref 0.61–1.24)
GFR, Est AFR Am: >60 mL/min (ref >60)
GFR, Estimated: 60 mL/min — ABNORMAL LOW (ref >60)
Glucose, Bld: 125 mg/dL — ABNORMAL HIGH (ref 70–99)
Potassium: 3.7 mmol/L (ref 3.5–5.1)
Sodium: 141 mmol/L (ref 135–145)
Total Bilirubin: 0.7 mg/dL (ref 0.3–1.2)
Total Protein: 7.2 g/dL (ref 6.5–8.1)

## 2019-02-26 MED ORDER — ONDANSETRON HCL 8 MG PO TABS
ORAL_TABLET | ORAL | Status: AC
  Filled 2019-02-26: qty 1

## 2019-02-26 MED ORDER — DEXAMETHASONE 4 MG PO TABS
ORAL_TABLET | ORAL | Status: AC
  Filled 2019-02-26: qty 5

## 2019-02-26 MED ORDER — BORTEZOMIB CHEMO SQ INJECTION 3.5 MG (2.5MG/ML)
1.3000 mg/m2 | Freq: Once | INTRAMUSCULAR | Status: AC
  Administered 2019-02-26: 2.75 mg via SUBCUTANEOUS
  Filled 2019-02-26: qty 1.1

## 2019-02-26 MED ORDER — DEXAMETHASONE 4 MG PO TABS
40.0000 mg | ORAL_TABLET | Freq: Once | ORAL | Status: AC
  Administered 2019-02-26: 40 mg via ORAL

## 2019-02-26 MED ORDER — ONDANSETRON HCL 8 MG PO TABS
4.0000 mg | ORAL_TABLET | Freq: Once | ORAL | Status: AC
  Administered 2019-02-26: 4 mg via ORAL

## 2019-02-26 NOTE — Patient Instructions (Signed)
[facility] Discharge Instructions for Patients Receiving Chemotherapy  Today you received the following chemotherapy agents Velcade.  To help prevent nausea and vomiting after your treatment, we encourage you to take your nausea medication as directed.  If you develop nausea and vomiting that is not controlled by your nausea medication, call the clinic.   BELOW ARE SYMPTOMS THAT SHOULD BE REPORTED IMMEDIATELY:  *FEVER GREATER THAN 100.5 F  *CHILLS WITH OR WITHOUT FEVER  NAUSEA AND VOMITING THAT IS NOT CONTROLLED WITH YOUR NAUSEA MEDICATION  *UNUSUAL SHORTNESS OF BREATH  *UNUSUAL BRUISING OR BLEEDING  TENDERNESS IN MOUTH AND THROAT WITH OR WITHOUT PRESENCE OF ULCERS  *URINARY PROBLEMS  *BOWEL PROBLEMS  UNUSUAL RASH Items with * indicate a potential emergency and should be followed up as soon as possible.  Feel free to call the clinic should you have any questions or concerns. The clinic phone number is [phone].  Please show the CHEMO ALERT CARD at check-in to the Emergency Department and triage nurse.   

## 2019-02-27 LAB — PROTEIN ELECTROPHORESIS, SERUM
A/G Ratio: 1.6 (ref 0.7–1.7)
Albumin ELP: 4.1 g/dL (ref 2.9–4.4)
Alpha-1-Globulin: 0.2 g/dL (ref 0.0–0.4)
Alpha-2-Globulin: 0.9 g/dL (ref 0.4–1.0)
Beta Globulin: 1.1 g/dL (ref 0.7–1.3)
Gamma Globulin: 0.4 g/dL (ref 0.4–1.8)
Globulin, Total: 2.6 g/dL (ref 2.2–3.9)
M-Spike, %: 0.4 g/dL — ABNORMAL HIGH
Total Protein ELP: 6.7 g/dL (ref 6.0–8.5)

## 2019-02-27 LAB — KAPPA/LAMBDA LIGHT CHAINS
Kappa free light chain: 1575.6 mg/L — ABNORMAL HIGH (ref 3.3–19.4)
Kappa, lambda light chain ratio: 199.44 — ABNORMAL HIGH (ref 0.26–1.65)
Lambda free light chains: 7.9 mg/L (ref 5.7–26.3)

## 2019-02-27 LAB — CYTOLOGY - NON PAP

## 2019-03-05 ENCOUNTER — Telehealth: Payer: Self-pay

## 2019-03-05 NOTE — Telephone Encounter (Signed)
Return TC to patient in regard to his question for [name] NP about his medication Revlimid . Question: Patient called and wants to know about this Revlimid medication. He stated that this is his week off however he did not recieve med when it was supposed to start he got it the day after. So he has one more pill left and wants to know if he is suppose to take it tonight or wait?   Lattie Haw asked me verify start date of Revlimid with him. Our note on 12/1 indicates a start date of 11/24 for 21 days. Patient confirmed that his start date was 11/24. I let him know per [name] NP that since this is accurate today would be day 21 and 7 day break begins tomorrow. Patient verbalized understanding. No further problems or concerns at this time.

## 2019-03-06 ENCOUNTER — Other Ambulatory Visit: Payer: Self-pay | Admitting: Oncology

## 2019-03-06 DIAGNOSIS — C9000 Multiple myeloma not having achieved remission: Secondary | ICD-10-CM

## 2019-03-07 ENCOUNTER — Other Ambulatory Visit: Payer: Self-pay | Admitting: Oncology

## 2019-03-07 DIAGNOSIS — C9000 Multiple myeloma not having achieved remission: Secondary | ICD-10-CM

## 2019-03-11 ENCOUNTER — Other Ambulatory Visit: Payer: Self-pay | Admitting: Oncology

## 2019-03-12 ENCOUNTER — Inpatient Hospital Stay: Payer: BC Managed Care – PPO

## 2019-03-12 ENCOUNTER — Ambulatory Visit (HOSPITAL_COMMUNITY)
Admission: RE | Admit: 2019-03-12 | Discharge: 2019-03-12 | Disposition: A | Discharge status: Home / Self Care | Payer: BC Managed Care – PPO | Source: Ambulatory Visit | Attending: Nurse Practitioner | Admitting: Nurse Practitioner

## 2019-03-12 ENCOUNTER — Inpatient Hospital Stay (HOSPITAL_BASED_OUTPATIENT_CLINIC_OR_DEPARTMENT_OTHER): Payer: BC Managed Care – PPO | Admitting: Nurse Practitioner

## 2019-03-12 ENCOUNTER — Other Ambulatory Visit: Payer: Self-pay

## 2019-03-12 ENCOUNTER — Encounter: Payer: Self-pay | Admitting: Nurse Practitioner

## 2019-03-12 VITALS — BP 149/95 | HR 90 | Temp 99.1°F | Ht 73.0 in | Wt 228.7 lb

## 2019-03-12 DIAGNOSIS — C9000 Multiple myeloma not having achieved remission: Secondary | ICD-10-CM | POA: Insufficient documentation

## 2019-03-12 LAB — CBC WITH DIFFERENTIAL (CANCER CENTER ONLY)
Abs Immature Granulocytes: 0.03 10*3/uL (ref 0.00–0.07)
Basophils Absolute: 0.1 10*3/uL (ref 0.0–0.1)
Basophils Relative: 1 %
Eosinophils Absolute: 0.1 10*3/uL (ref 0.0–0.5)
Eosinophils Relative: 1 %
HCT: 31.8 % — ABNORMAL LOW (ref 39.0–52.0)
Hemoglobin: 10.6 g/dL — ABNORMAL LOW (ref 13.0–17.0)
Immature Granulocytes: 0 %
Lymphocytes Relative: 26 %
Lymphs Abs: 1.9 10*3/uL (ref 0.7–4.0)
MCH: 29.4 pg (ref 26.0–34.0)
MCHC: 33.3 g/dL (ref 30.0–36.0)
MCV: 88.3 fL (ref 80.0–100.0)
Monocytes Absolute: 0.8 10*3/uL (ref 0.1–1.0)
Monocytes Relative: 12 %
Neutro Abs: 4.2 10*3/uL (ref 1.7–7.7)
Neutrophils Relative %: 60 %
Platelet Count: 370 10*3/uL (ref 150–400)
RBC: 3.6 MIL/uL — ABNORMAL LOW (ref 4.22–5.81)
RDW: 14.1 % (ref 11.5–15.5)
WBC Count: 7.1 10*3/uL (ref 4.0–10.5)
nRBC: 0 % (ref 0.0–0.2)

## 2019-03-12 LAB — CMP (CANCER CENTER ONLY)
ALT: 27 U/L (ref 0–44)
AST: 16 U/L (ref 15–41)
Albumin: 4 g/dL (ref 3.5–5.0)
Alkaline Phosphatase: 83 U/L (ref 38–126)
Anion gap: 12 (ref 5–15)
BUN: 8 mg/dL (ref 6–20)
CO2: 26 mmol/L (ref 22–32)
Calcium: 9.4 mg/dL (ref 8.9–10.3)
Chloride: 106 mmol/L (ref 98–111)
Creatinine: 1.48 mg/dL — ABNORMAL HIGH (ref 0.61–1.24)
GFR, Est AFR Am: >60 mL/min (ref >60)
GFR, Estimated: 56 mL/min — ABNORMAL LOW (ref >60)
Glucose, Bld: 96 mg/dL (ref 70–99)
Potassium: 3.6 mmol/L (ref 3.5–5.1)
Sodium: 144 mmol/L (ref 135–145)
Total Bilirubin: 0.4 mg/dL (ref 0.3–1.2)
Total Protein: 7.2 g/dL (ref 6.5–8.1)

## 2019-03-12 MED ORDER — AMLODIPINE BESYLATE 5 MG PO TABS: 5.0000 mg | ORAL_TABLET | Freq: Every day | ORAL | 1 refills | Status: DC

## 2019-03-12 MED ORDER — APIXABAN 5 MG PO TABS: 5.0000 mg | ORAL_TABLET | Freq: Two times a day (BID) | ORAL | 2 refills | Status: DC

## 2019-03-12 MED ORDER — ONDANSETRON HCL 8 MG PO TABS
ORAL_TABLET | ORAL | Status: AC
  Filled 2019-03-12: qty 1

## 2019-03-12 MED ORDER — FLUCONAZOLE 100 MG PO TABS: 100.0000 mg | ORAL_TABLET | Freq: Every day | ORAL | 0 refills | Status: DC

## 2019-03-12 MED ORDER — BORTEZOMIB CHEMO SQ INJECTION 3.5 MG (2.5MG/ML)
1.3000 mg/m2 | Freq: Once | INTRAMUSCULAR | Status: AC
  Administered 2019-03-12: 2.75 mg via SUBCUTANEOUS
  Filled 2019-03-12: qty 1.1

## 2019-03-12 MED ORDER — DEXAMETHASONE 4 MG PO TABS
ORAL_TABLET | ORAL | Status: AC
  Filled 2019-03-12: qty 10

## 2019-03-12 MED ORDER — ONDANSETRON HCL 8 MG PO TABS
4.0000 mg | ORAL_TABLET | Freq: Once | ORAL | Status: AC
  Administered 2019-03-12: 4 mg via ORAL

## 2019-03-12 MED ORDER — DEXAMETHASONE 4 MG PO TABS
40.0000 mg | ORAL_TABLET | Freq: Once | ORAL | Status: AC
  Administered 2019-03-12: 40 mg via ORAL

## 2019-03-12 NOTE — Patient Instructions (Signed)
[facility] Discharge Instructions for Patients Receiving Chemotherapy  Today you received the following chemotherapy agents Velcade.  To help prevent nausea and vomiting after your treatment, we encourage you to take your nausea medication as directed.  If you develop nausea and vomiting that is not controlled by your nausea medication, call the clinic.   BELOW ARE SYMPTOMS THAT SHOULD BE REPORTED IMMEDIATELY:  *FEVER GREATER THAN 100.5 F  *CHILLS WITH OR WITHOUT FEVER  NAUSEA AND VOMITING THAT IS NOT CONTROLLED WITH YOUR NAUSEA MEDICATION  *UNUSUAL SHORTNESS OF BREATH  *UNUSUAL BRUISING OR BLEEDING  TENDERNESS IN MOUTH AND THROAT WITH OR WITHOUT PRESENCE OF ULCERS  *URINARY PROBLEMS  *BOWEL PROBLEMS  UNUSUAL RASH Items with * indicate a potential emergency and should be followed up as soon as possible.  Feel free to call the clinic should you have any questions or concerns. The clinic phone number is [phone].  Please show the CHEMO ALERT CARD at check-in to the Emergency Department and triage nurse.   

## 2019-03-12 NOTE — Progress Notes (Signed)
Left lower extremity venous duplex has been completed. Preliminary results can be found in CV Proc through chart review.  Results were given to [name] NP.  03/12/19 3:57 PM Carlos Levering RVT
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
## 2019-03-13 ENCOUNTER — Telehealth: Payer: Self-pay | Admitting: *Deleted

## 2019-03-13 ENCOUNTER — Telehealth: Payer: Self-pay

## 2019-03-13 ENCOUNTER — Other Ambulatory Visit: Payer: Self-pay | Admitting: Nurse Practitioner

## 2019-03-13 DIAGNOSIS — C9000 Multiple myeloma not having achieved remission: Secondary | ICD-10-CM

## 2019-03-13 MED ORDER — RIVAROXABAN 20 MG PO TABS: 20.0000 mg | ORAL_TABLET | Freq: Every day | ORAL | 4 refills | Status: DC

## 2019-03-13 NOTE — Telephone Encounter (Signed)
Case ID: [id number].  Eliquis authorized 02/11/2019 through 05/12/2019.  Also obtained Drug discount card printed for patient through [employer].

## 2019-03-13 NOTE — Telephone Encounter (Signed)
TC to pt to let him know that his prescription for Eliquis is available at [facility] and there in no charge now with his insurance. Patient verbalized understanding. Patient also stated that he needs to reschedule his lab and flush appointment on 03/19/19 due to having another appointment on that day. I sent over schedule message to get his appointments rescheduled. No further problems or concerns at this time.

## 2019-03-13 NOTE — Telephone Encounter (Signed)
Received fax from [facility] that Eliquis requires PA from his insurance carrier. List received noted PA not required for Xarelto. Xarelto was sent and per pharmacy, this still needs a PA. Per [name], NP--proceed w/PA process for the first choice Eliquis 5mg  bid. Forwarded this to [place], Therapist, sports to process.

## 2019-03-15 ENCOUNTER — Other Ambulatory Visit: Payer: Self-pay | Admitting: *Deleted

## 2019-03-15 ENCOUNTER — Telehealth: Payer: Self-pay | Admitting: Nurse Practitioner

## 2019-03-15 DIAGNOSIS — C9000 Multiple myeloma not having achieved remission: Secondary | ICD-10-CM

## 2019-03-15 NOTE — Telephone Encounter (Signed)
R/s appt per 12/22 sch message. Unable to reach pt . Left message with new appt date for 12/29

## 2019-03-18 ENCOUNTER — Other Ambulatory Visit: Payer: Self-pay | Admitting: Oncology

## 2019-03-19 ENCOUNTER — Inpatient Hospital Stay: Payer: BC Managed Care – PPO

## 2019-03-20 ENCOUNTER — Inpatient Hospital Stay: Payer: BC Managed Care – PPO

## 2019-03-20 ENCOUNTER — Other Ambulatory Visit: Payer: Self-pay

## 2019-03-20 ENCOUNTER — Other Ambulatory Visit: Payer: Self-pay | Admitting: Nurse Practitioner

## 2019-03-20 VITALS — BP 136/96 | HR 83 | Temp 98.3°F | Resp 17 | Ht 73.0 in | Wt 223.5 lb

## 2019-03-20 DIAGNOSIS — C9000 Multiple myeloma not having achieved remission: Secondary | ICD-10-CM

## 2019-03-20 LAB — CBC WITH DIFFERENTIAL (CANCER CENTER ONLY)
Abs Immature Granulocytes: 0.07 10*3/uL (ref 0.00–0.07)
Basophils Absolute: 0.1 10*3/uL (ref 0.0–0.1)
Basophils Relative: 2 %
Eosinophils Absolute: 0.3 10*3/uL (ref 0.0–0.5)
Eosinophils Relative: 5 %
HCT: 35.6 % — ABNORMAL LOW (ref 39.0–52.0)
Hemoglobin: 12 g/dL — ABNORMAL LOW (ref 13.0–17.0)
Immature Granulocytes: 2 %
Lymphocytes Relative: 30 %
Lymphs Abs: 1.4 10*3/uL (ref 0.7–4.0)
MCH: 29 pg (ref 26.0–34.0)
MCHC: 33.7 g/dL (ref 30.0–36.0)
MCV: 86 fL (ref 80.0–100.0)
Monocytes Absolute: 0.4 10*3/uL (ref 0.1–1.0)
Monocytes Relative: 9 %
Neutro Abs: 2.6 10*3/uL (ref 1.7–7.7)
Neutrophils Relative %: 52 %
Platelet Count: 360 10*3/uL (ref 150–400)
RBC: 4.14 MIL/uL — ABNORMAL LOW (ref 4.22–5.81)
RDW: 14 % (ref 11.5–15.5)
WBC Count: 4.8 10*3/uL (ref 4.0–10.5)
nRBC: 0 % (ref 0.0–0.2)

## 2019-03-20 LAB — CMP (CANCER CENTER ONLY)
ALT: 26 U/L (ref 0–44)
AST: 22 U/L (ref 15–41)
Albumin: 4.5 g/dL (ref 3.5–5.0)
Alkaline Phosphatase: 78 U/L (ref 38–126)
Anion gap: 10 (ref 5–15)
BUN: 14 mg/dL (ref 6–20)
CO2: 26 mmol/L (ref 22–32)
Calcium: 9.4 mg/dL (ref 8.9–10.3)
Chloride: 106 mmol/L (ref 98–111)
Creatinine: 1.27 mg/dL — ABNORMAL HIGH (ref 0.61–1.24)
GFR, Est AFR Am: >60 mL/min (ref >60)
GFR, Estimated: >60 mL/min (ref >60)
Glucose, Bld: 112 mg/dL — ABNORMAL HIGH (ref 70–99)
Potassium: 3.5 mmol/L (ref 3.5–5.1)
Sodium: 142 mmol/L (ref 135–145)
Total Bilirubin: 0.8 mg/dL (ref 0.3–1.2)
Total Protein: 7.4 g/dL (ref 6.5–8.1)

## 2019-03-20 MED ORDER — BORTEZOMIB CHEMO SQ INJECTION 3.5 MG (2.5MG/ML)
1.3000 mg/m2 | Freq: Once | INTRAMUSCULAR | Status: AC
  Administered 2019-03-20: 2.75 mg via SUBCUTANEOUS
  Filled 2019-03-20: qty 1.1

## 2019-03-20 MED ORDER — DEXAMETHASONE 4 MG PO TABS
ORAL_TABLET | ORAL | Status: AC
  Filled 2019-03-20: qty 10

## 2019-03-20 MED ORDER — ONDANSETRON HCL 8 MG PO TABS
4.0000 mg | ORAL_TABLET | Freq: Once | ORAL | Status: AC
  Administered 2019-03-20: 4 mg via ORAL

## 2019-03-20 MED ORDER — DEXAMETHASONE 4 MG PO TABS
40.0000 mg | ORAL_TABLET | Freq: Once | ORAL | Status: AC
  Administered 2019-03-20: 40 mg via ORAL

## 2019-03-20 MED ORDER — CLOTRIMAZOLE 10 MG MT TROC: 10.0000 mg | Freq: Three times a day (TID) | OROMUCOSAL | 0 refills | Status: AC

## 2019-03-20 MED ORDER — ONDANSETRON HCL 8 MG PO TABS
ORAL_TABLET | ORAL | Status: AC
  Filled 2019-03-20: qty 1

## 2019-03-20 NOTE — Progress Notes (Signed)
Per Dr. Benay Spice: OK to proceed with D1C6 of Bortezomib today without waiting for CMP to result

## 2019-03-20 NOTE — Patient Instructions (Signed)
[facility] Discharge Instructions for Patients Receiving Chemotherapy  Today you received the following chemotherapy agents: Bortezomib (Velcade)  To help prevent nausea and vomiting after your treatment, we encourage you to take your nausea medication as directed.   If you develop nausea and vomiting that is not controlled by your nausea medication, call the clinic.   BELOW ARE SYMPTOMS THAT SHOULD BE REPORTED IMMEDIATELY:  *FEVER GREATER THAN 100.5 F  *CHILLS WITH OR WITHOUT FEVER  NAUSEA AND VOMITING THAT IS NOT CONTROLLED WITH YOUR NAUSEA MEDICATION  *UNUSUAL SHORTNESS OF BREATH  *UNUSUAL BRUISING OR BLEEDING  TENDERNESS IN MOUTH AND THROAT WITH OR WITHOUT PRESENCE OF ULCERS  *URINARY PROBLEMS  *BOWEL PROBLEMS  UNUSUAL RASH Items with * indicate a potential emergency and should be followed up as soon as possible.  Feel free to call the clinic should you have any questions or concerns. The clinic phone number is [phone].  Please show the CHEMO ALERT CARD at check-in to the Emergency Department and triage nurse.  Coronavirus (COVID-19) Are you at risk?  Are you at risk for the Coronavirus (COVID-19)?  To be considered HIGH RISK for Coronavirus (COVID-19), you have to meet the following criteria:  . Traveled to [place], [place], [place], [place] or [place]; or in the [place] to [place], [place], [place], or [place]; and have fever, cough, and shortness of breath within the last 2 weeks of travel OR . Been in close contact with a person diagnosed with COVID-19 within the last 2 weeks and have fever, cough, and shortness of breath . IF YOU DO NOT MEET THESE CRITERIA, YOU ARE CONSIDERED LOW RISK FOR COVID-19.  What to do if you are HIGH RISK for COVID-19?  . If you are having a medical emergency, call 911. . Seek medical care right away. Before you go to a doctor's office, urgent care or emergency department, call ahead and tell them  about your recent travel, contact with someone diagnosed with COVID-19, and your symptoms. You should receive instructions from your physician's office regarding next steps of care.  . When you arrive at healthcare provider, tell the healthcare staff immediately you have returned from visiting [place], [place], [place], [place] or [place]; or traveled in the [place] to [place], [place], [place], or [place]; in the last two weeks or you have been in close contact with a person diagnosed with COVID-19 in the last 2 weeks.   . Tell the health care staff about your symptoms: fever, cough and shortness of breath. . After you have been seen by a medical provider, you will be either: o Tested for (COVID-19) and discharged home on quarantine except to seek medical care if symptoms worsen, and asked to  - Stay home and avoid contact with others until you get your results (4-5 days)  - Avoid travel on public transportation if possible (such as bus, train, or airplane) or o Sent to the Emergency Department by EMS for evaluation, COVID-19 testing, and possible admission depending on your condition and test results.  What to do if you are LOW RISK for COVID-19?  Reduce your risk of any infection by using the same precautions used for avoiding the common cold or flu:  . Wash your hands often with soap and warm water for at least 20 seconds.  If soap and water are not readily available, use an alcohol-based hand sanitizer with at least 60% alcohol.  . If coughing or   sneezing, cover your mouth and nose by coughing or sneezing into the elbow areas of your shirt or coat, into a tissue or into your sleeve (not your hands). . Avoid shaking hands with others and consider head nods or verbal greetings only. . Avoid touching your eyes, nose, or mouth with unwashed hands.  . Avoid close contact with people who are sick. . Avoid places or events with large numbers of people in one location, like concerts or  sporting events. . Carefully consider travel plans you have or are making. . If you are planning any travel outside or inside the [place], visit the CDC's Travelers' Health webpage for the latest health notices. . If you have some symptoms but not all symptoms, continue to monitor at home and seek medical attention if your symptoms worsen. . If you are having a medical emergency, call 911.   ADDITIONAL HEALTHCARE OPTIONS FOR PATIENTS  [facility] Tele[facility] / e-Visit: [email or web address]         [facility]: [phone]  [facility]: [phone]                   [facility]: [phone]   

## 2019-03-22 ENCOUNTER — Other Ambulatory Visit: Payer: Self-pay | Admitting: *Deleted

## 2019-03-22 DIAGNOSIS — C9000 Multiple myeloma not having achieved remission: Secondary | ICD-10-CM

## 2019-03-24 ENCOUNTER — Other Ambulatory Visit: Payer: Self-pay | Admitting: Oncology

## 2019-03-26 ENCOUNTER — Inpatient Hospital Stay: Payer: BC Managed Care – PPO | Attending: Oncology

## 2019-03-26 ENCOUNTER — Other Ambulatory Visit: Payer: Self-pay

## 2019-03-26 ENCOUNTER — Inpatient Hospital Stay: Payer: BC Managed Care – PPO

## 2019-03-26 ENCOUNTER — Encounter: Payer: Self-pay | Admitting: Nurse Practitioner

## 2019-03-26 ENCOUNTER — Inpatient Hospital Stay (HOSPITAL_BASED_OUTPATIENT_CLINIC_OR_DEPARTMENT_OTHER): Payer: BC Managed Care – PPO | Admitting: Nurse Practitioner

## 2019-03-26 ENCOUNTER — Other Ambulatory Visit: Payer: BC Managed Care – PPO

## 2019-03-26 VITALS — BP 141/92 | HR 81 | Temp 98.9°F | Resp 17 | Ht 73.0 in | Wt 229.0 lb

## 2019-03-26 DIAGNOSIS — I10 Essential (primary) hypertension: Secondary | ICD-10-CM | POA: Insufficient documentation

## 2019-03-26 DIAGNOSIS — C9000 Multiple myeloma not having achieved remission: Secondary | ICD-10-CM | POA: Insufficient documentation

## 2019-03-26 DIAGNOSIS — Z5112 Encounter for antineoplastic immunotherapy: Secondary | ICD-10-CM | POA: Insufficient documentation

## 2019-03-26 DIAGNOSIS — Z7901 Long term (current) use of anticoagulants: Secondary | ICD-10-CM | POA: Diagnosis not present

## 2019-03-26 DIAGNOSIS — Z86718 Personal history of other venous thrombosis and embolism: Secondary | ICD-10-CM | POA: Insufficient documentation

## 2019-03-26 LAB — CBC WITH DIFFERENTIAL (CANCER CENTER ONLY)
Abs Immature Granulocytes: 0.12 10*3/uL — ABNORMAL HIGH (ref 0.00–0.07)
Basophils Absolute: 0.1 10*3/uL (ref 0.0–0.1)
Basophils Relative: 1 %
Eosinophils Absolute: 0.3 10*3/uL (ref 0.0–0.5)
Eosinophils Relative: 4 %
HCT: 35.3 % — ABNORMAL LOW (ref 39.0–52.0)
Hemoglobin: 12.1 g/dL — ABNORMAL LOW (ref 13.0–17.0)
Immature Granulocytes: 2 %
Lymphocytes Relative: 24 %
Lymphs Abs: 1.5 10*3/uL (ref 0.7–4.0)
MCH: 29.2 pg (ref 26.0–34.0)
MCHC: 34.3 g/dL (ref 30.0–36.0)
MCV: 85.1 fL (ref 80.0–100.0)
Monocytes Absolute: 0.7 10*3/uL (ref 0.1–1.0)
Monocytes Relative: 11 %
Neutro Abs: 3.9 10*3/uL (ref 1.7–7.7)
Neutrophils Relative %: 58 %
Platelet Count: 323 10*3/uL (ref 150–400)
RBC: 4.15 MIL/uL — ABNORMAL LOW (ref 4.22–5.81)
RDW: 13.8 % (ref 11.5–15.5)
WBC Count: 6.5 10*3/uL (ref 4.0–10.5)
nRBC: 0 % (ref 0.0–0.2)

## 2019-03-26 LAB — CMP (CANCER CENTER ONLY)
ALT: 29 U/L (ref 0–44)
AST: 19 U/L (ref 15–41)
Albumin: 4.4 g/dL (ref 3.5–5.0)
Alkaline Phosphatase: 76 U/L (ref 38–126)
Anion gap: 10 (ref 5–15)
BUN: 12 mg/dL (ref 6–20)
CO2: 25 mmol/L (ref 22–32)
Calcium: 9.4 mg/dL (ref 8.9–10.3)
Chloride: 105 mmol/L (ref 98–111)
Creatinine: 1.32 mg/dL — ABNORMAL HIGH (ref 0.61–1.24)
GFR, Est AFR Am: >60 mL/min (ref >60)
GFR, Estimated: >60 mL/min (ref >60)
Glucose, Bld: 104 mg/dL — ABNORMAL HIGH (ref 70–99)
Potassium: 3.3 mmol/L — ABNORMAL LOW (ref 3.5–5.1)
Sodium: 140 mmol/L (ref 135–145)
Total Bilirubin: 0.3 mg/dL (ref 0.3–1.2)
Total Protein: 7 g/dL (ref 6.5–8.1)

## 2019-03-26 MED ORDER — DEXAMETHASONE 4 MG PO TABS
ORAL_TABLET | ORAL | Status: AC
  Filled 2019-03-26: qty 10

## 2019-03-26 MED ORDER — DEXAMETHASONE 4 MG PO TABS
40.0000 mg | ORAL_TABLET | Freq: Once | ORAL | Status: AC
  Administered 2019-03-26: 16:00:00 40 mg via ORAL

## 2019-03-26 MED ORDER — ONDANSETRON HCL 8 MG PO TABS
ORAL_TABLET | ORAL | Status: AC
  Filled 2019-03-26: qty 1

## 2019-03-26 MED ORDER — BORTEZOMIB CHEMO SQ INJECTION 3.5 MG (2.5MG/ML)
1.3000 mg/m2 | Freq: Once | INTRAMUSCULAR | Status: AC
  Administered 2019-03-26: 2.75 mg via SUBCUTANEOUS
  Filled 2019-03-26: qty 1.1

## 2019-03-26 MED ORDER — ONDANSETRON HCL 8 MG PO TABS
4.0000 mg | ORAL_TABLET | Freq: Once | ORAL | Status: AC
  Administered 2019-03-26: 16:00:00 4 mg via ORAL

## 2019-03-26 NOTE — Patient Instructions (Signed)
[facility] Discharge Instructions for Patients Receiving Chemotherapy  Today you received the following chemotherapy agents Velcade.  To help prevent nausea and vomiting after your treatment, we encourage you to take your nausea medication as directed.  If you develop nausea and vomiting that is not controlled by your nausea medication, call the clinic.   BELOW ARE SYMPTOMS THAT SHOULD BE REPORTED IMMEDIATELY:  *FEVER GREATER THAN 100.5 F  *CHILLS WITH OR WITHOUT FEVER  NAUSEA AND VOMITING THAT IS NOT CONTROLLED WITH YOUR NAUSEA MEDICATION  *UNUSUAL SHORTNESS OF BREATH  *UNUSUAL BRUISING OR BLEEDING  TENDERNESS IN MOUTH AND THROAT WITH OR WITHOUT PRESENCE OF ULCERS  *URINARY PROBLEMS  *BOWEL PROBLEMS  UNUSUAL RASH Items with * indicate a potential emergency and should be followed up as soon as possible.  Feel free to call the clinic should you have any questions or concerns. The clinic phone number is [phone].  Please show the CHEMO ALERT CARD at check-in to the Emergency Department and triage nurse.   
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
## 2019-03-27 ENCOUNTER — Telehealth: Payer: Self-pay | Admitting: Oncology

## 2019-03-27 ENCOUNTER — Other Ambulatory Visit: Payer: Self-pay | Admitting: *Deleted

## 2019-03-27 DIAGNOSIS — C9000 Multiple myeloma not having achieved remission: Secondary | ICD-10-CM

## 2019-03-27 MED ORDER — LENALIDOMIDE 15 MG PO CAPS: ORAL_CAPSULE | ORAL | 0 refills | Status: DC

## 2019-03-27 NOTE — Telephone Encounter (Signed)
Scheduled per los. Called and spoke with patient. Confirmed appts   1/4 los requested GBS on 1/18 and 2/1 however patient can only come after 2pm so patient was put on LT schedule

## 2019-03-30 ENCOUNTER — Other Ambulatory Visit: Payer: Self-pay | Admitting: Nurse Practitioner

## 2019-03-30 ENCOUNTER — Telehealth: Payer: Self-pay | Admitting: *Deleted

## 2019-03-30 DIAGNOSIS — C9000 Multiple myeloma not having achieved remission: Secondary | ICD-10-CM

## 2019-03-30 MED ORDER — POTASSIUM CHLORIDE CRYS ER 20 MEQ PO TBCR: 20.0000 meq | EXTENDED_RELEASE_TABLET | Freq: Every day | ORAL | 1 refills | Status: DC

## 2019-03-30 NOTE — Telephone Encounter (Signed)
Per [name], NP, called to notify pt that his potassium was mildly decreased on recent labs. Prescription sent for K-Dur 20 milliequivalent's daily. Pt verbalized understanding

## 2019-03-30 NOTE — Telephone Encounter (Signed)
-----   Message from Owens Shark, NP sent at 03/30/2019 10:41 AM EST ----- Please let him know potassium was mildly decreased on recent labs.  I am sending a prescription to his pharmacy for K-Dur 20 milliequivalents daily.

## 2019-04-02 ENCOUNTER — Other Ambulatory Visit: Payer: Self-pay | Admitting: Oncology

## 2019-04-02 DIAGNOSIS — C9000 Multiple myeloma not having achieved remission: Secondary | ICD-10-CM

## 2019-04-03 ENCOUNTER — Other Ambulatory Visit: Payer: Self-pay | Admitting: Oncology

## 2019-04-03 DIAGNOSIS — C9000 Multiple myeloma not having achieved remission: Secondary | ICD-10-CM

## 2019-04-06 ENCOUNTER — Telehealth: Payer: Self-pay | Admitting: *Deleted

## 2019-04-06 NOTE — Telephone Encounter (Signed)
Call from [facility]: His BMBX has 15% plasma cells. Dr. Norma Fredrickson recommends 2 more cycles of Revlimid/Velcade and Dexamethasone. MD notified.

## 2019-04-08 ENCOUNTER — Other Ambulatory Visit: Payer: Self-pay | Admitting: Oncology

## 2019-04-09 ENCOUNTER — Inpatient Hospital Stay: Payer: BC Managed Care – PPO | Admitting: Nurse Practitioner

## 2019-04-09 ENCOUNTER — Telehealth: Payer: Self-pay

## 2019-04-09 ENCOUNTER — Inpatient Hospital Stay: Payer: BC Managed Care – PPO

## 2019-04-09 NOTE — Telephone Encounter (Signed)
TC to Pt Per Lattie Haw to ask Pt why he cancelled appointment for today. Pt stated that he had an insurance issue which he took care of and is now waiting for the pharmacy to call him about his Revlimid. Pt informed to give [place] a call when he receives his Revlimid. Pt verbalized understanding. No further problems or concerns noted

## 2019-04-10 ENCOUNTER — Telehealth: Payer: Self-pay | Admitting: *Deleted

## 2019-04-10 DIAGNOSIS — C9000 Multiple myeloma not having achieved remission: Secondary | ICD-10-CM

## 2019-04-10 MED ORDER — LENALIDOMIDE 15 MG PO CAPS: ORAL_CAPSULE | ORAL | 0 refills | Status: DC

## 2019-04-10 NOTE — Telephone Encounter (Signed)
Received fax from [facility] requesting patient demographic information (already sent once) and MD information. This was provided as requested and faxed to [phone]

## 2019-04-10 NOTE — Telephone Encounter (Signed)
Patient called concerned that he has not received his Revlimid yet. Feels pharmacy is "playing with my life". Requests return call. Received fax today from [facility] that the Revlimid must now be filled at [facility]. Called pharmacy [phone] and representative confirmed this was the [facility] location. Once received they will do benefit investigation and reach out to office if PA is required. E-scribed script and also faxed facesheet, script, and notice from [facility] regarding need to transfer to [facility]. @ 1530 Confirmed w/pharmacy that script has been received. They need insurance information on patient to determine coverage and if PA is required. Called patient and gave him the direct # [phone] for pharmacy that was given to this RN by Caryl Pina, with [facility]. He will call now to provide insurance information.

## 2019-04-11 ENCOUNTER — Telehealth: Payer: Self-pay | Admitting: *Deleted

## 2019-04-11 NOTE — Telephone Encounter (Signed)
Confirmed w/pharmacy that they have script and demographic and insurance information. Needs PA and they will then be able to send med to patient. Call [phone] to complete PA. Forwarded this to [place], Therapist, sports to complete.

## 2019-04-11 NOTE — Telephone Encounter (Signed)
12 month prior authorization obtained.  PA#: [id number].

## 2019-04-12 ENCOUNTER — Telehealth: Payer: Self-pay | Admitting: *Deleted

## 2019-04-12 NOTE — Telephone Encounter (Addendum)
Reports he should have delivery of his lenolidomide tomorrow or Saturday. Copay was $6000, but pharmacy was able to enroll him in assistance from drug company, so he pays $25. Asking when he is to have next treatment and start medication. On schedule for 04/16/19--will speak w/MD about keeping this or reschedule. Per Dr. Benay Spice: Let's keep everything to start on 04/16/19. Patient agrees.

## 2019-04-15 ENCOUNTER — Other Ambulatory Visit: Payer: Self-pay | Admitting: Oncology

## 2019-04-16 ENCOUNTER — Inpatient Hospital Stay: Payer: BC Managed Care – PPO

## 2019-04-16 ENCOUNTER — Other Ambulatory Visit: Payer: Self-pay

## 2019-04-16 ENCOUNTER — Other Ambulatory Visit: Payer: Self-pay | Admitting: Oncology

## 2019-04-16 VITALS — BP 136/94 | HR 81 | Temp 98.3°F | Resp 18 | Ht 73.0 in | Wt 240.8 lb

## 2019-04-16 DIAGNOSIS — C9000 Multiple myeloma not having achieved remission: Secondary | ICD-10-CM

## 2019-04-16 LAB — CBC WITH DIFFERENTIAL (CANCER CENTER ONLY)
Abs Immature Granulocytes: 0.01 10*3/uL (ref 0.00–0.07)
Basophils Absolute: 0.2 10*3/uL — ABNORMAL HIGH (ref 0.0–0.1)
Basophils Relative: 4 %
Eosinophils Absolute: 0.1 10*3/uL (ref 0.0–0.5)
Eosinophils Relative: 2 %
HCT: 36 % — ABNORMAL LOW (ref 39.0–52.0)
Hemoglobin: 12.2 g/dL — ABNORMAL LOW (ref 13.0–17.0)
Immature Granulocytes: 0 %
Lymphocytes Relative: 42 %
Lymphs Abs: 1.8 10*3/uL (ref 0.7–4.0)
MCH: 28.6 pg (ref 26.0–34.0)
MCHC: 33.9 g/dL (ref 30.0–36.0)
MCV: 84.5 fL (ref 80.0–100.0)
Monocytes Absolute: 0.8 10*3/uL (ref 0.1–1.0)
Monocytes Relative: 18 %
Neutro Abs: 1.5 10*3/uL — ABNORMAL LOW (ref 1.7–7.7)
Neutrophils Relative %: 34 %
Platelet Count: 328 10*3/uL (ref 150–400)
RBC: 4.26 MIL/uL (ref 4.22–5.81)
RDW: 13.2 % (ref 11.5–15.5)
WBC Count: 4.4 10*3/uL (ref 4.0–10.5)
nRBC: 0 % (ref 0.0–0.2)

## 2019-04-16 LAB — CMP (CANCER CENTER ONLY)
ALT: 17 U/L (ref 0–44)
AST: 14 U/L — ABNORMAL LOW (ref 15–41)
Albumin: 4 g/dL (ref 3.5–5.0)
Alkaline Phosphatase: 116 U/L (ref 38–126)
Anion gap: 11 (ref 5–15)
BUN: 11 mg/dL (ref 6–20)
CO2: 22 mmol/L (ref 22–32)
Calcium: 9 mg/dL (ref 8.9–10.3)
Chloride: 109 mmol/L (ref 98–111)
Creatinine: 1.28 mg/dL — ABNORMAL HIGH (ref 0.61–1.24)
GFR, Est AFR Am: >60 mL/min (ref >60)
GFR, Estimated: >60 mL/min (ref >60)
Glucose, Bld: 96 mg/dL (ref 70–99)
Potassium: 3.8 mmol/L (ref 3.5–5.1)
Sodium: 142 mmol/L (ref 135–145)
Total Bilirubin: 0.3 mg/dL (ref 0.3–1.2)
Total Protein: 6.4 g/dL — ABNORMAL LOW (ref 6.5–8.1)

## 2019-04-16 MED ORDER — ONDANSETRON HCL 8 MG PO TABS
4.0000 mg | ORAL_TABLET | Freq: Once | ORAL | Status: AC
  Administered 2019-04-16: 16:00:00 4 mg via ORAL

## 2019-04-16 MED ORDER — DEXAMETHASONE 4 MG PO TABS
40.0000 mg | ORAL_TABLET | Freq: Once | ORAL | Status: AC
  Administered 2019-04-16: 40 mg via ORAL

## 2019-04-16 MED ORDER — ONDANSETRON HCL 8 MG PO TABS
ORAL_TABLET | ORAL | Status: AC
  Filled 2019-04-16: qty 1

## 2019-04-16 MED ORDER — DEXAMETHASONE 4 MG PO TABS
ORAL_TABLET | ORAL | Status: AC
  Filled 2019-04-16: qty 10

## 2019-04-16 MED ORDER — BORTEZOMIB CHEMO SQ INJECTION 3.5 MG (2.5MG/ML)
1.3000 mg/m2 | Freq: Once | INTRAMUSCULAR | Status: AC
  Administered 2019-04-16: 2.75 mg via SUBCUTANEOUS
  Filled 2019-04-16: qty 1.1

## 2019-04-16 NOTE — Progress Notes (Signed)
Spoke w/ Lattie Haw, keep dose of Velcade at 2.75 mg today in setting of weight gain. Please follow up dosing with next treatment with Dr. Benay Spice for doses going forward.   Demetrius Charity, PharmD, [facility] Pharmacist Pharmacy Phone: [phone] 04/16/2019

## 2019-04-16 NOTE — Patient Instructions (Signed)
[facility] Discharge Instructions for Patients Receiving Chemotherapy  Today you received the following chemotherapy agents Velcade.  To help prevent nausea and vomiting after your treatment, we encourage you to take your nausea medication as directed.  If you develop nausea and vomiting that is not controlled by your nausea medication, call the clinic.   BELOW ARE SYMPTOMS THAT SHOULD BE REPORTED IMMEDIATELY:  *FEVER GREATER THAN 100.5 F  *CHILLS WITH OR WITHOUT FEVER  NAUSEA AND VOMITING THAT IS NOT CONTROLLED WITH YOUR NAUSEA MEDICATION  *UNUSUAL SHORTNESS OF BREATH  *UNUSUAL BRUISING OR BLEEDING  TENDERNESS IN MOUTH AND THROAT WITH OR WITHOUT PRESENCE OF ULCERS  *URINARY PROBLEMS  *BOWEL PROBLEMS  UNUSUAL RASH Items with * indicate a potential emergency and should be followed up as soon as possible.  Feel free to call the clinic should you have any questions or concerns. The clinic phone number is [phone].  Please show the CHEMO ALERT CARD at check-in to the Emergency Department and triage nurse.   

## 2019-04-17 ENCOUNTER — Other Ambulatory Visit: Payer: Self-pay | Admitting: Nurse Practitioner

## 2019-04-17 DIAGNOSIS — C9000 Multiple myeloma not having achieved remission: Secondary | ICD-10-CM

## 2019-04-17 LAB — PROTEIN ELECTROPHORESIS, SERUM
A/G Ratio: 1.9 — ABNORMAL HIGH (ref 0.7–1.7)
Albumin ELP: 4 g/dL (ref 2.9–4.4)
Alpha-1-Globulin: 0.1 g/dL (ref 0.0–0.4)
Alpha-2-Globulin: 0.7 g/dL (ref 0.4–1.0)
Beta Globulin: 1 g/dL (ref 0.7–1.3)
Gamma Globulin: 0.3 g/dL — ABNORMAL LOW (ref 0.4–1.8)
Globulin, Total: 2.1 g/dL — ABNORMAL LOW (ref 2.2–3.9)
Total Protein ELP: 6.1 g/dL (ref 6.0–8.5)

## 2019-04-17 LAB — KAPPA/LAMBDA LIGHT CHAINS
Kappa free light chain: 786.6 mg/L — ABNORMAL HIGH (ref 3.3–19.4)
Kappa, lambda light chain ratio: 103.5 — ABNORMAL HIGH (ref 0.26–1.65)
Lambda free light chains: 7.6 mg/L (ref 5.7–26.3)

## 2019-04-18 ENCOUNTER — Encounter: Payer: Self-pay | Admitting: General Practice

## 2019-04-22 ENCOUNTER — Other Ambulatory Visit: Payer: Self-pay | Admitting: Oncology

## 2019-04-23 ENCOUNTER — Inpatient Hospital Stay: Payer: BC Managed Care – PPO

## 2019-04-23 ENCOUNTER — Other Ambulatory Visit: Payer: Self-pay

## 2019-04-23 ENCOUNTER — Inpatient Hospital Stay: Payer: BC Managed Care – PPO | Attending: Oncology | Admitting: Nurse Practitioner

## 2019-04-23 ENCOUNTER — Encounter: Payer: Self-pay | Admitting: Nurse Practitioner

## 2019-04-23 VITALS — BP 143/102 | HR 72 | Temp 98.2°F | Resp 17 | Ht 73.0 in | Wt 238.3 lb

## 2019-04-23 VITALS — BP 141/102

## 2019-04-23 DIAGNOSIS — C9000 Multiple myeloma not having achieved remission: Secondary | ICD-10-CM

## 2019-04-23 DIAGNOSIS — Z86718 Personal history of other venous thrombosis and embolism: Secondary | ICD-10-CM | POA: Insufficient documentation

## 2019-04-23 DIAGNOSIS — F329 Major depressive disorder, single episode, unspecified: Secondary | ICD-10-CM | POA: Insufficient documentation

## 2019-04-23 DIAGNOSIS — Z5112 Encounter for antineoplastic immunotherapy: Secondary | ICD-10-CM | POA: Insufficient documentation

## 2019-04-23 DIAGNOSIS — D649 Anemia, unspecified: Secondary | ICD-10-CM | POA: Diagnosis not present

## 2019-04-23 DIAGNOSIS — I10 Essential (primary) hypertension: Secondary | ICD-10-CM | POA: Insufficient documentation

## 2019-04-23 LAB — CMP (CANCER CENTER ONLY)
ALT: 33 U/L (ref 0–44)
AST: 17 U/L (ref 15–41)
Albumin: 4.2 g/dL (ref 3.5–5.0)
Alkaline Phosphatase: 104 U/L (ref 38–126)
Anion gap: 10 (ref 5–15)
BUN: 11 mg/dL (ref 6–20)
CO2: 26 mmol/L (ref 22–32)
Calcium: 9.4 mg/dL (ref 8.9–10.3)
Chloride: 107 mmol/L (ref 98–111)
Creatinine: 1.25 mg/dL — ABNORMAL HIGH (ref 0.61–1.24)
GFR, Est AFR Am: >60 mL/min (ref >60)
GFR, Estimated: >60 mL/min (ref >60)
Glucose, Bld: 100 mg/dL — ABNORMAL HIGH (ref 70–99)
Potassium: 3.7 mmol/L (ref 3.5–5.1)
Sodium: 143 mmol/L (ref 135–145)
Total Bilirubin: 0.4 mg/dL (ref 0.3–1.2)
Total Protein: 6.8 g/dL (ref 6.5–8.1)

## 2019-04-23 LAB — CBC WITH DIFFERENTIAL (CANCER CENTER ONLY)
Abs Immature Granulocytes: 0.12 10*3/uL — ABNORMAL HIGH (ref 0.00–0.07)
Basophils Absolute: 0.1 10*3/uL (ref 0.0–0.1)
Basophils Relative: 1 %
Eosinophils Absolute: 0.2 10*3/uL (ref 0.0–0.5)
Eosinophils Relative: 4 %
HCT: 37.3 % — ABNORMAL LOW (ref 39.0–52.0)
Hemoglobin: 12.6 g/dL — ABNORMAL LOW (ref 13.0–17.0)
Immature Granulocytes: 2 %
Lymphocytes Relative: 28 %
Lymphs Abs: 1.7 10*3/uL (ref 0.7–4.0)
MCH: 28.6 pg (ref 26.0–34.0)
MCHC: 33.8 g/dL (ref 30.0–36.0)
MCV: 84.8 fL (ref 80.0–100.0)
Monocytes Absolute: 0.5 10*3/uL (ref 0.1–1.0)
Monocytes Relative: 9 %
Neutro Abs: 3.3 10*3/uL (ref 1.7–7.7)
Neutrophils Relative %: 56 %
Platelet Count: 366 10*3/uL (ref 150–400)
RBC: 4.4 MIL/uL (ref 4.22–5.81)
RDW: 13.7 % (ref 11.5–15.5)
WBC Count: 5.9 10*3/uL (ref 4.0–10.5)
nRBC: 0 % (ref 0.0–0.2)

## 2019-04-23 MED ORDER — BORTEZOMIB CHEMO SQ INJECTION 3.5 MG (2.5MG/ML)
1.3000 mg/m2 | Freq: Once | INTRAMUSCULAR | Status: AC
  Administered 2019-04-23: 3 mg via SUBCUTANEOUS
  Filled 2019-04-23: qty 1.2

## 2019-04-23 MED ORDER — DEXAMETHASONE 4 MG PO TABS
ORAL_TABLET | ORAL | Status: AC
  Filled 2019-04-23: qty 10

## 2019-04-23 MED ORDER — ONDANSETRON HCL 8 MG PO TABS
4.0000 mg | ORAL_TABLET | Freq: Once | ORAL | Status: AC
  Administered 2019-04-23: 4 mg via ORAL

## 2019-04-23 MED ORDER — ONDANSETRON HCL 8 MG PO TABS
ORAL_TABLET | ORAL | Status: AC
  Filled 2019-04-23: qty 1

## 2019-04-23 MED ORDER — DEXAMETHASONE 4 MG PO TABS
ORAL_TABLET | ORAL | Status: AC
  Filled 2019-04-23: qty 1

## 2019-04-23 MED ORDER — ALPRAZOLAM 0.25 MG PO TABS: 0.2500 mg | ORAL_TABLET | Freq: Two times a day (BID) | ORAL | 0 refills | Status: DC | PRN

## 2019-04-23 MED ORDER — DEXAMETHASONE 4 MG PO TABS
40.0000 mg | ORAL_TABLET | Freq: Once | ORAL | Status: AC
  Administered 2019-04-23: 40 mg via ORAL

## 2019-04-23 NOTE — Patient Instructions (Signed)
[facility] Discharge Instructions for Patients Receiving Chemotherapy  Today you received the following chemotherapy agents: bortezomib.  To help prevent nausea and vomiting after your treatment, we encourage you to take your nausea medication as directed.   If you develop nausea and vomiting that is not controlled by your nausea medication, call the clinic.   BELOW ARE SYMPTOMS THAT SHOULD BE REPORTED IMMEDIATELY:  *FEVER GREATER THAN 100.5 F  *CHILLS WITH OR WITHOUT FEVER  NAUSEA AND VOMITING THAT IS NOT CONTROLLED WITH YOUR NAUSEA MEDICATION  *UNUSUAL SHORTNESS OF BREATH  *UNUSUAL BRUISING OR BLEEDING  TENDERNESS IN MOUTH AND THROAT WITH OR WITHOUT PRESENCE OF ULCERS  *URINARY PROBLEMS  *BOWEL PROBLEMS  UNUSUAL RASH Items with * indicate a potential emergency and should be followed up as soon as possible.  Feel free to call the clinic should you have any questions or concerns. The clinic phone number is [phone].  Please show the CHEMO ALERT CARD at check-in to the Emergency Department and triage nurse.   
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
## 2019-04-23 NOTE — Progress Notes (Signed)
Per [name] NP, ok to treat with elevated BP. No additional orders.

## 2019-04-23 NOTE — Progress Notes (Signed)
Per [name] NP, OK to treat with elevated blood pressure. Pt baseline.

## 2019-04-25 ENCOUNTER — Telehealth: Payer: Self-pay | Admitting: Oncology

## 2019-04-25 NOTE — Telephone Encounter (Signed)
Scheduled per los. Called and spoke with patient. Confirmed all appts  °

## 2019-04-26 ENCOUNTER — Other Ambulatory Visit: Payer: Self-pay | Admitting: Nurse Practitioner

## 2019-04-26 DIAGNOSIS — C9000 Multiple myeloma not having achieved remission: Secondary | ICD-10-CM

## 2019-04-29 ENCOUNTER — Other Ambulatory Visit: Payer: Self-pay | Admitting: Oncology

## 2019-04-30 ENCOUNTER — Inpatient Hospital Stay: Payer: BC Managed Care – PPO

## 2019-04-30 ENCOUNTER — Other Ambulatory Visit: Payer: Self-pay

## 2019-04-30 VITALS — BP 141/93 | HR 79 | Temp 98.7°F | Resp 18

## 2019-04-30 DIAGNOSIS — C9000 Multiple myeloma not having achieved remission: Secondary | ICD-10-CM

## 2019-04-30 LAB — CBC WITH DIFFERENTIAL (CANCER CENTER ONLY)
Abs Immature Granulocytes: 0.11 10*3/uL — ABNORMAL HIGH (ref 0.00–0.07)
Basophils Absolute: 0 10*3/uL (ref 0.0–0.1)
Basophils Relative: 0 %
Eosinophils Absolute: 0.3 10*3/uL (ref 0.0–0.5)
Eosinophils Relative: 4 %
HCT: 37.8 % — ABNORMAL LOW (ref 39.0–52.0)
Hemoglobin: 12.7 g/dL — ABNORMAL LOW (ref 13.0–17.0)
Immature Granulocytes: 2 %
Lymphocytes Relative: 25 %
Lymphs Abs: 1.9 10*3/uL (ref 0.7–4.0)
MCH: 28.2 pg (ref 26.0–34.0)
MCHC: 33.6 g/dL (ref 30.0–36.0)
MCV: 83.8 fL (ref 80.0–100.0)
Monocytes Absolute: 1 10*3/uL (ref 0.1–1.0)
Monocytes Relative: 13 %
Neutro Abs: 4.2 10*3/uL (ref 1.7–7.7)
Neutrophils Relative %: 56 %
Platelet Count: 288 10*3/uL (ref 150–400)
RBC: 4.51 MIL/uL (ref 4.22–5.81)
RDW: 13.5 % (ref 11.5–15.5)
WBC Count: 7.6 10*3/uL (ref 4.0–10.5)
nRBC: 0 % (ref 0.0–0.2)

## 2019-04-30 LAB — CMP (CANCER CENTER ONLY)
ALT: 37 U/L (ref 0–44)
AST: 20 U/L (ref 15–41)
Albumin: 4.1 g/dL (ref 3.5–5.0)
Alkaline Phosphatase: 100 U/L (ref 38–126)
Anion gap: 12 (ref 5–15)
BUN: 12 mg/dL (ref 6–20)
CO2: 23 mmol/L (ref 22–32)
Calcium: 9.2 mg/dL (ref 8.9–10.3)
Chloride: 107 mmol/L (ref 98–111)
Creatinine: 1.32 mg/dL — ABNORMAL HIGH (ref 0.61–1.24)
GFR, Est AFR Am: >60 mL/min (ref >60)
GFR, Estimated: >60 mL/min (ref >60)
Glucose, Bld: 107 mg/dL — ABNORMAL HIGH (ref 70–99)
Potassium: 3.6 mmol/L (ref 3.5–5.1)
Sodium: 142 mmol/L (ref 135–145)
Total Bilirubin: 0.3 mg/dL (ref 0.3–1.2)
Total Protein: 6.9 g/dL (ref 6.5–8.1)

## 2019-04-30 MED ORDER — DEXAMETHASONE 4 MG PO TABS
ORAL_TABLET | ORAL | Status: AC
  Filled 2019-04-30: qty 10

## 2019-04-30 MED ORDER — DEXAMETHASONE 4 MG PO TABS
40.0000 mg | ORAL_TABLET | Freq: Once | ORAL | Status: AC
  Administered 2019-04-30: 40 mg via ORAL

## 2019-04-30 MED ORDER — ONDANSETRON HCL 8 MG PO TABS
4.0000 mg | ORAL_TABLET | Freq: Once | ORAL | Status: AC
  Administered 2019-04-30: 4 mg via ORAL

## 2019-04-30 MED ORDER — ONDANSETRON HCL 8 MG PO TABS
ORAL_TABLET | ORAL | Status: AC
  Filled 2019-04-30: qty 1

## 2019-04-30 MED ORDER — BORTEZOMIB CHEMO SQ INJECTION 3.5 MG (2.5MG/ML)
1.3000 mg/m2 | Freq: Once | INTRAMUSCULAR | Status: AC
  Administered 2019-04-30: 3 mg via SUBCUTANEOUS
  Filled 2019-04-30: qty 1.2

## 2019-04-30 NOTE — Patient Instructions (Signed)
[facility] Discharge Instructions for Patients Receiving Chemotherapy  Today you received the following chemotherapy agents Velcade To help prevent nausea and vomiting after your treatment, we encourage you to take your nausea medication as prescribed.   If you develop nausea and vomiting that is not controlled by your nausea medication, call the clinic.   BELOW ARE SYMPTOMS THAT SHOULD BE REPORTED IMMEDIATELY:  *FEVER GREATER THAN 100.5 F  *CHILLS WITH OR WITHOUT FEVER  NAUSEA AND VOMITING THAT IS NOT CONTROLLED WITH YOUR NAUSEA MEDICATION  *UNUSUAL SHORTNESS OF BREATH  *UNUSUAL BRUISING OR BLEEDING  TENDERNESS IN MOUTH AND THROAT WITH OR WITHOUT PRESENCE OF ULCERS  *URINARY PROBLEMS  *BOWEL PROBLEMS  UNUSUAL RASH Items with * indicate a potential emergency and should be followed up as soon as possible.  Feel free to call the clinic should you have any questions or concerns. The clinic phone number is [phone].  Please show the CHEMO ALERT CARD at check-in to the Emergency Department and triage nurse.   

## 2019-05-05 ENCOUNTER — Other Ambulatory Visit: Payer: Self-pay | Admitting: Nurse Practitioner

## 2019-05-05 DIAGNOSIS — C9000 Multiple myeloma not having achieved remission: Secondary | ICD-10-CM

## 2019-05-11 ENCOUNTER — Other Ambulatory Visit: Payer: Self-pay | Admitting: *Deleted

## 2019-05-11 DIAGNOSIS — C9000 Multiple myeloma not having achieved remission: Secondary | ICD-10-CM

## 2019-05-11 MED ORDER — LENALIDOMIDE 15 MG PO CAPS: ORAL_CAPSULE | ORAL | 0 refills | Status: DC

## 2019-05-13 ENCOUNTER — Other Ambulatory Visit: Payer: Self-pay | Admitting: Oncology

## 2019-05-14 ENCOUNTER — Inpatient Hospital Stay: Payer: BC Managed Care – PPO | Admitting: Oncology

## 2019-05-14 ENCOUNTER — Inpatient Hospital Stay: Payer: BC Managed Care – PPO

## 2019-05-14 ENCOUNTER — Telehealth: Payer: Self-pay

## 2019-05-14 NOTE — Telephone Encounter (Signed)
Unable to reach patient.

## 2019-05-19 ENCOUNTER — Other Ambulatory Visit: Payer: Self-pay | Admitting: Oncology

## 2019-05-21 ENCOUNTER — Inpatient Hospital Stay: Payer: BC Managed Care – PPO

## 2019-05-21 ENCOUNTER — Inpatient Hospital Stay: Payer: BC Managed Care – PPO | Attending: Oncology

## 2019-05-21 ENCOUNTER — Other Ambulatory Visit: Payer: Self-pay

## 2019-05-21 VITALS — BP 134/89 | HR 75 | Temp 99.1°F | Resp 18 | Wt 244.5 lb

## 2019-05-21 DIAGNOSIS — Z5112 Encounter for antineoplastic immunotherapy: Secondary | ICD-10-CM | POA: Insufficient documentation

## 2019-05-21 DIAGNOSIS — C9000 Multiple myeloma not having achieved remission: Secondary | ICD-10-CM

## 2019-05-21 LAB — CMP (CANCER CENTER ONLY)
ALT: 23 U/L (ref 0–44)
AST: 20 U/L (ref 15–41)
Albumin: 4.1 g/dL (ref 3.5–5.0)
Alkaline Phosphatase: 106 U/L (ref 38–126)
Anion gap: 11 (ref 5–15)
BUN: 12 mg/dL (ref 6–20)
CO2: 24 mmol/L (ref 22–32)
Calcium: 9.4 mg/dL (ref 8.9–10.3)
Chloride: 108 mmol/L (ref 98–111)
Creatinine: 1.37 mg/dL — ABNORMAL HIGH (ref 0.61–1.24)
GFR, Est AFR Am: >60 mL/min
GFR, Estimated: >60 mL/min
Glucose, Bld: 102 mg/dL — ABNORMAL HIGH (ref 70–99)
Potassium: 3.8 mmol/L (ref 3.5–5.1)
Sodium: 143 mmol/L (ref 135–145)
Total Bilirubin: 0.4 mg/dL (ref 0.3–1.2)
Total Protein: 7 g/dL (ref 6.5–8.1)

## 2019-05-21 LAB — CBC WITH DIFFERENTIAL (CANCER CENTER ONLY)
Abs Immature Granulocytes: 0.01 10*3/uL (ref 0.00–0.07)
Basophils Absolute: 0.2 10*3/uL — ABNORMAL HIGH (ref 0.0–0.1)
Basophils Relative: 3 %
Eosinophils Absolute: 0.1 10*3/uL (ref 0.0–0.5)
Eosinophils Relative: 2 %
HCT: 38.3 % — ABNORMAL LOW (ref 39.0–52.0)
Hemoglobin: 12.9 g/dL — ABNORMAL LOW (ref 13.0–17.0)
Immature Granulocytes: 0 %
Lymphocytes Relative: 45 %
Lymphs Abs: 2.2 10*3/uL (ref 0.7–4.0)
MCH: 27.8 pg (ref 26.0–34.0)
MCHC: 33.7 g/dL (ref 30.0–36.0)
MCV: 82.5 fL (ref 80.0–100.0)
Monocytes Absolute: 0.8 10*3/uL (ref 0.1–1.0)
Monocytes Relative: 17 %
Neutro Abs: 1.6 10*3/uL — ABNORMAL LOW (ref 1.7–7.7)
Neutrophils Relative %: 33 %
Platelet Count: 346 10*3/uL (ref 150–400)
RBC: 4.64 MIL/uL (ref 4.22–5.81)
RDW: 13.5 % (ref 11.5–15.5)
WBC Count: 4.8 10*3/uL (ref 4.0–10.5)
nRBC: 0 % (ref 0.0–0.2)

## 2019-05-21 MED ORDER — ONDANSETRON HCL 8 MG PO TABS
ORAL_TABLET | ORAL | Status: AC
  Filled 2019-05-21: qty 1

## 2019-05-21 MED ORDER — ONDANSETRON HCL 8 MG PO TABS
4.0000 mg | ORAL_TABLET | Freq: Once | ORAL | Status: AC
  Administered 2019-05-21: 4 mg via ORAL

## 2019-05-21 MED ORDER — DEXAMETHASONE 4 MG PO TABS
40.0000 mg | ORAL_TABLET | Freq: Once | ORAL | Status: AC
  Administered 2019-05-21: 40 mg via ORAL

## 2019-05-21 MED ORDER — BORTEZOMIB CHEMO SQ INJECTION 3.5 MG (2.5MG/ML)
1.3000 mg/m2 | Freq: Once | INTRAMUSCULAR | Status: AC
  Administered 2019-05-21: 3 mg via SUBCUTANEOUS
  Filled 2019-05-21: qty 1.2

## 2019-05-21 MED ORDER — DEXAMETHASONE 4 MG PO TABS
ORAL_TABLET | ORAL | Status: AC
  Filled 2019-05-21: qty 10

## 2019-05-21 NOTE — Patient Instructions (Signed)
[facility] Discharge Instructions for Patients Receiving Chemotherapy  Today you received the following chemotherapy agents Velcade To help prevent nausea and vomiting after your treatment, we encourage you to take your nausea medication as prescribed.   If you develop nausea and vomiting that is not controlled by your nausea medication, call the clinic.   BELOW ARE SYMPTOMS THAT SHOULD BE REPORTED IMMEDIATELY:  *FEVER GREATER THAN 100.5 F  *CHILLS WITH OR WITHOUT FEVER  NAUSEA AND VOMITING THAT IS NOT CONTROLLED WITH YOUR NAUSEA MEDICATION  *UNUSUAL SHORTNESS OF BREATH  *UNUSUAL BRUISING OR BLEEDING  TENDERNESS IN MOUTH AND THROAT WITH OR WITHOUT PRESENCE OF ULCERS  *URINARY PROBLEMS  *BOWEL PROBLEMS  UNUSUAL RASH Items with * indicate a potential emergency and should be followed up as soon as possible.  Feel free to call the clinic should you have any questions or concerns. The clinic phone number is [phone].  Please show the CHEMO ALERT CARD at check-in to the Emergency Department and triage nurse.   

## 2019-05-23 ENCOUNTER — Encounter: Payer: Self-pay | Admitting: Oncology

## 2019-05-23 NOTE — Progress Notes (Signed)
Pt was approved w/ [employer] to assist w/ ins premiums and qualifying treatment expenses for 11,000 from 04/23/19 to 04/21/20.

## 2019-05-28 ENCOUNTER — Other Ambulatory Visit: Payer: Self-pay

## 2019-05-28 ENCOUNTER — Inpatient Hospital Stay: Payer: BC Managed Care – PPO

## 2019-05-28 VITALS — BP 137/91 | HR 86 | Temp 98.5°F | Resp 20

## 2019-05-28 DIAGNOSIS — C9000 Multiple myeloma not having achieved remission: Secondary | ICD-10-CM | POA: Diagnosis not present

## 2019-05-28 LAB — CMP (CANCER CENTER ONLY)
ALT: 27 U/L (ref 0–44)
AST: 19 U/L (ref 15–41)
Albumin: 4.3 g/dL (ref 3.5–5.0)
Alkaline Phosphatase: 100 U/L (ref 38–126)
Anion gap: 11 (ref 5–15)
BUN: 11 mg/dL (ref 6–20)
CO2: 24 mmol/L (ref 22–32)
Calcium: 9.7 mg/dL (ref 8.9–10.3)
Chloride: 107 mmol/L (ref 98–111)
Creatinine: 1.31 mg/dL — ABNORMAL HIGH (ref 0.61–1.24)
GFR, Est AFR Am: >60 mL/min (ref >60)
GFR, Estimated: >60 mL/min (ref >60)
Glucose, Bld: 88 mg/dL (ref 70–99)
Potassium: 3.6 mmol/L (ref 3.5–5.1)
Sodium: 142 mmol/L (ref 135–145)
Total Bilirubin: 0.4 mg/dL (ref 0.3–1.2)
Total Protein: 7 g/dL (ref 6.5–8.1)

## 2019-05-28 LAB — CBC WITH DIFFERENTIAL (CANCER CENTER ONLY)
Abs Immature Granulocytes: 0.07 10*3/uL (ref 0.00–0.07)
Basophils Absolute: 0.1 10*3/uL (ref 0.0–0.1)
Basophils Relative: 1 %
Eosinophils Absolute: 0.1 10*3/uL (ref 0.0–0.5)
Eosinophils Relative: 2 %
HCT: 37.8 % — ABNORMAL LOW (ref 39.0–52.0)
Hemoglobin: 12.8 g/dL — ABNORMAL LOW (ref 13.0–17.0)
Immature Granulocytes: 1 %
Lymphocytes Relative: 27 %
Lymphs Abs: 1.5 10*3/uL (ref 0.7–4.0)
MCH: 27.6 pg (ref 26.0–34.0)
MCHC: 33.9 g/dL (ref 30.0–36.0)
MCV: 81.6 fL (ref 80.0–100.0)
Monocytes Absolute: 0.5 10*3/uL (ref 0.1–1.0)
Monocytes Relative: 9 %
Neutro Abs: 3.5 10*3/uL (ref 1.7–7.7)
Neutrophils Relative %: 60 %
Platelet Count: 360 10*3/uL (ref 150–400)
RBC: 4.63 MIL/uL (ref 4.22–5.81)
RDW: 13.7 % (ref 11.5–15.5)
WBC Count: 5.8 10*3/uL (ref 4.0–10.5)
nRBC: 0 % (ref 0.0–0.2)

## 2019-05-28 MED ORDER — DEXAMETHASONE 4 MG PO TABS
ORAL_TABLET | ORAL | Status: AC
  Filled 2019-05-28: qty 1

## 2019-05-28 MED ORDER — DEXAMETHASONE 4 MG PO TABS
ORAL_TABLET | ORAL | Status: AC
  Filled 2019-05-28: qty 10

## 2019-05-28 MED ORDER — ONDANSETRON HCL 8 MG PO TABS
ORAL_TABLET | ORAL | Status: AC
  Filled 2019-05-28: qty 1

## 2019-05-28 MED ORDER — ONDANSETRON HCL 8 MG PO TABS
4.0000 mg | ORAL_TABLET | Freq: Once | ORAL | Status: AC
  Administered 2019-05-28: 4 mg via ORAL

## 2019-05-28 MED ORDER — DEXAMETHASONE 4 MG PO TABS
40.0000 mg | ORAL_TABLET | Freq: Once | ORAL | Status: AC
  Administered 2019-05-28: 16:00:00 40 mg via ORAL

## 2019-05-28 MED ORDER — BORTEZOMIB CHEMO SQ INJECTION 3.5 MG (2.5MG/ML)
1.3000 mg/m2 | Freq: Once | INTRAMUSCULAR | Status: AC
  Administered 2019-05-28: 17:00:00 3 mg via SUBCUTANEOUS
  Filled 2019-05-28: qty 1.2

## 2019-05-28 NOTE — Patient Instructions (Signed)
[facility] Discharge Instructions for Patients Receiving Chemotherapy  Today you received the following chemotherapy agents Velcade To help prevent nausea and vomiting after your treatment, we encourage you to take your nausea medication as prescribed.   If you develop nausea and vomiting that is not controlled by your nausea medication, call the clinic.   BELOW ARE SYMPTOMS THAT SHOULD BE REPORTED IMMEDIATELY:  *FEVER GREATER THAN 100.5 F  *CHILLS WITH OR WITHOUT FEVER  NAUSEA AND VOMITING THAT IS NOT CONTROLLED WITH YOUR NAUSEA MEDICATION  *UNUSUAL SHORTNESS OF BREATH  *UNUSUAL BRUISING OR BLEEDING  TENDERNESS IN MOUTH AND THROAT WITH OR WITHOUT PRESENCE OF ULCERS  *URINARY PROBLEMS  *BOWEL PROBLEMS  UNUSUAL RASH Items with * indicate a potential emergency and should be followed up as soon as possible.  Feel free to call the clinic should you have any questions or concerns. The clinic phone number is [phone].  Please show the CHEMO ALERT CARD at check-in to the Emergency Department and triage nurse.   

## 2019-05-29 LAB — KAPPA/LAMBDA LIGHT CHAINS
Kappa free light chain: 212.7 mg/L — ABNORMAL HIGH (ref 3.3–19.4)
Kappa, lambda light chain ratio: 18.66 — ABNORMAL HIGH (ref 0.26–1.65)
Lambda free light chains: 11.4 mg/L (ref 5.7–26.3)

## 2019-05-30 ENCOUNTER — Encounter: Payer: Self-pay | Admitting: *Deleted

## 2019-05-30 LAB — PROTEIN ELECTROPHORESIS, SERUM
A/G Ratio: 1.5 (ref 0.7–1.7)
Albumin ELP: 4 g/dL (ref 2.9–4.4)
Alpha-1-Globulin: 0.2 g/dL (ref 0.0–0.4)
Alpha-2-Globulin: 0.8 g/dL (ref 0.4–1.0)
Beta Globulin: 1.2 g/dL (ref 0.7–1.3)
Gamma Globulin: 0.4 g/dL (ref 0.4–1.8)
Globulin, Total: 2.7 g/dL (ref 2.2–3.9)
Total Protein ELP: 6.7 g/dL (ref 6.0–8.5)

## 2019-05-30 NOTE — Progress Notes (Addendum)
Patient dropped off paperwork unsigned that [employer] is requesting records. Already has signed ROI for this company in media tab. Call from Raoul Pitch that they need records from 10/2018 to present. Notified HIM. Fax [phone] Per Dalene Seltzer in HIM, our 3rd party [employer]ozelle Logan has also received this request and notified [employer]. The department will be notified of specific dates for records that are requested.

## 2019-06-01 ENCOUNTER — Telehealth: Payer: Self-pay | Admitting: Oncology

## 2019-06-01 NOTE — Telephone Encounter (Signed)
Scheduled appt 3/12 sch msg - but cancelled per pt unable to come in on 3/15 and can only do afternoon- message sent to RN to see when to schedule appt

## 2019-06-01 NOTE — Telephone Encounter (Signed)
Scheduled appt on 3/147 per Manuela Schwartz RN okay to schedule with Lattie Haw - pt aware of appt date and time

## 2019-06-03 ENCOUNTER — Other Ambulatory Visit: Payer: Self-pay | Admitting: Nurse Practitioner

## 2019-06-03 DIAGNOSIS — C9000 Multiple myeloma not having achieved remission: Secondary | ICD-10-CM

## 2019-06-04 ENCOUNTER — Other Ambulatory Visit: Payer: BC Managed Care – PPO

## 2019-06-04 ENCOUNTER — Ambulatory Visit: Payer: BC Managed Care – PPO

## 2019-06-04 ENCOUNTER — Ambulatory Visit: Payer: BC Managed Care – PPO | Admitting: Oncology

## 2019-06-06 ENCOUNTER — Inpatient Hospital Stay: Payer: BC Managed Care – PPO

## 2019-06-06 ENCOUNTER — Other Ambulatory Visit: Payer: Self-pay | Admitting: *Deleted

## 2019-06-06 ENCOUNTER — Telehealth: Payer: Self-pay | Admitting: Nurse Practitioner

## 2019-06-06 ENCOUNTER — Telehealth: Payer: Self-pay

## 2019-06-06 ENCOUNTER — Inpatient Hospital Stay: Payer: BC Managed Care – PPO | Admitting: Nurse Practitioner

## 2019-06-06 DIAGNOSIS — C9000 Multiple myeloma not having achieved remission: Secondary | ICD-10-CM

## 2019-06-06 NOTE — Telephone Encounter (Signed)
R/s appt per 3/16 sch message - unable to reach pt . Left message with appt date and time

## 2019-06-06 NOTE — Telephone Encounter (Signed)
Spoke with patient regarding his appointment change for tomorrow. Pt verbalizes understanding and states that he will be at [facility] at 1:45 pm on 06/07/19 for his appointments.

## 2019-06-07 ENCOUNTER — Encounter: Payer: Self-pay | Admitting: Nurse Practitioner

## 2019-06-07 ENCOUNTER — Inpatient Hospital Stay (HOSPITAL_BASED_OUTPATIENT_CLINIC_OR_DEPARTMENT_OTHER): Payer: BC Managed Care – PPO | Admitting: Nurse Practitioner

## 2019-06-07 ENCOUNTER — Other Ambulatory Visit: Payer: Self-pay

## 2019-06-07 ENCOUNTER — Inpatient Hospital Stay: Payer: BC Managed Care – PPO

## 2019-06-07 VITALS — BP 131/97 | HR 70 | Temp 98.5°F | Resp 18 | Ht 73.0 in | Wt 236.9 lb

## 2019-06-07 DIAGNOSIS — C9000 Multiple myeloma not having achieved remission: Secondary | ICD-10-CM

## 2019-06-07 LAB — CMP (CANCER CENTER ONLY)
ALT: 31 U/L (ref 0–44)
AST: 19 U/L (ref 15–41)
Albumin: 4.2 g/dL (ref 3.5–5.0)
Alkaline Phosphatase: 88 U/L (ref 38–126)
Anion gap: 10 (ref 5–15)
BUN: 9 mg/dL (ref 6–20)
CO2: 24 mmol/L (ref 22–32)
Calcium: 9.4 mg/dL (ref 8.9–10.3)
Chloride: 107 mmol/L (ref 98–111)
Creatinine: 1.34 mg/dL — ABNORMAL HIGH (ref 0.61–1.24)
GFR, Est AFR Am: >60 mL/min (ref >60)
GFR, Estimated: >60 mL/min (ref >60)
Glucose, Bld: 89 mg/dL (ref 70–99)
Potassium: 3.6 mmol/L (ref 3.5–5.1)
Sodium: 141 mmol/L (ref 135–145)
Total Bilirubin: 0.6 mg/dL (ref 0.3–1.2)
Total Protein: 6.9 g/dL (ref 6.5–8.1)

## 2019-06-07 LAB — CBC WITH DIFFERENTIAL (CANCER CENTER ONLY)
Abs Immature Granulocytes: 0.06 10*3/uL (ref 0.00–0.07)
Basophils Absolute: 0.1 10*3/uL (ref 0.0–0.1)
Basophils Relative: 1 %
Eosinophils Absolute: 0.2 10*3/uL (ref 0.0–0.5)
Eosinophils Relative: 3 %
HCT: 39.9 % (ref 39.0–52.0)
Hemoglobin: 13.5 g/dL (ref 13.0–17.0)
Immature Granulocytes: 1 %
Lymphocytes Relative: 25 %
Lymphs Abs: 1.5 10*3/uL (ref 0.7–4.0)
MCH: 27.6 pg (ref 26.0–34.0)
MCHC: 33.8 g/dL (ref 30.0–36.0)
MCV: 81.6 fL (ref 80.0–100.0)
Monocytes Absolute: 0.7 10*3/uL (ref 0.1–1.0)
Monocytes Relative: 12 %
Neutro Abs: 3.4 10*3/uL (ref 1.7–7.7)
Neutrophils Relative %: 58 %
Platelet Count: 270 10*3/uL (ref 150–400)
RBC: 4.89 MIL/uL (ref 4.22–5.81)
RDW: 13.9 % (ref 11.5–15.5)
WBC Count: 5.9 10*3/uL (ref 4.0–10.5)
nRBC: 0 % (ref 0.0–0.2)

## 2019-06-07 MED ORDER — BORTEZOMIB CHEMO SQ INJECTION 3.5 MG (2.5MG/ML)
1.3000 mg/m2 | Freq: Once | INTRAMUSCULAR | Status: AC
  Administered 2019-06-07: 3 mg via SUBCUTANEOUS
  Filled 2019-06-07: qty 1.2

## 2019-06-07 MED ORDER — DEXAMETHASONE 4 MG PO TABS
40.0000 mg | ORAL_TABLET | Freq: Once | ORAL | Status: AC
  Administered 2019-06-07: 40 mg via ORAL

## 2019-06-07 MED ORDER — DEXAMETHASONE 4 MG PO TABS
ORAL_TABLET | ORAL | Status: AC
  Filled 2019-06-07: qty 10

## 2019-06-07 MED ORDER — ONDANSETRON HCL 8 MG PO TABS
ORAL_TABLET | ORAL | Status: AC
  Filled 2019-06-07: qty 1

## 2019-06-07 MED ORDER — ONDANSETRON HCL 8 MG PO TABS
4.0000 mg | ORAL_TABLET | Freq: Once | ORAL | Status: AC
  Administered 2019-06-07: 4 mg via ORAL
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
## 2019-06-07 NOTE — Patient Instructions (Signed)
[facility] Discharge Instructions for Patients Receiving Chemotherapy  Today you received the following chemotherapy agents Bortezomib (VELCADE).  To help prevent nausea and vomiting after your treatment, we encourage you to take your nausea medication as prescribed.   If you develop nausea and vomiting that is not controlled by your nausea medication, call the clinic.   BELOW ARE SYMPTOMS THAT SHOULD BE REPORTED IMMEDIATELY:  *FEVER GREATER THAN 100.5 F  *CHILLS WITH OR WITHOUT FEVER  NAUSEA AND VOMITING THAT IS NOT CONTROLLED WITH YOUR NAUSEA MEDICATION  *UNUSUAL SHORTNESS OF BREATH  *UNUSUAL BRUISING OR BLEEDING  TENDERNESS IN MOUTH AND THROAT WITH OR WITHOUT PRESENCE OF ULCERS  *URINARY PROBLEMS  *BOWEL PROBLEMS  UNUSUAL RASH Items with * indicate a potential emergency and should be followed up as soon as possible.  Feel free to call the clinic should you have any questions or concerns. The clinic phone number is [phone].  Please show the CHEMO ALERT CARD at check-in to the Emergency Department and triage nurse.   

## 2019-06-08 ENCOUNTER — Other Ambulatory Visit: Payer: Self-pay | Admitting: *Deleted

## 2019-06-08 DIAGNOSIS — C9000 Multiple myeloma not having achieved remission: Secondary | ICD-10-CM

## 2019-06-08 MED ORDER — APIXABAN 5 MG PO TABS: 5.0000 mg | ORAL_TABLET | Freq: Two times a day (BID) | ORAL | 2 refills | Status: DC

## 2019-06-09 ENCOUNTER — Other Ambulatory Visit: Payer: Self-pay | Admitting: Oncology

## 2019-06-11 ENCOUNTER — Telehealth: Payer: Self-pay | Admitting: Oncology

## 2019-06-11 NOTE — Telephone Encounter (Signed)
Scheduled per los. Called and left msg. Mailed printout  °

## 2019-06-22 ENCOUNTER — Other Ambulatory Visit: Payer: Self-pay | Admitting: *Deleted

## 2019-06-22 DIAGNOSIS — C9000 Multiple myeloma not having achieved remission: Secondary | ICD-10-CM

## 2019-06-22 MED ORDER — AMLODIPINE BESYLATE 5 MG PO TABS: ORAL_TABLET | ORAL | 1 refills | Status: DC

## 2019-06-28 ENCOUNTER — Telehealth: Payer: Self-pay | Admitting: *Deleted

## 2019-06-28 NOTE — Telephone Encounter (Signed)
Well into process for BMT and will be with them on 07/05/19. OK to cancel his appointments in [place]? OK per Dr. Benay Spice. Wells Guiles will tell patient. Most likely will be ready to see [facility] in mid to late May. Will keep [place] posted.

## 2019-07-05 ENCOUNTER — Other Ambulatory Visit: Payer: BC Managed Care – PPO

## 2019-07-05 ENCOUNTER — Ambulatory Visit: Payer: BC Managed Care – PPO | Admitting: Oncology

## 2019-07-13 ENCOUNTER — Telehealth: Payer: Self-pay

## 2019-07-13 ENCOUNTER — Other Ambulatory Visit: Payer: Self-pay

## 2019-07-13 NOTE — Telephone Encounter (Signed)
Spoke with pt regarding his amlodipine prescription. Informed pt that he should have 1 refill to pick up at his pharmacy already. Pt verbalizes understanding and will call [place] with any further questions/concerns.

## 2019-07-17 ENCOUNTER — Other Ambulatory Visit: Payer: Self-pay | Admitting: Nurse Practitioner

## 2019-07-17 ENCOUNTER — Telehealth: Payer: Self-pay | Admitting: *Deleted

## 2019-07-17 DIAGNOSIS — C9000 Multiple myeloma not having achieved remission: Secondary | ICD-10-CM

## 2019-07-17 NOTE — Telephone Encounter (Signed)
Requesting refill on xanax. Was instructed to have [facility] physician refill this since it was done on 06/19/19 by Dr. Theo Dills at [facility].

## 2019-07-24 ENCOUNTER — Other Ambulatory Visit: Payer: Self-pay | Admitting: Nurse Practitioner

## 2019-07-24 DIAGNOSIS — C9000 Multiple myeloma not having achieved remission: Secondary | ICD-10-CM

## 2019-08-01 ENCOUNTER — Telehealth: Payer: Self-pay | Admitting: *Deleted

## 2019-08-01 DIAGNOSIS — C9000 Multiple myeloma not having achieved remission: Secondary | ICD-10-CM

## 2019-08-01 NOTE — Telephone Encounter (Signed)
Received fax and spoke w/transplant nurse, Elmyra Ricks. Needs labs on 08/10/19 and on 60 day post transplant as well. 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 His line will be pulled today, so he will have no port or PICC. Scheduled for 5/21 for lab/OV. Elmyra Ricks will inform patient upon his d/c tomorrow. Blood products will require to be leukoreduced and irradiated.

## 2019-08-03 ENCOUNTER — Telehealth: Payer: Self-pay | Admitting: Oncology

## 2019-08-03 NOTE — Telephone Encounter (Signed)
Cancelled lab appt per 5/14 sch message - unable to reach pt - left message with appt date and time

## 2019-08-06 ENCOUNTER — Telehealth: Payer: Self-pay | Admitting: *Deleted

## 2019-08-06 NOTE — Telephone Encounter (Signed)
Does not require lab/OV with Dr. Benay Spice in May. They will wait to pull his line on 5/20 after labs/OV at [facility]. They will also see him in early June. Asking for Dr. Benay Spice to check labs/OV on transplant day 60=09/17/19. Appointments scheduled and Elmyra Ricks is aware and will inform patient. This RN notified him as well.

## 2019-08-10 ENCOUNTER — Inpatient Hospital Stay: Payer: BC Managed Care – PPO | Admitting: Nurse Practitioner

## 2019-08-10 ENCOUNTER — Inpatient Hospital Stay: Payer: BC Managed Care – PPO

## 2019-08-15 ENCOUNTER — Other Ambulatory Visit: Payer: Self-pay | Admitting: Oncology

## 2019-08-16 ENCOUNTER — Other Ambulatory Visit: Payer: Self-pay | Admitting: Oncology

## 2019-08-16 ENCOUNTER — Other Ambulatory Visit: Payer: Self-pay | Admitting: Nurse Practitioner

## 2019-08-16 DIAGNOSIS — C9000 Multiple myeloma not having achieved remission: Secondary | ICD-10-CM

## 2019-08-19 ENCOUNTER — Other Ambulatory Visit: Payer: Self-pay | Admitting: Nurse Practitioner

## 2019-08-19 DIAGNOSIS — C9000 Multiple myeloma not having achieved remission: Secondary | ICD-10-CM

## 2019-08-21 ENCOUNTER — Encounter: Payer: Self-pay | Admitting: *Deleted

## 2019-08-21 NOTE — Progress Notes (Signed)
Received refill request for mycelex lozenges (last ordered on 03/20/2019). Patient is not on steroids per [facility] encounter. Faxed refill request to [facility] as denied with note to f/u with Dr. Guadlupe Spanish at [facility] regarding this refill at [phone].

## 2019-09-02 ENCOUNTER — Other Ambulatory Visit: Payer: Self-pay | Admitting: Nurse Practitioner

## 2019-09-02 DIAGNOSIS — C9000 Multiple myeloma not having achieved remission: Secondary | ICD-10-CM

## 2019-09-05 ENCOUNTER — Other Ambulatory Visit: Payer: Self-pay | Admitting: *Deleted

## 2019-09-05 DIAGNOSIS — C9000 Multiple myeloma not having achieved remission: Secondary | ICD-10-CM

## 2019-09-05 MED ORDER — AMLODIPINE BESYLATE 5 MG PO TABS: ORAL_TABLET | ORAL | 2 refills | Status: DC

## 2019-09-17 ENCOUNTER — Encounter: Payer: Self-pay | Admitting: *Deleted

## 2019-09-17 ENCOUNTER — Inpatient Hospital Stay: Payer: BC Managed Care – PPO | Attending: Oncology | Admitting: Oncology

## 2019-09-17 ENCOUNTER — Other Ambulatory Visit: Payer: Self-pay | Admitting: *Deleted

## 2019-09-17 ENCOUNTER — Inpatient Hospital Stay: Payer: BC Managed Care – PPO

## 2019-09-17 ENCOUNTER — Other Ambulatory Visit: Payer: Self-pay

## 2019-09-17 DIAGNOSIS — C9000 Multiple myeloma not having achieved remission: Secondary | ICD-10-CM | POA: Diagnosis present

## 2019-09-17 DIAGNOSIS — R0789 Other chest pain: Secondary | ICD-10-CM | POA: Diagnosis not present

## 2019-09-17 DIAGNOSIS — Z86718 Personal history of other venous thrombosis and embolism: Secondary | ICD-10-CM | POA: Insufficient documentation

## 2019-09-17 DIAGNOSIS — D649 Anemia, unspecified: Secondary | ICD-10-CM | POA: Diagnosis not present

## 2019-09-17 LAB — CBC WITH DIFFERENTIAL (CANCER CENTER ONLY)
Abs Immature Granulocytes: 0.02 10*3/uL (ref 0.00–0.07)
Basophils Absolute: 0.1 10*3/uL (ref 0.0–0.1)
Basophils Relative: 1 %
Eosinophils Absolute: 0.1 10*3/uL (ref 0.0–0.5)
Eosinophils Relative: 2 %
HCT: 36.6 % — ABNORMAL LOW (ref 39.0–52.0)
Hemoglobin: 12.4 g/dL — ABNORMAL LOW (ref 13.0–17.0)
Immature Granulocytes: 0 %
Lymphocytes Relative: 31 %
Lymphs Abs: 1.6 10*3/uL (ref 0.7–4.0)
MCH: 27.9 pg (ref 26.0–34.0)
MCHC: 33.9 g/dL (ref 30.0–36.0)
MCV: 82.2 fL (ref 80.0–100.0)
Monocytes Absolute: 0.6 10*3/uL (ref 0.1–1.0)
Monocytes Relative: 11 %
Neutro Abs: 2.8 10*3/uL (ref 1.7–7.7)
Neutrophils Relative %: 55 %
Platelet Count: 214 10*3/uL (ref 150–400)
RBC: 4.45 MIL/uL (ref 4.22–5.81)
RDW: 15.2 % (ref 11.5–15.5)
WBC Count: 5.1 10*3/uL (ref 4.0–10.5)
nRBC: 0 % (ref 0.0–0.2)

## 2019-09-17 LAB — CMP (CANCER CENTER ONLY)
ALT: 35 U/L (ref 0–44)
AST: 26 U/L (ref 15–41)
Albumin: 4.3 g/dL (ref 3.5–5.0)
Alkaline Phosphatase: 132 U/L — ABNORMAL HIGH (ref 38–126)
Anion gap: 13 (ref 5–15)
BUN: 13 mg/dL (ref 6–20)
CO2: 20 mmol/L — ABNORMAL LOW (ref 22–32)
Calcium: 10 mg/dL (ref 8.9–10.3)
Chloride: 109 mmol/L (ref 98–111)
Creatinine: 1.41 mg/dL — ABNORMAL HIGH (ref 0.61–1.24)
GFR, Est AFR Am: >60 mL/min (ref >60)
GFR, Estimated: 60 mL/min — ABNORMAL LOW (ref >60)
Glucose, Bld: 97 mg/dL (ref 70–99)
Potassium: 4 mmol/L (ref 3.5–5.1)
Sodium: 142 mmol/L (ref 135–145)
Total Bilirubin: 0.5 mg/dL (ref 0.3–1.2)
Total Protein: 7.2 g/dL (ref 6.5–8.1)

## 2019-09-17 LAB — MAGNESIUM: Magnesium: 1.6 mg/dL — ABNORMAL LOW (ref 1.7–2.4)

## 2019-09-17 LAB — SAMPLE TO BLOOD BANK

## 2019-09-17 MED ORDER — ACYCLOVIR 800 MG PO TABS: 800.0000 mg | ORAL_TABLET | Freq: Two times a day (BID) | ORAL | 1 refills | Status: DC

## 2019-09-17 MED ORDER — APIXABAN 5 MG PO TABS: 5.0000 mg | ORAL_TABLET | Freq: Two times a day (BID) | ORAL | 1 refills | Status: DC

## 2019-09-17 MED ORDER — POTASSIUM CHLORIDE CRYS ER 20 MEQ PO TBCR: EXTENDED_RELEASE_TABLET | ORAL | 0 refills | Status: DC

## 2019-09-17 MED ORDER — AMLODIPINE BESYLATE 5 MG PO TABS: ORAL_TABLET | ORAL | 1 refills | Status: DC

## 2019-09-17 MED ORDER — SULFAMETHOXAZOLE-TRIMETHOPRIM 800-160 MG PO TABS: 1.0000 | ORAL_TABLET | ORAL | 0 refills | Status: DC
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
## 2019-10-17 ENCOUNTER — Other Ambulatory Visit: Payer: Self-pay

## 2019-10-17 ENCOUNTER — Other Ambulatory Visit: Payer: Self-pay | Admitting: Oncology

## 2019-10-17 NOTE — Telephone Encounter (Signed)
Lopressor refilled as requested

## 2019-10-22 ENCOUNTER — Other Ambulatory Visit: Payer: Self-pay | Admitting: *Deleted

## 2019-10-22 MED ORDER — METOPROLOL TARTRATE 25 MG PO TABS: ORAL_TABLET | ORAL | 1 refills | Status: DC

## 2019-10-22 NOTE — Progress Notes (Signed)
Received fax from pharmacy that insurance requests 90 day supply.

## 2019-11-05 ENCOUNTER — Inpatient Hospital Stay: Payer: BC Managed Care – PPO | Attending: Oncology | Admitting: Nurse Practitioner

## 2019-11-05 ENCOUNTER — Telehealth: Payer: Self-pay | Admitting: Oncology

## 2019-11-05 ENCOUNTER — Encounter: Payer: Self-pay | Admitting: Nurse Practitioner

## 2019-11-05 ENCOUNTER — Other Ambulatory Visit: Payer: Self-pay

## 2019-11-05 VITALS — BP 139/95 | HR 76 | Temp 96.5°F | Resp 18 | Ht 73.0 in | Wt 240.2 lb

## 2019-11-05 DIAGNOSIS — Z86718 Personal history of other venous thrombosis and embolism: Secondary | ICD-10-CM | POA: Diagnosis present

## 2019-11-05 DIAGNOSIS — Z5112 Encounter for antineoplastic immunotherapy: Secondary | ICD-10-CM | POA: Insufficient documentation

## 2019-11-05 DIAGNOSIS — D649 Anemia, unspecified: Secondary | ICD-10-CM | POA: Diagnosis not present

## 2019-11-05 DIAGNOSIS — C9000 Multiple myeloma not having achieved remission: Secondary | ICD-10-CM | POA: Insufficient documentation
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
## 2019-11-05 NOTE — Telephone Encounter (Signed)
Scheduled appointments per 8/16 los. Left message for patient with appointments dates and times.

## 2019-11-06 ENCOUNTER — Other Ambulatory Visit: Payer: Self-pay

## 2019-11-06 ENCOUNTER — Other Ambulatory Visit: Payer: Self-pay | Admitting: Oncology

## 2019-11-06 ENCOUNTER — Other Ambulatory Visit: Payer: Self-pay | Admitting: Pharmacist

## 2019-11-06 DIAGNOSIS — C9000 Multiple myeloma not having achieved remission: Secondary | ICD-10-CM

## 2019-11-06 MED ORDER — LENALIDOMIDE 15 MG PO CAPS: ORAL_CAPSULE | ORAL | 0 refills | Status: DC

## 2019-11-06 NOTE — Progress Notes (Signed)
Oral Oncology Pharmacist Encounter  Prescription for Revlimid (lenalidomide) sent to [facility] in error. Medication is required to be filled through [place]. Prescription redirected to [facility].  Leron Croak, PharmD, BCPS Hematology/Oncology Clinical Pharmacist [facility] [phone] 11/06/2019 2:41 PM

## 2019-11-07 ENCOUNTER — Telehealth: Payer: Self-pay | Admitting: Pharmacist

## 2019-11-07 NOTE — Telephone Encounter (Signed)
Oral Oncology Pharmacist Encounter  Received new prescription for Revlimid (lenalidomide) for the treatment of multiple myeloma, s/p autoSCT 06/2019, in conjunction with bortezomib and dexamethasone, planned duration 2 cycles, followed by maintenance lenalidomide.  Prescription dose and frequency assessed for appropriateness. OK for therapy initiation. Plan for patient to start therapy 11/12/19  CBC w/ Diff and CMP from 09/17/19 assessed, Scr 1.41 mg/dL - at baseline for patient, no dose adjustments to Revlimid needed.  Current medication list in Epic reviewed, no significant/relevant DDIs with Revlimid identified.  Evaluated chart and no patient barriers to medication adherence noted.   Prescription [facility]scribed [facility] on 11/06/19.  Oral Oncology Clinic will continue to follow for initial counseling and start date.  Leron Croak, PharmD, BCPS Hematology/Oncology Clinical Pharmacist [facility] [phone] 11/07/2019 7:51 AM

## 2019-11-08 NOTE — Telephone Encounter (Signed)
Oral Chemotherapy Pharmacist Encounter  I spoke with patient for overview of: Revlimid for the treatment of multiple myeloma, s/p autoSCT 06/2019, in conjunction with Velcade and dexamethasone, planned duration 2 cycles.   Re-educated patient on administration, dosing, side effects, monitoring, drug-food interactions, safe handling, storage, and disposal.  Patient will take Revlimid 30m capsules, 1 capsule by mouth once daily, without regard to food, with a full glass of water.  Revlimid will be given 21 days on, 7 days off, repeat every 28 days.  Patient receive dexamethasone 449mtablets, 10 tablets (401mby mouth once weekly with Velcade injection in clinic.  Revlimid start date: 11/12/19  Adverse effects of Revlimid include but are not limited to: nausea, constipation, diarrhea, abdominal pain, rash, fatigue, drug fever, and decreased blood counts.    Reviewed with patient importance of keeping a medication schedule and plan for any missed doses. No barriers to medication adherence identified.  Medication reconciliation performed and medication/allergy list updated.  Patient stated he is still taking acyclovir. No need for aspirin 81 mg for VTE prophylaxis since patient is on apixaban 5 mg PO BID. Confirmed with patient he is still taking apixaban.  Insurance authorization for Revlimid has been obtained.  Revlimid [facility] as it is a limited distribution medication.  All questions answered.  Mr. MilPersichettiiced understanding and appreciation.   Patient knows to call the office with questions or concerns.  RebLeron CroakharmD, BCPS Hematology/Oncology Clinical Pharmacist [phone]/2021 3:09 PM

## 2019-11-11 ENCOUNTER — Other Ambulatory Visit: Payer: Self-pay | Admitting: Oncology

## 2019-11-12 ENCOUNTER — Inpatient Hospital Stay: Payer: BC Managed Care – PPO

## 2019-11-12 ENCOUNTER — Other Ambulatory Visit: Payer: Self-pay

## 2019-11-12 VITALS — BP 135/100 | HR 79 | Temp 98.4°F | Resp 18 | Wt 240.2 lb

## 2019-11-12 DIAGNOSIS — C9000 Multiple myeloma not having achieved remission: Secondary | ICD-10-CM | POA: Diagnosis not present

## 2019-11-12 LAB — CBC WITH DIFFERENTIAL (CANCER CENTER ONLY)
Abs Immature Granulocytes: 0.02 10*3/uL (ref 0.00–0.07)
Basophils Absolute: 0 10*3/uL (ref 0.0–0.1)
Basophils Relative: 1 %
Eosinophils Absolute: 0 10*3/uL (ref 0.0–0.5)
Eosinophils Relative: 1 %
HCT: 36.8 % — ABNORMAL LOW (ref 39.0–52.0)
Hemoglobin: 12.4 g/dL — ABNORMAL LOW (ref 13.0–17.0)
Immature Granulocytes: 1 %
Lymphocytes Relative: 29 %
Lymphs Abs: 1.1 10*3/uL (ref 0.7–4.0)
MCH: 27.5 pg (ref 26.0–34.0)
MCHC: 33.7 g/dL (ref 30.0–36.0)
MCV: 81.6 fL (ref 80.0–100.0)
Monocytes Absolute: 0.5 10*3/uL (ref 0.1–1.0)
Monocytes Relative: 12 %
Neutro Abs: 2.2 10*3/uL (ref 1.7–7.7)
Neutrophils Relative %: 56 %
Platelet Count: 218 10*3/uL (ref 150–400)
RBC: 4.51 MIL/uL (ref 4.22–5.81)
RDW: 13.6 % (ref 11.5–15.5)
WBC Count: 3.8 10*3/uL — ABNORMAL LOW (ref 4.0–10.5)
nRBC: 0 % (ref 0.0–0.2)

## 2019-11-12 LAB — CMP (CANCER CENTER ONLY)
ALT: 37 U/L (ref 0–44)
AST: 31 U/L (ref 15–41)
Albumin: 4.2 g/dL (ref 3.5–5.0)
Alkaline Phosphatase: 131 U/L — ABNORMAL HIGH (ref 38–126)
Anion gap: 11 (ref 5–15)
BUN: 15 mg/dL (ref 6–20)
CO2: 21 mmol/L — ABNORMAL LOW (ref 22–32)
Calcium: 10 mg/dL (ref 8.9–10.3)
Chloride: 108 mmol/L (ref 98–111)
Creatinine: 1.57 mg/dL — ABNORMAL HIGH (ref 0.61–1.24)
GFR, Est AFR Am: >60 mL/min (ref >60)
GFR, Estimated: 52 mL/min — ABNORMAL LOW (ref >60)
Glucose, Bld: 118 mg/dL — ABNORMAL HIGH (ref 70–99)
Potassium: 4 mmol/L (ref 3.5–5.1)
Sodium: 140 mmol/L (ref 135–145)
Total Bilirubin: 0.4 mg/dL (ref 0.3–1.2)
Total Protein: 6.9 g/dL (ref 6.5–8.1)

## 2019-11-12 MED ORDER — DEXAMETHASONE 4 MG PO TABS
40.0000 mg | ORAL_TABLET | Freq: Once | ORAL | Status: AC
  Administered 2019-11-12: 40 mg via ORAL

## 2019-11-12 MED ORDER — SODIUM CHLORIDE 0.9 % IV SOLN
Freq: Once | INTRAVENOUS | Status: AC
  Filled 2019-11-12: qty 250

## 2019-11-12 MED ORDER — ONDANSETRON HCL 8 MG PO TABS
4.0000 mg | ORAL_TABLET | Freq: Once | ORAL | Status: AC
  Administered 2019-11-12: 4 mg via ORAL

## 2019-11-12 MED ORDER — BORTEZOMIB CHEMO SQ INJECTION 3.5 MG (2.5MG/ML)
1.3000 mg/m2 | Freq: Once | INTRAMUSCULAR | Status: AC
  Administered 2019-11-12: 3 mg via SUBCUTANEOUS
  Filled 2019-11-12: qty 1.2

## 2019-11-12 MED ORDER — ONDANSETRON HCL 8 MG PO TABS
ORAL_TABLET | ORAL | Status: AC
  Filled 2019-11-12: qty 1

## 2019-11-12 MED ORDER — ZOLEDRONIC ACID 4 MG/100ML IV SOLN
INTRAVENOUS | Status: AC
  Filled 2019-11-12: qty 100

## 2019-11-12 MED ORDER — DEXAMETHASONE 4 MG PO TABS
ORAL_TABLET | ORAL | Status: AC
  Filled 2019-11-12: qty 10

## 2019-11-12 MED ORDER — ZOLEDRONIC ACID 4 MG/100ML IV SOLN
4.0000 mg | Freq: Once | INTRAVENOUS | Status: AC
  Administered 2019-11-12: 4 mg via INTRAVENOUS

## 2019-11-12 NOTE — Patient Instructions (Signed)
[facility] Discharge Instructions for Patients Receiving Chemotherapy  Today you received the following chemotherapy agents Bortezomib (VELCADE).  To help prevent nausea and vomiting after your treatment, we encourage you to take your nausea medication as prescribed.   If you develop nausea and vomiting that is not controlled by your nausea medication, call the clinic.   BELOW ARE SYMPTOMS THAT SHOULD BE REPORTED IMMEDIATELY:  *FEVER GREATER THAN 100.5 F  *CHILLS WITH OR WITHOUT FEVER  NAUSEA AND VOMITING THAT IS NOT CONTROLLED WITH YOUR NAUSEA MEDICATION  *UNUSUAL SHORTNESS OF BREATH  *UNUSUAL BRUISING OR BLEEDING  TENDERNESS IN MOUTH AND THROAT WITH OR WITHOUT PRESENCE OF ULCERS  *URINARY PROBLEMS  *BOWEL PROBLEMS  UNUSUAL RASH Items with * indicate a potential emergency and should be followed up as soon as possible.  Feel free to call the clinic should you have any questions or concerns. The clinic phone number is [phone].  Please show the [place] at check-in to the Emergency Department and triage nurse.  Zoledronic Acid injection (Hypercalcemia, Oncology) What is this medicine? ZOLEDRONIC ACID (ZOE le dron ik AS id) lowers the amount of calcium loss from bone. It is used to treat too much calcium in your blood from cancer. It is also used to prevent complications of cancer that has spread to the bone. This medicine may be used for other purposes; ask your health care provider or pharmacist if you have questions. COMMON BRAND NAME(S): Zometa What should I tell my health care provider before I take this medicine? They need to know if you have any of these conditions:  aspirin-sensitive asthma  cancer, especially if you are receiving medicines used to treat cancer  dental disease or wear dentures  infection  kidney disease  receiving corticosteroids like dexamethasone or prednisone  an unusual or allergic reaction to zoledronic  acid, other medicines, foods, dyes, or preservatives  pregnant or trying to get pregnant  breast-feeding How should I use this medicine? This medicine is for infusion into a vein. It is given by a health care professional in a hospital or clinic setting. Talk to your pediatrician regarding the use of this medicine in children. Special care may be needed. Overdosage: If you think you have taken too much of this medicine contact a poison control center or emergency room at once. NOTE: This medicine is only for you. Do not share this medicine with others. What if I miss a dose? It is important not to miss your dose. Call your doctor or health care professional if you are unable to keep an appointment. What may interact with this medicine?  certain antibiotics given by injection  NSAIDs, medicines for pain and inflammation, like ibuprofen or naproxen  some diuretics like bumetanide, furosemide  teriparatide  thalidomide This list may not describe all possible interactions. Give your health care provider a list of all the medicines, herbs, non-prescription drugs, or dietary supplements you use. Also tell them if you smoke, drink alcohol, or use illegal drugs. Some items may interact with your medicine. What should I watch for while using this medicine? Visit your doctor or health care professional for regular checkups. It may be some time before you see the benefit from this medicine. Do not stop taking your medicine unless your doctor tells you to. Your doctor may order blood tests or other tests to see how you are doing. Women should inform their doctor if they wish to become pregnant or think they might be  pregnant. There is a potential for serious side effects to an unborn child. Talk to your health care professional or pharmacist for more information. You should make sure that you get enough calcium and vitamin D while you are taking this medicine. Discuss the foods you eat and the  vitamins you take with your health care professional. Some people who take this medicine have severe bone, joint, and/or muscle pain. This medicine may also increase your risk for jaw problems or a broken thigh bone. Tell your doctor right away if you have severe pain in your jaw, bones, joints, or muscles. Tell your doctor if you have any pain that does not go away or that gets worse. Tell your dentist and dental surgeon that you are taking this medicine. You should not have major dental surgery while on this medicine. See your dentist to have a dental exam and fix any dental problems before starting this medicine. Take good care of your teeth while on this medicine. Make sure you see your dentist for regular follow-up appointments. What side effects may I notice from receiving this medicine? Side effects that you should report to your doctor or health care professional as soon as possible:  allergic reactions like skin rash, itching or hives, swelling of the face, lips, or tongue  anxiety, confusion, or depression  breathing problems  changes in vision  eye pain  feeling faint or lightheaded, falls  jaw pain, especially after dental work  mouth sores  muscle cramps, stiffness, or weakness  redness, blistering, peeling or loosening of the skin, including inside the mouth  trouble passing urine or change in the amount of urine Side effects that usually do not require medical attention (report to your doctor or health care professional if they continue or are bothersome):  bone, joint, or muscle pain  constipation  diarrhea  fever  hair loss  irritation at site where injected  loss of appetite  nausea, vomiting  stomach upset  trouble sleeping  trouble swallowing  weak or tired This list may not describe all possible side effects. Call your doctor for medical advice about side effects. You may report side effects to FDA at 1-800-FDA-1088. Where should I keep my  medicine? This drug is given in a hospital or clinic and will not be stored at home. NOTE: This sheet is a summary. It may not cover all possible information. If you have questions about this medicine, talk to your doctor, pharmacist, or health care provider.  2020 Elsevier/Gold Standard (2013-08-04 14:19:39)

## 2019-11-12 NOTE — Progress Notes (Signed)
Per Dr. Benay Spice: OK to treat w/creatinine 1.57

## 2019-11-13 LAB — PROTEIN ELECTROPHORESIS, SERUM
A/G Ratio: 1.3 (ref 0.7–1.7)
Albumin ELP: 3.7 g/dL (ref 2.9–4.4)
Alpha-1-Globulin: 0.2 g/dL (ref 0.0–0.4)
Alpha-2-Globulin: 0.9 g/dL (ref 0.4–1.0)
Beta Globulin: 1.2 g/dL (ref 0.7–1.3)
Gamma Globulin: 0.6 g/dL (ref 0.4–1.8)
Globulin, Total: 2.9 g/dL (ref 2.2–3.9)
Total Protein ELP: 6.6 g/dL (ref 6.0–8.5)

## 2019-11-13 LAB — KAPPA/LAMBDA LIGHT CHAINS
Kappa free light chain: 51.3 mg/L — ABNORMAL HIGH (ref 3.3–19.4)
Kappa, lambda light chain ratio: 15.55 — ABNORMAL HIGH (ref 0.26–1.65)
Lambda free light chains: 3.3 mg/L — ABNORMAL LOW (ref 5.7–26.3)

## 2019-11-13 LAB — IGG, IGA, IGM
IgA: 24 mg/dL — ABNORMAL LOW (ref 90–386)
IgG (Immunoglobin G), Serum: 577 mg/dL — ABNORMAL LOW (ref 603–1613)
IgM (Immunoglobulin M), Srm: 21 mg/dL (ref 20–172)

## 2019-11-18 ENCOUNTER — Other Ambulatory Visit: Payer: Self-pay | Admitting: Oncology

## 2019-11-19 ENCOUNTER — Inpatient Hospital Stay: Payer: BC Managed Care – PPO

## 2019-11-19 ENCOUNTER — Other Ambulatory Visit: Payer: Self-pay

## 2019-11-19 ENCOUNTER — Ambulatory Visit: Payer: BC Managed Care – PPO

## 2019-11-19 VITALS — BP 127/86 | HR 75 | Temp 98.4°F | Resp 18

## 2019-11-19 DIAGNOSIS — C9000 Multiple myeloma not having achieved remission: Secondary | ICD-10-CM | POA: Diagnosis not present

## 2019-11-19 LAB — CBC WITH DIFFERENTIAL (CANCER CENTER ONLY)
Abs Immature Granulocytes: 0.03 10*3/uL (ref 0.00–0.07)
Basophils Absolute: 0 10*3/uL (ref 0.0–0.1)
Basophils Relative: 1 %
Eosinophils Absolute: 0.1 10*3/uL (ref 0.0–0.5)
Eosinophils Relative: 2 %
HCT: 36.6 % — ABNORMAL LOW (ref 39.0–52.0)
Hemoglobin: 12.4 g/dL — ABNORMAL LOW (ref 13.0–17.0)
Immature Granulocytes: 1 %
Lymphocytes Relative: 27 %
Lymphs Abs: 1 10*3/uL (ref 0.7–4.0)
MCH: 27.9 pg (ref 26.0–34.0)
MCHC: 33.9 g/dL (ref 30.0–36.0)
MCV: 82.2 fL (ref 80.0–100.0)
Monocytes Absolute: 0.4 10*3/uL (ref 0.1–1.0)
Monocytes Relative: 10 %
Neutro Abs: 2.3 10*3/uL (ref 1.7–7.7)
Neutrophils Relative %: 59 %
Platelet Count: 229 10*3/uL (ref 150–400)
RBC: 4.45 MIL/uL (ref 4.22–5.81)
RDW: 13.6 % (ref 11.5–15.5)
WBC Count: 3.9 10*3/uL — ABNORMAL LOW (ref 4.0–10.5)
nRBC: 0 % (ref 0.0–0.2)

## 2019-11-19 LAB — CMP (CANCER CENTER ONLY)
ALT: 125 U/L — ABNORMAL HIGH (ref 0–44)
AST: 74 U/L — ABNORMAL HIGH (ref 15–41)
Albumin: 3.9 g/dL (ref 3.5–5.0)
Alkaline Phosphatase: 120 U/L (ref 38–126)
Anion gap: 9 (ref 5–15)
BUN: 10 mg/dL (ref 6–20)
CO2: 24 mmol/L (ref 22–32)
Calcium: 10 mg/dL (ref 8.9–10.3)
Chloride: 107 mmol/L (ref 98–111)
Creatinine: 1.18 mg/dL (ref 0.61–1.24)
GFR, Est AFR Am: >60 mL/min (ref >60)
GFR, Estimated: >60 mL/min (ref >60)
Glucose, Bld: 112 mg/dL — ABNORMAL HIGH (ref 70–99)
Potassium: 3.5 mmol/L (ref 3.5–5.1)
Sodium: 140 mmol/L (ref 135–145)
Total Bilirubin: 0.4 mg/dL (ref 0.3–1.2)
Total Protein: 6.7 g/dL (ref 6.5–8.1)

## 2019-11-19 MED ORDER — ONDANSETRON HCL 8 MG PO TABS
ORAL_TABLET | ORAL | Status: AC
  Filled 2019-11-19: qty 1

## 2019-11-19 MED ORDER — DEXAMETHASONE 4 MG PO TABS
ORAL_TABLET | ORAL | Status: AC
  Filled 2019-11-19: qty 10

## 2019-11-19 MED ORDER — BORTEZOMIB CHEMO SQ INJECTION 3.5 MG (2.5MG/ML)
1.3000 mg/m2 | Freq: Once | INTRAMUSCULAR | Status: AC
  Administered 2019-11-19: 3 mg via SUBCUTANEOUS
  Filled 2019-11-19: qty 1.2

## 2019-11-19 MED ORDER — DEXAMETHASONE 4 MG PO TABS
40.0000 mg | ORAL_TABLET | Freq: Once | ORAL | Status: AC
  Administered 2019-11-19: 40 mg via ORAL

## 2019-11-19 MED ORDER — ONDANSETRON HCL 8 MG PO TABS
4.0000 mg | ORAL_TABLET | Freq: Once | ORAL | Status: AC
  Administered 2019-11-19: 4 mg via ORAL

## 2019-11-19 NOTE — Patient Instructions (Signed)
[facility] Discharge Instructions for Patients Receiving Chemotherapy  Today you received the following chemotherapy agent: Bortezomib (VELCADE).  To help prevent nausea and vomiting after your treatment, we encourage you to take your nausea medication as prescribed.   If you develop nausea and vomiting that is not controlled by your nausea medication, call the clinic.   BELOW ARE SYMPTOMS THAT SHOULD BE REPORTED IMMEDIATELY:  *FEVER GREATER THAN 100.5 F  *CHILLS WITH OR WITHOUT FEVER  NAUSEA AND VOMITING THAT IS NOT CONTROLLED WITH YOUR NAUSEA MEDICATION  *UNUSUAL SHORTNESS OF BREATH  *UNUSUAL BRUISING OR BLEEDING  TENDERNESS IN MOUTH AND THROAT WITH OR WITHOUT PRESENCE OF ULCERS  *URINARY PROBLEMS  *BOWEL PROBLEMS  UNUSUAL RASH Items with * indicate a potential emergency and should be followed up as soon as possible.  Feel free to call the clinic should you have any questions or concerns. The clinic phone number is [phone].  Please show the CHEMO ALERT CARD at check-in to the Emergency Department and triage nurse.   

## 2019-11-19 NOTE — Progress Notes (Signed)
Okay to proceed with treatment with ALT of 125 per Dr. Benay Spice

## 2019-11-26 ENCOUNTER — Other Ambulatory Visit: Payer: Self-pay | Admitting: Oncology

## 2019-11-27 ENCOUNTER — Other Ambulatory Visit: Payer: BC Managed Care – PPO

## 2019-11-27 ENCOUNTER — Inpatient Hospital Stay: Payer: BC Managed Care – PPO

## 2019-11-27 ENCOUNTER — Inpatient Hospital Stay: Payer: BC Managed Care – PPO | Attending: Oncology | Admitting: Oncology

## 2019-11-27 ENCOUNTER — Ambulatory Visit: Payer: BC Managed Care – PPO

## 2019-11-27 ENCOUNTER — Other Ambulatory Visit: Payer: Self-pay

## 2019-11-27 VITALS — BP 140/88 | HR 84 | Temp 97.6°F | Resp 20 | Ht 73.0 in | Wt 238.0 lb

## 2019-11-27 DIAGNOSIS — Z5112 Encounter for antineoplastic immunotherapy: Secondary | ICD-10-CM | POA: Insufficient documentation

## 2019-11-27 DIAGNOSIS — C9000 Multiple myeloma not having achieved remission: Secondary | ICD-10-CM

## 2019-11-27 DIAGNOSIS — Z79899 Other long term (current) drug therapy: Secondary | ICD-10-CM | POA: Diagnosis not present

## 2019-11-27 DIAGNOSIS — Z86718 Personal history of other venous thrombosis and embolism: Secondary | ICD-10-CM | POA: Insufficient documentation

## 2019-11-27 DIAGNOSIS — D649 Anemia, unspecified: Secondary | ICD-10-CM | POA: Insufficient documentation

## 2019-11-27 LAB — CBC WITH DIFFERENTIAL (CANCER CENTER ONLY)
Abs Immature Granulocytes: 0.03 10*3/uL (ref 0.00–0.07)
Basophils Absolute: 0 10*3/uL (ref 0.0–0.1)
Basophils Relative: 0 %
Eosinophils Absolute: 0.1 10*3/uL (ref 0.0–0.5)
Eosinophils Relative: 1 %
HCT: 39 % (ref 39.0–52.0)
Hemoglobin: 13.2 g/dL (ref 13.0–17.0)
Immature Granulocytes: 1 %
Lymphocytes Relative: 19 %
Lymphs Abs: 0.9 10*3/uL (ref 0.7–4.0)
MCH: 28 pg (ref 26.0–34.0)
MCHC: 33.8 g/dL (ref 30.0–36.0)
MCV: 82.6 fL (ref 80.0–100.0)
Monocytes Absolute: 0.5 10*3/uL (ref 0.1–1.0)
Monocytes Relative: 11 %
Neutro Abs: 3.1 10*3/uL (ref 1.7–7.7)
Neutrophils Relative %: 68 %
Platelet Count: 201 10*3/uL (ref 150–400)
RBC: 4.72 MIL/uL (ref 4.22–5.81)
RDW: 13.6 % (ref 11.5–15.5)
WBC Count: 4.5 10*3/uL (ref 4.0–10.5)
nRBC: 0 % (ref 0.0–0.2)

## 2019-11-27 LAB — CMP (CANCER CENTER ONLY)
ALT: 218 U/L — ABNORMAL HIGH (ref 0–44)
AST: 134 U/L — ABNORMAL HIGH (ref 15–41)
Albumin: 4 g/dL (ref 3.5–5.0)
Alkaline Phosphatase: 126 U/L (ref 38–126)
Anion gap: 8 (ref 5–15)
BUN: 9 mg/dL (ref 6–20)
CO2: 22 mmol/L (ref 22–32)
Calcium: 9 mg/dL (ref 8.9–10.3)
Chloride: 108 mmol/L (ref 98–111)
Creatinine: 1.43 mg/dL — ABNORMAL HIGH (ref 0.61–1.24)
GFR, Est AFR Am: >60 mL/min (ref >60)
GFR, Estimated: 59 mL/min — ABNORMAL LOW (ref >60)
Glucose, Bld: 108 mg/dL — ABNORMAL HIGH (ref 70–99)
Potassium: 4 mmol/L (ref 3.5–5.1)
Sodium: 138 mmol/L (ref 135–145)
Total Bilirubin: 0.5 mg/dL (ref 0.3–1.2)
Total Protein: 7 g/dL (ref 6.5–8.1)

## 2019-11-27 MED ORDER — ONDANSETRON HCL 8 MG PO TABS
4.0000 mg | ORAL_TABLET | Freq: Once | ORAL | Status: AC
  Administered 2019-11-27: 4 mg via ORAL

## 2019-11-27 MED ORDER — BORTEZOMIB CHEMO SQ INJECTION 3.5 MG (2.5MG/ML)
1.3000 mg/m2 | Freq: Once | INTRAMUSCULAR | Status: AC
  Administered 2019-11-27: 3 mg via SUBCUTANEOUS
  Filled 2019-11-27: qty 1.2

## 2019-11-27 MED ORDER — DEXAMETHASONE 4 MG PO TABS
ORAL_TABLET | ORAL | Status: AC
  Filled 2019-11-27: qty 10

## 2019-11-27 MED ORDER — DEXAMETHASONE 4 MG PO TABS
40.0000 mg | ORAL_TABLET | Freq: Once | ORAL | Status: AC
  Administered 2019-11-27: 40 mg via ORAL

## 2019-11-27 MED ORDER — ONDANSETRON HCL 8 MG PO TABS
ORAL_TABLET | ORAL | Status: AC
  Filled 2019-11-27: qty 1

## 2019-11-27 MED ORDER — DEXAMETHASONE 4 MG PO TABS
ORAL_TABLET | ORAL | Status: AC
  Filled 2019-11-27: qty 1

## 2019-11-27 NOTE — Patient Instructions (Signed)
[facility] Discharge Instructions for Patients Receiving Chemotherapy  Today you received the following chemotherapy agent: Velcade  To help prevent nausea and vomiting after your treatment, we encourage you to take your nausea medication as directed by your MD.   If you develop nausea and vomiting that is not controlled by your nausea medication, call the clinic.   BELOW ARE SYMPTOMS THAT SHOULD BE REPORTED IMMEDIATELY:  *FEVER GREATER THAN 100.5 F  *CHILLS WITH OR WITHOUT FEVER  NAUSEA AND VOMITING THAT IS NOT CONTROLLED WITH YOUR NAUSEA MEDICATION  *UNUSUAL SHORTNESS OF BREATH  *UNUSUAL BRUISING OR BLEEDING  TENDERNESS IN MOUTH AND THROAT WITH OR WITHOUT PRESENCE OF ULCERS  *URINARY PROBLEMS  *BOWEL PROBLEMS  UNUSUAL RASH Items with * indicate a potential emergency and should be followed up as soon as possible.  Feel free to call the clinic should you have any questions or concerns. The clinic phone number is [phone].  Please show the CHEMO ALERT CARD at check-in to the Emergency Department and triage nurse.   

## 2019-11-27 NOTE — Progress Notes (Signed)
OK to treat with elevated AST/ALT per Dr. Benay Spice.
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
## 2019-11-28 ENCOUNTER — Telehealth: Payer: Self-pay | Admitting: Oncology

## 2019-11-28 NOTE — Telephone Encounter (Signed)
Scheduled appointments per 9/7 los. Patient is aware of appointments.

## 2019-11-29 ENCOUNTER — Other Ambulatory Visit: Payer: Self-pay | Admitting: *Deleted

## 2019-11-29 DIAGNOSIS — C9000 Multiple myeloma not having achieved remission: Secondary | ICD-10-CM

## 2019-11-29 MED ORDER — LENALIDOMIDE 15 MG PO CAPS: ORAL_CAPSULE | ORAL | 0 refills | Status: DC

## 2019-12-09 ENCOUNTER — Other Ambulatory Visit: Payer: Self-pay | Admitting: Oncology

## 2019-12-10 ENCOUNTER — Other Ambulatory Visit: Payer: Self-pay

## 2019-12-10 ENCOUNTER — Encounter: Payer: Self-pay | Admitting: Nurse Practitioner

## 2019-12-10 ENCOUNTER — Inpatient Hospital Stay (HOSPITAL_BASED_OUTPATIENT_CLINIC_OR_DEPARTMENT_OTHER): Payer: BC Managed Care – PPO | Admitting: Nurse Practitioner

## 2019-12-10 ENCOUNTER — Inpatient Hospital Stay: Payer: BC Managed Care – PPO

## 2019-12-10 VITALS — BP 127/86 | HR 83 | Temp 98.1°F | Resp 17 | Ht 73.0 in | Wt 239.4 lb

## 2019-12-10 DIAGNOSIS — C9000 Multiple myeloma not having achieved remission: Secondary | ICD-10-CM

## 2019-12-10 LAB — CBC WITH DIFFERENTIAL (CANCER CENTER ONLY)
Abs Immature Granulocytes: 0 10*3/uL (ref 0.00–0.07)
Basophils Absolute: 0.1 10*3/uL (ref 0.0–0.1)
Basophils Relative: 1 %
Eosinophils Absolute: 0 10*3/uL (ref 0.0–0.5)
Eosinophils Relative: 1 %
HCT: 38.8 % — ABNORMAL LOW (ref 39.0–52.0)
Hemoglobin: 12.8 g/dL — ABNORMAL LOW (ref 13.0–17.0)
Immature Granulocytes: 0 %
Lymphocytes Relative: 24 %
Lymphs Abs: 1 10*3/uL (ref 0.7–4.0)
MCH: 27.2 pg (ref 26.0–34.0)
MCHC: 33 g/dL (ref 30.0–36.0)
MCV: 82.4 fL (ref 80.0–100.0)
Monocytes Absolute: 0.6 10*3/uL (ref 0.1–1.0)
Monocytes Relative: 15 %
Neutro Abs: 2.4 10*3/uL (ref 1.7–7.7)
Neutrophils Relative %: 59 %
Platelet Count: 240 10*3/uL (ref 150–400)
RBC: 4.71 MIL/uL (ref 4.22–5.81)
RDW: 13.7 % (ref 11.5–15.5)
WBC Count: 4.2 10*3/uL (ref 4.0–10.5)
nRBC: 0 % (ref 0.0–0.2)

## 2019-12-10 LAB — CMP (CANCER CENTER ONLY)
ALT: 76 U/L — ABNORMAL HIGH (ref 0–44)
AST: 35 U/L (ref 15–41)
Albumin: 3.9 g/dL (ref 3.5–5.0)
Alkaline Phosphatase: 114 U/L (ref 38–126)
Anion gap: 8 (ref 5–15)
BUN: 10 mg/dL (ref 6–20)
CO2: 23 mmol/L (ref 22–32)
Calcium: 9.2 mg/dL (ref 8.9–10.3)
Chloride: 108 mmol/L (ref 98–111)
Creatinine: 1.28 mg/dL — ABNORMAL HIGH (ref 0.61–1.24)
GFR, Est AFR Am: >60 mL/min (ref >60)
GFR, Estimated: >60 mL/min (ref >60)
Glucose, Bld: 97 mg/dL (ref 70–99)
Potassium: 4.1 mmol/L (ref 3.5–5.1)
Sodium: 139 mmol/L (ref 135–145)
Total Bilirubin: 0.4 mg/dL (ref 0.3–1.2)
Total Protein: 6.9 g/dL (ref 6.5–8.1)

## 2019-12-10 MED ORDER — ONDANSETRON HCL 8 MG PO TABS
ORAL_TABLET | ORAL | Status: AC
  Filled 2019-12-10: qty 1

## 2019-12-10 MED ORDER — ZOLEDRONIC ACID 4 MG/100ML IV SOLN
INTRAVENOUS | Status: AC
  Filled 2019-12-10: qty 100

## 2019-12-10 MED ORDER — ZOLEDRONIC ACID 4 MG/100ML IV SOLN
4.0000 mg | Freq: Once | INTRAVENOUS | Status: AC
  Administered 2019-12-10: 4 mg via INTRAVENOUS

## 2019-12-10 MED ORDER — ONDANSETRON HCL 8 MG PO TABS
4.0000 mg | ORAL_TABLET | Freq: Once | ORAL | Status: AC
  Administered 2019-12-10: 4 mg via ORAL

## 2019-12-10 MED ORDER — DEXAMETHASONE 4 MG PO TABS
40.0000 mg | ORAL_TABLET | Freq: Once | ORAL | Status: AC
  Administered 2019-12-10: 40 mg via ORAL

## 2019-12-10 MED ORDER — BORTEZOMIB CHEMO SQ INJECTION 3.5 MG (2.5MG/ML)
1.3000 mg/m2 | Freq: Once | INTRAMUSCULAR | Status: AC
  Administered 2019-12-10: 3 mg via SUBCUTANEOUS
  Filled 2019-12-10: qty 1.2

## 2019-12-10 MED ORDER — DEXAMETHASONE 4 MG PO TABS
ORAL_TABLET | ORAL | Status: AC
  Filled 2019-12-10: qty 10

## 2019-12-10 MED ORDER — SODIUM CHLORIDE 0.9 % IV SOLN
Freq: Once | INTRAVENOUS | Status: AC
  Filled 2019-12-10: qty 250

## 2019-12-10 NOTE — Patient Instructions (Signed)
[facility] Discharge Instructions for Patients Receiving Chemotherapy  Today you received the following chemotherapy agents: Velcade  To help prevent nausea and vomiting after your treatment, we encourage you to take your nausea medication  as prescribed.    If you develop nausea and vomiting that is not controlled by your nausea medication, call the clinic.   BELOW ARE SYMPTOMS THAT SHOULD BE REPORTED IMMEDIATELY:  *FEVER GREATER THAN 100.5 F  *CHILLS WITH OR WITHOUT FEVER  NAUSEA AND VOMITING THAT IS NOT CONTROLLED WITH YOUR NAUSEA MEDICATION  *UNUSUAL SHORTNESS OF BREATH  *UNUSUAL BRUISING OR BLEEDING  TENDERNESS IN MOUTH AND THROAT WITH OR WITHOUT PRESENCE OF ULCERS  *URINARY PROBLEMS  *BOWEL PROBLEMS  UNUSUAL RASH Items with * indicate a potential emergency and should be followed up as soon as possible.  Feel free to call the clinic should you have any questions or concerns. The clinic phone number is [phone].  Please show the CHEMO ALERT CARD at check-in to the Emergency Department and triage nurse.  Zoledronic Acid injection (Hypercalcemia, Oncology) What is this medicine? ZOLEDRONIC ACID (ZOE le dron ik AS id) lowers the amount of calcium loss from bone. It is used to treat too much calcium in your blood from cancer. It is also used to prevent complications of cancer that has spread to the bone. This medicine may be used for other purposes; ask your health care provider or pharmacist if you have questions. COMMON BRAND NAME(S): Zometa What should I tell my health care provider before I take this medicine? They need to know if you have any of these conditions:  aspirin-sensitive asthma  cancer, especially if you are receiving medicines used to treat cancer  dental disease or wear dentures  infection  kidney disease  receiving corticosteroids like dexamethasone or prednisone  an unusual or allergic reaction to zoledronic acid, other  medicines, foods, dyes, or preservatives  pregnant or trying to get pregnant  breast-feeding How should I use this medicine? This medicine is for infusion into a vein. It is given by a health care professional in a hospital or clinic setting. Talk to your pediatrician regarding the use of this medicine in children. Special care may be needed. Overdosage: If you think you have taken too much of this medicine contact a poison control center or emergency room at once. NOTE: This medicine is only for you. Do not share this medicine with others. What if I miss a dose? It is important not to miss your dose. Call your doctor or health care professional if you are unable to keep an appointment. What may interact with this medicine?  certain antibiotics given by injection  NSAIDs, medicines for pain and inflammation, like ibuprofen or naproxen  some diuretics like bumetanide, furosemide  teriparatide  thalidomide This list may not describe all possible interactions. Give your health care provider a list of all the medicines, herbs, non-prescription drugs, or dietary supplements you use. Also tell them if you smoke, drink alcohol, or use illegal drugs. Some items may interact with your medicine. What should I watch for while using this medicine? Visit your doctor or health care professional for regular checkups. It may be some time before you see the benefit from this medicine. Do not stop taking your medicine unless your doctor tells you to. Your doctor may order blood tests or other tests to see how you are doing. Women should inform their doctor if they wish to become pregnant or think they might   be pregnant. There is a potential for serious side effects to an unborn child. Talk to your health care professional or pharmacist for more information. You should make sure that you get enough calcium and vitamin D while you are taking this medicine. Discuss the foods you eat and the vitamins you take  with your health care professional. Some people who take this medicine have severe bone, joint, and/or muscle pain. This medicine may also increase your risk for jaw problems or a broken thigh bone. Tell your doctor right away if you have severe pain in your jaw, bones, joints, or muscles. Tell your doctor if you have any pain that does not go away or that gets worse. Tell your dentist and dental surgeon that you are taking this medicine. You should not have major dental surgery while on this medicine. See your dentist to have a dental exam and fix any dental problems before starting this medicine. Take good care of your teeth while on this medicine. Make sure you see your dentist for regular follow-up appointments. What side effects may I notice from receiving this medicine? Side effects that you should report to your doctor or health care professional as soon as possible:  allergic reactions like skin rash, itching or hives, swelling of the face, lips, or tongue  anxiety, confusion, or depression  breathing problems  changes in vision  eye pain  feeling faint or lightheaded, falls  jaw pain, especially after dental work  mouth sores  muscle cramps, stiffness, or weakness  redness, blistering, peeling or loosening of the skin, including inside the mouth  trouble passing urine or change in the amount of urine Side effects that usually do not require medical attention (report to your doctor or health care professional if they continue or are bothersome):  bone, joint, or muscle pain  constipation  diarrhea  fever  hair loss  irritation at site where injected  loss of appetite  nausea, vomiting  stomach upset  trouble sleeping  trouble swallowing  weak or tired This list may not describe all possible side effects. Call your doctor for medical advice about side effects. You may report side effects to FDA at 1-800-FDA-1088. Where should I keep my medicine? This drug  is given in a hospital or clinic and will not be stored at home. NOTE: This sheet is a summary. It may not cover all possible information. If you have questions about this medicine, talk to your doctor, pharmacist, or health care provider.  2020 Elsevier/Gold Standard (2013-08-04 14:19:39)    
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
## 2019-12-11 ENCOUNTER — Telehealth: Payer: Self-pay | Admitting: Nurse Practitioner

## 2019-12-11 LAB — KAPPA/LAMBDA LIGHT CHAINS
Kappa free light chain: 26 mg/L — ABNORMAL HIGH (ref 3.3–19.4)
Kappa, lambda light chain ratio: 8.67 — ABNORMAL HIGH (ref 0.26–1.65)
Lambda free light chains: 3 mg/L — ABNORMAL LOW (ref 5.7–26.3)

## 2019-12-11 NOTE — Telephone Encounter (Signed)
Scheduled appointment per 9/20 los. Spoke with patient who is aware of appointments. Patient also requested for Covid Vaccine to not be scheduled on day of treatment, but to be scheduled on day of only follow up visit after treatment is finished. Once scheduling notes have been sent to schedule that appointment, will schedule vaccine along with it.

## 2019-12-14 ENCOUNTER — Other Ambulatory Visit: Payer: Self-pay

## 2019-12-14 DIAGNOSIS — C9000 Multiple myeloma not having achieved remission: Secondary | ICD-10-CM

## 2019-12-17 ENCOUNTER — Other Ambulatory Visit: Payer: Self-pay

## 2019-12-17 ENCOUNTER — Other Ambulatory Visit: Payer: Self-pay | Admitting: Oncology

## 2019-12-17 ENCOUNTER — Inpatient Hospital Stay: Payer: BC Managed Care – PPO

## 2019-12-17 VITALS — BP 122/82 | HR 74 | Temp 98.4°F | Resp 18 | Wt 240.8 lb

## 2019-12-17 DIAGNOSIS — C9000 Multiple myeloma not having achieved remission: Secondary | ICD-10-CM

## 2019-12-17 LAB — CBC WITH DIFFERENTIAL (CANCER CENTER ONLY)
Abs Immature Granulocytes: 0.05 10*3/uL (ref 0.00–0.07)
Basophils Absolute: 0 10*3/uL (ref 0.0–0.1)
Basophils Relative: 1 %
Eosinophils Absolute: 0.1 10*3/uL (ref 0.0–0.5)
Eosinophils Relative: 3 %
HCT: 39.1 % (ref 39.0–52.0)
Hemoglobin: 13.1 g/dL (ref 13.0–17.0)
Immature Granulocytes: 2 %
Lymphocytes Relative: 17 %
Lymphs Abs: 0.6 10*3/uL — ABNORMAL LOW (ref 0.7–4.0)
MCH: 27.8 pg (ref 26.0–34.0)
MCHC: 33.5 g/dL (ref 30.0–36.0)
MCV: 83 fL (ref 80.0–100.0)
Monocytes Absolute: 0.3 10*3/uL (ref 0.1–1.0)
Monocytes Relative: 10 %
Neutro Abs: 2.3 10*3/uL (ref 1.7–7.7)
Neutrophils Relative %: 67 %
Platelet Count: 199 10*3/uL (ref 150–400)
RBC: 4.71 MIL/uL (ref 4.22–5.81)
RDW: 14.1 % (ref 11.5–15.5)
WBC Count: 3.4 10*3/uL — ABNORMAL LOW (ref 4.0–10.5)
nRBC: 0 % (ref 0.0–0.2)

## 2019-12-17 LAB — CMP (CANCER CENTER ONLY)
ALT: 70 U/L — ABNORMAL HIGH (ref 0–44)
AST: 33 U/L (ref 15–41)
Albumin: 3.7 g/dL (ref 3.5–5.0)
Alkaline Phosphatase: 95 U/L (ref 38–126)
Anion gap: 7 (ref 5–15)
BUN: 8 mg/dL (ref 6–20)
CO2: 25 mmol/L (ref 22–32)
Calcium: 8.6 mg/dL — ABNORMAL LOW (ref 8.9–10.3)
Chloride: 108 mmol/L (ref 98–111)
Creatinine: 1.32 mg/dL — ABNORMAL HIGH (ref 0.61–1.24)
GFR, Est AFR Am: >60 mL/min (ref >60)
GFR, Estimated: >60 mL/min (ref >60)
Glucose, Bld: 123 mg/dL — ABNORMAL HIGH (ref 70–99)
Potassium: 3.9 mmol/L (ref 3.5–5.1)
Sodium: 140 mmol/L (ref 135–145)
Total Bilirubin: 0.4 mg/dL (ref 0.3–1.2)
Total Protein: 6.7 g/dL (ref 6.5–8.1)

## 2019-12-17 MED ORDER — DEXAMETHASONE 4 MG PO TABS
40.0000 mg | ORAL_TABLET | Freq: Once | ORAL | Status: AC
  Administered 2019-12-17: 40 mg via ORAL

## 2019-12-17 MED ORDER — BORTEZOMIB CHEMO SQ INJECTION 3.5 MG (2.5MG/ML)
1.3000 mg/m2 | Freq: Once | INTRAMUSCULAR | Status: AC
  Administered 2019-12-17: 3 mg via SUBCUTANEOUS
  Filled 2019-12-17: qty 1.2

## 2019-12-17 MED ORDER — DEXAMETHASONE 4 MG PO TABS
ORAL_TABLET | ORAL | Status: AC
  Filled 2019-12-17: qty 10

## 2019-12-17 MED ORDER — ONDANSETRON HCL 8 MG PO TABS
ORAL_TABLET | ORAL | Status: AC
  Filled 2019-12-17: qty 1

## 2019-12-17 MED ORDER — ONDANSETRON HCL 8 MG PO TABS
4.0000 mg | ORAL_TABLET | Freq: Once | ORAL | Status: AC
  Administered 2019-12-17: 4 mg via ORAL

## 2019-12-17 NOTE — Patient Instructions (Signed)
[facility] Discharge Instructions for Patients Receiving Chemotherapy  Today you received the following chemotherapy agents: bortezomib.  To help prevent nausea and vomiting after your treatment, we encourage you to take your nausea medication as directed.   If you develop nausea and vomiting that is not controlled by your nausea medication, call the clinic.   BELOW ARE SYMPTOMS THAT SHOULD BE REPORTED IMMEDIATELY:  *FEVER GREATER THAN 100.5 F  *CHILLS WITH OR WITHOUT FEVER  NAUSEA AND VOMITING THAT IS NOT CONTROLLED WITH YOUR NAUSEA MEDICATION  *UNUSUAL SHORTNESS OF BREATH  *UNUSUAL BRUISING OR BLEEDING  TENDERNESS IN MOUTH AND THROAT WITH OR WITHOUT PRESENCE OF ULCERS  *URINARY PROBLEMS  *BOWEL PROBLEMS  UNUSUAL RASH Items with * indicate a potential emergency and should be followed up as soon as possible.  Feel free to call the clinic should you have any questions or concerns. The clinic phone number is [phone].  Please show the CHEMO ALERT CARD at check-in to the Emergency Department and triage nurse.   

## 2019-12-23 ENCOUNTER — Other Ambulatory Visit: Payer: Self-pay | Admitting: Oncology

## 2019-12-24 ENCOUNTER — Inpatient Hospital Stay: Payer: BC Managed Care – PPO

## 2019-12-24 ENCOUNTER — Other Ambulatory Visit: Payer: Self-pay | Admitting: *Deleted

## 2019-12-24 ENCOUNTER — Inpatient Hospital Stay: Payer: BC Managed Care – PPO | Attending: Oncology

## 2019-12-24 ENCOUNTER — Other Ambulatory Visit: Payer: Self-pay

## 2019-12-24 VITALS — BP 122/87 | HR 64 | Temp 98.6°F | Resp 18

## 2019-12-24 DIAGNOSIS — Z5112 Encounter for antineoplastic immunotherapy: Secondary | ICD-10-CM | POA: Insufficient documentation

## 2019-12-24 DIAGNOSIS — I10 Essential (primary) hypertension: Secondary | ICD-10-CM | POA: Diagnosis not present

## 2019-12-24 DIAGNOSIS — C9000 Multiple myeloma not having achieved remission: Secondary | ICD-10-CM | POA: Insufficient documentation

## 2019-12-24 DIAGNOSIS — Z86718 Personal history of other venous thrombosis and embolism: Secondary | ICD-10-CM | POA: Insufficient documentation

## 2019-12-24 DIAGNOSIS — D649 Anemia, unspecified: Secondary | ICD-10-CM | POA: Insufficient documentation

## 2019-12-24 LAB — CBC WITH DIFFERENTIAL (CANCER CENTER ONLY)
Abs Immature Granulocytes: 0.05 10*3/uL (ref 0.00–0.07)
Basophils Absolute: 0 10*3/uL (ref 0.0–0.1)
Basophils Relative: 1 %
Eosinophils Absolute: 0.1 10*3/uL (ref 0.0–0.5)
Eosinophils Relative: 3 %
HCT: 36.8 % — ABNORMAL LOW (ref 39.0–52.0)
Hemoglobin: 12.4 g/dL — ABNORMAL LOW (ref 13.0–17.0)
Immature Granulocytes: 1 %
Lymphocytes Relative: 15 %
Lymphs Abs: 0.6 10*3/uL — ABNORMAL LOW (ref 0.7–4.0)
MCH: 27.6 pg (ref 26.0–34.0)
MCHC: 33.7 g/dL (ref 30.0–36.0)
MCV: 82 fL (ref 80.0–100.0)
Monocytes Absolute: 0.6 10*3/uL (ref 0.1–1.0)
Monocytes Relative: 14 %
Neutro Abs: 2.6 10*3/uL (ref 1.7–7.7)
Neutrophils Relative %: 66 %
Platelet Count: 190 10*3/uL (ref 150–400)
RBC: 4.49 MIL/uL (ref 4.22–5.81)
RDW: 14.1 % (ref 11.5–15.5)
WBC Count: 3.9 10*3/uL — ABNORMAL LOW (ref 4.0–10.5)
nRBC: 0 % (ref 0.0–0.2)

## 2019-12-24 MED ORDER — ONDANSETRON HCL 8 MG PO TABS
4.0000 mg | ORAL_TABLET | Freq: Once | ORAL | Status: AC
  Administered 2019-12-24: 4 mg via ORAL

## 2019-12-24 MED ORDER — ALPRAZOLAM 0.25 MG PO TABS: 0.2500 mg | ORAL_TABLET | Freq: Two times a day (BID) | ORAL | 0 refills | Status: DC | PRN

## 2019-12-24 MED ORDER — DEXAMETHASONE 4 MG PO TABS
ORAL_TABLET | ORAL | Status: AC
  Filled 2019-12-24: qty 10

## 2019-12-24 MED ORDER — ACYCLOVIR 800 MG PO TABS: 800.0000 mg | ORAL_TABLET | Freq: Two times a day (BID) | ORAL | 2 refills | Status: DC

## 2019-12-24 MED ORDER — ONDANSETRON HCL 8 MG PO TABS
ORAL_TABLET | ORAL | Status: AC
  Filled 2019-12-24: qty 1

## 2019-12-24 MED ORDER — DEXAMETHASONE 4 MG PO TABS
40.0000 mg | ORAL_TABLET | Freq: Once | ORAL | Status: AC
  Administered 2019-12-24: 40 mg via ORAL

## 2019-12-24 MED ORDER — BORTEZOMIB CHEMO SQ INJECTION 3.5 MG (2.5MG/ML)
1.3000 mg/m2 | Freq: Once | INTRAMUSCULAR | Status: AC
  Administered 2019-12-24: 3 mg via SUBCUTANEOUS
  Filled 2019-12-24: qty 1.2

## 2019-12-24 MED ORDER — ERGOCALCIFEROL 1.25 MG (50000 UT) PO CAPS: 50000.0000 [IU] | ORAL_CAPSULE | ORAL | 0 refills | Status: DC

## 2019-12-24 NOTE — Telephone Encounter (Signed)
Received message from infusion nurse that patient needs refills on his Vitamin D, Xanax and Acyclovir.

## 2019-12-25 ENCOUNTER — Other Ambulatory Visit: Payer: Self-pay | Admitting: *Deleted

## 2019-12-25 DIAGNOSIS — C9000 Multiple myeloma not having achieved remission: Secondary | ICD-10-CM

## 2019-12-25 MED ORDER — LENALIDOMIDE 15 MG PO CAPS: ORAL_CAPSULE | ORAL | 0 refills | Status: DC

## 2019-12-28 ENCOUNTER — Other Ambulatory Visit: Payer: Self-pay | Admitting: Oncology

## 2019-12-28 DIAGNOSIS — C9000 Multiple myeloma not having achieved remission: Secondary | ICD-10-CM

## 2019-12-28 NOTE — Telephone Encounter (Signed)
Medication previously refused refill  Please review

## 2020-01-05 ENCOUNTER — Other Ambulatory Visit: Payer: Self-pay | Admitting: Oncology

## 2020-01-07 ENCOUNTER — Inpatient Hospital Stay: Payer: BC Managed Care – PPO

## 2020-01-07 ENCOUNTER — Other Ambulatory Visit: Payer: Self-pay | Admitting: *Deleted

## 2020-01-07 ENCOUNTER — Inpatient Hospital Stay (HOSPITAL_BASED_OUTPATIENT_CLINIC_OR_DEPARTMENT_OTHER): Payer: BC Managed Care – PPO | Admitting: Oncology

## 2020-01-07 ENCOUNTER — Other Ambulatory Visit: Payer: Self-pay

## 2020-01-07 ENCOUNTER — Ambulatory Visit: Payer: BC Managed Care – PPO

## 2020-01-07 VITALS — BP 141/94 | HR 77 | Temp 98.0°F | Resp 18 | Ht 73.0 in | Wt 242.8 lb

## 2020-01-07 DIAGNOSIS — C9000 Multiple myeloma not having achieved remission: Secondary | ICD-10-CM

## 2020-01-07 LAB — CMP (CANCER CENTER ONLY)
ALT: 42 U/L (ref 0–44)
AST: 20 U/L (ref 15–41)
Albumin: 4 g/dL (ref 3.5–5.0)
Alkaline Phosphatase: 81 U/L (ref 38–126)
Anion gap: 6 (ref 5–15)
BUN: 10 mg/dL (ref 6–20)
CO2: 24 mmol/L (ref 22–32)
Calcium: 9.2 mg/dL (ref 8.9–10.3)
Chloride: 110 mmol/L (ref 98–111)
Creatinine: 1.23 mg/dL (ref 0.61–1.24)
GFR, Estimated: >60 mL/min (ref >60)
Glucose, Bld: 121 mg/dL — ABNORMAL HIGH (ref 70–99)
Potassium: 3.7 mmol/L (ref 3.5–5.1)
Sodium: 140 mmol/L (ref 135–145)
Total Bilirubin: 0.5 mg/dL (ref 0.3–1.2)
Total Protein: 7 g/dL (ref 6.5–8.1)

## 2020-01-07 LAB — CBC WITH DIFFERENTIAL (CANCER CENTER ONLY)
Abs Immature Granulocytes: 0.02 10*3/uL (ref 0.00–0.07)
Basophils Absolute: 0.1 10*3/uL (ref 0.0–0.1)
Basophils Relative: 2 %
Eosinophils Absolute: 0 10*3/uL (ref 0.0–0.5)
Eosinophils Relative: 1 %
HCT: 37.9 % — ABNORMAL LOW (ref 39.0–52.0)
Hemoglobin: 12.9 g/dL — ABNORMAL LOW (ref 13.0–17.0)
Immature Granulocytes: 0 %
Lymphocytes Relative: 21 %
Lymphs Abs: 1 10*3/uL (ref 0.7–4.0)
MCH: 27.3 pg (ref 26.0–34.0)
MCHC: 34 g/dL (ref 30.0–36.0)
MCV: 80.3 fL (ref 80.0–100.0)
Monocytes Absolute: 0.5 10*3/uL (ref 0.1–1.0)
Monocytes Relative: 12 %
Neutro Abs: 3 10*3/uL (ref 1.7–7.7)
Neutrophils Relative %: 64 %
Platelet Count: 237 10*3/uL (ref 150–400)
RBC: 4.72 MIL/uL (ref 4.22–5.81)
RDW: 14.3 % (ref 11.5–15.5)
WBC Count: 4.6 10*3/uL (ref 4.0–10.5)
nRBC: 0 % (ref 0.0–0.2)

## 2020-01-07 MED ORDER — SULFAMETHOXAZOLE-TRIMETHOPRIM 800-160 MG PO TABS: 1.0000 | ORAL_TABLET | ORAL | 0 refills | Status: DC
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
## 2020-01-07 NOTE — Progress Notes (Signed)
Patient declined COVID vaccine.

## 2020-01-08 ENCOUNTER — Telehealth: Payer: Self-pay | Admitting: Oncology

## 2020-01-08 LAB — KAPPA/LAMBDA LIGHT CHAINS
Kappa free light chain: 14.9 mg/L (ref 3.3–19.4)
Kappa, lambda light chain ratio: 4.81 — ABNORMAL HIGH (ref 0.26–1.65)
Lambda free light chains: 3.1 mg/L — ABNORMAL LOW (ref 5.7–26.3)

## 2020-01-08 NOTE — Telephone Encounter (Signed)
Scheduled per 10/18 los. Unable to reach pt. Left voicemail with appt time and date.

## 2020-01-18 ENCOUNTER — Other Ambulatory Visit: Payer: Self-pay | Admitting: Oncology

## 2020-01-20 ENCOUNTER — Other Ambulatory Visit: Payer: Self-pay | Admitting: Oncology

## 2020-01-21 ENCOUNTER — Inpatient Hospital Stay: Payer: BC Managed Care – PPO | Attending: Oncology | Admitting: Nurse Practitioner

## 2020-01-21 ENCOUNTER — Encounter: Payer: Self-pay | Admitting: Nurse Practitioner

## 2020-01-21 ENCOUNTER — Other Ambulatory Visit: Payer: Self-pay

## 2020-01-21 VITALS — BP 131/91 | HR 75 | Temp 97.6°F | Resp 17 | Ht 73.0 in | Wt 246.8 lb

## 2020-01-21 DIAGNOSIS — R7402 Elevation of levels of lactic acid dehydrogenase (LDH): Secondary | ICD-10-CM | POA: Insufficient documentation

## 2020-01-21 DIAGNOSIS — G8929 Other chronic pain: Secondary | ICD-10-CM | POA: Insufficient documentation

## 2020-01-21 DIAGNOSIS — C9000 Multiple myeloma not having achieved remission: Secondary | ICD-10-CM | POA: Insufficient documentation

## 2020-01-21 DIAGNOSIS — I10 Essential (primary) hypertension: Secondary | ICD-10-CM | POA: Insufficient documentation

## 2020-01-21 DIAGNOSIS — D649 Anemia, unspecified: Secondary | ICD-10-CM | POA: Insufficient documentation

## 2020-01-21 DIAGNOSIS — Z79899 Other long term (current) drug therapy: Secondary | ICD-10-CM | POA: Insufficient documentation

## 2020-01-21 DIAGNOSIS — Z86718 Personal history of other venous thrombosis and embolism: Secondary | ICD-10-CM | POA: Insufficient documentation

## 2020-01-21 DIAGNOSIS — R0609 Other forms of dyspnea: Secondary | ICD-10-CM | POA: Insufficient documentation

## 2020-01-21 MED ORDER — ERGOCALCIFEROL 1.25 MG (50000 UT) PO CAPS: 50000.0000 [IU] | ORAL_CAPSULE | ORAL | 2 refills | Status: DC
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
## 2020-01-22 ENCOUNTER — Encounter: Payer: Self-pay | Admitting: *Deleted

## 2020-01-22 ENCOUNTER — Telehealth: Payer: Self-pay | Admitting: Nurse Practitioner

## 2020-01-22 ENCOUNTER — Other Ambulatory Visit: Payer: Self-pay | Admitting: *Deleted

## 2020-01-22 MED ORDER — LENALIDOMIDE 10 MG PO CAPS: 10.0000 mg | ORAL_CAPSULE | Freq: Every day | ORAL | 0 refills | Status: DC

## 2020-01-22 NOTE — Progress Notes (Signed)
Confirmed w/patient his Revlimid comes from [place].

## 2020-01-22 NOTE — Telephone Encounter (Signed)
Scheduled appointments per 11/1 los. Called patient, no answer. Left message for patient with appointment date and time. Sent referral through [facility] to [facility].

## 2020-01-24 ENCOUNTER — Telehealth: Payer: Self-pay | Admitting: *Deleted

## 2020-01-24 NOTE — Telephone Encounter (Signed)
Patient left VM expressing anger that his medical records have not been sent to [facility] and there is a lapse in his disability check and he has bills to pay. Reports this should have been 3 weeks ago when he brought in his form (ROI). Called back and informed him the form he brought in on 10/18 was a ROI and the sender did not note what records or date of service they needed, so this RN sent the form back in with this request. Just received fax again today with the specific records they needed and were needed by 01/30/20. He told RN he did not believe [place] or this Therapist, sports. Printed off office notes, labs and chemo flowsheet and faxed to [phone] w/confirmation fax received. Left message with claims manager, Worthy Rancher and requested a call to RN direct # to confirm records were received.

## 2020-01-28 ENCOUNTER — Ambulatory Visit: Payer: BC Managed Care – PPO

## 2020-01-28 ENCOUNTER — Telehealth: Payer: Self-pay | Admitting: *Deleted

## 2020-01-28 ENCOUNTER — Encounter: Payer: Self-pay | Admitting: *Deleted

## 2020-01-28 NOTE — Telephone Encounter (Signed)
Voice mail from [place] w/[employer] that his records were received on 01/25/20 and they need nothing more from this office. Patient notified

## 2020-02-18 ENCOUNTER — Other Ambulatory Visit: Payer: Self-pay

## 2020-02-18 ENCOUNTER — Inpatient Hospital Stay: Payer: Self-pay

## 2020-02-18 ENCOUNTER — Inpatient Hospital Stay (HOSPITAL_BASED_OUTPATIENT_CLINIC_OR_DEPARTMENT_OTHER): Payer: Self-pay | Admitting: Oncology

## 2020-02-18 VITALS — BP 133/92 | HR 79 | Temp 97.5°F | Resp 16 | Ht 73.0 in | Wt 249.4 lb

## 2020-02-18 DIAGNOSIS — C9000 Multiple myeloma not having achieved remission: Secondary | ICD-10-CM

## 2020-02-18 LAB — CBC WITH DIFFERENTIAL (CANCER CENTER ONLY)
Abs Immature Granulocytes: 0.02 10*3/uL (ref 0.00–0.07)
Basophils Absolute: 0.1 10*3/uL (ref 0.0–0.1)
Basophils Relative: 2 %
Eosinophils Absolute: 0.1 10*3/uL (ref 0.0–0.5)
Eosinophils Relative: 2 %
HCT: 39.6 % (ref 39.0–52.0)
Hemoglobin: 13.3 g/dL (ref 13.0–17.0)
Immature Granulocytes: 1 %
Lymphocytes Relative: 29 %
Lymphs Abs: 0.9 10*3/uL (ref 0.7–4.0)
MCH: 27 pg (ref 26.0–34.0)
MCHC: 33.6 g/dL (ref 30.0–36.0)
MCV: 80.3 fL (ref 80.0–100.0)
Monocytes Absolute: 0.3 10*3/uL (ref 0.1–1.0)
Monocytes Relative: 10 %
Neutro Abs: 1.9 10*3/uL (ref 1.7–7.7)
Neutrophils Relative %: 56 %
Platelet Count: 176 10*3/uL (ref 150–400)
RBC: 4.93 MIL/uL (ref 4.22–5.81)
RDW: 14.5 % (ref 11.5–15.5)
WBC Count: 3.3 10*3/uL — ABNORMAL LOW (ref 4.0–10.5)
nRBC: 0 % (ref 0.0–0.2)

## 2020-02-18 LAB — CMP (CANCER CENTER ONLY)
ALT: 21 U/L (ref 0–44)
AST: 18 U/L (ref 15–41)
Albumin: 4 g/dL (ref 3.5–5.0)
Alkaline Phosphatase: 63 U/L (ref 38–126)
Anion gap: 11 (ref 5–15)
BUN: 10 mg/dL (ref 6–20)
CO2: 23 mmol/L (ref 22–32)
Calcium: 9.1 mg/dL (ref 8.9–10.3)
Chloride: 110 mmol/L (ref 98–111)
Creatinine: 1.28 mg/dL — ABNORMAL HIGH (ref 0.61–1.24)
GFR, Estimated: >60 mL/min (ref >60)
Glucose, Bld: 105 mg/dL — ABNORMAL HIGH (ref 70–99)
Potassium: 3.3 mmol/L — ABNORMAL LOW (ref 3.5–5.1)
Sodium: 144 mmol/L (ref 135–145)
Total Bilirubin: 0.6 mg/dL (ref 0.3–1.2)
Total Protein: 6.7 g/dL (ref 6.5–8.1)
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
## 2020-02-19 ENCOUNTER — Telehealth: Payer: Self-pay | Admitting: Oncology

## 2020-02-19 LAB — KAPPA/LAMBDA LIGHT CHAINS
Kappa free light chain: 22.9 mg/L — ABNORMAL HIGH (ref 3.3–19.4)
Kappa, lambda light chain ratio: 2.79 — ABNORMAL HIGH (ref 0.26–1.65)
Lambda free light chains: 8.2 mg/L (ref 5.7–26.3)

## 2020-02-19 NOTE — Telephone Encounter (Signed)
Scheduled appointments per 11/29 los. Called patient, no answer. Left message for patient with appointments dates and times.

## 2020-02-20 ENCOUNTER — Other Ambulatory Visit: Payer: Self-pay | Admitting: *Deleted

## 2020-02-20 ENCOUNTER — Encounter: Payer: Self-pay | Admitting: *Deleted

## 2020-02-20 DIAGNOSIS — C9000 Multiple myeloma not having achieved remission: Secondary | ICD-10-CM

## 2020-02-20 NOTE — Progress Notes (Signed)
Received post-transplant vaccine schedule from [place]. They will administer the month 9 vaccines at visit there on 04/17/20 and the 12 month vaccines on 07/22/20. Copy of schedule forwarded to [place].

## 2020-02-21 ENCOUNTER — Other Ambulatory Visit: Payer: Self-pay | Admitting: *Deleted

## 2020-02-21 MED ORDER — LENALIDOMIDE 10 MG PO CAPS: 10.0000 mg | ORAL_CAPSULE | Freq: Every day | ORAL | 0 refills | Status: DC

## 2020-02-21 NOTE — Telephone Encounter (Signed)
Received faxed refill for Revlimid. Due next cycle on 02/28/20.

## 2020-02-27 ENCOUNTER — Inpatient Hospital Stay: Payer: BC Managed Care – PPO

## 2020-03-06 ENCOUNTER — Other Ambulatory Visit: Payer: Self-pay | Admitting: Oncology

## 2020-03-06 DIAGNOSIS — C9000 Multiple myeloma not having achieved remission: Secondary | ICD-10-CM

## 2020-03-11 ENCOUNTER — Encounter: Payer: Self-pay | Admitting: *Deleted

## 2020-03-11 NOTE — Progress Notes (Signed)
Received PA form for Revlimid from [facility]. Completed and signed by MD and faxed back to [phone].

## 2020-03-12 ENCOUNTER — Encounter: Payer: Self-pay | Admitting: *Deleted

## 2020-03-12 NOTE — Progress Notes (Signed)
Notified by [facility] that Revlimid has been approved from 03/11/20 till 03/11/2021.

## 2020-03-22 ENCOUNTER — Other Ambulatory Visit: Payer: Self-pay | Admitting: Oncology

## 2020-03-22 DIAGNOSIS — C9000 Multiple myeloma not having achieved remission: Secondary | ICD-10-CM

## 2020-03-25 ENCOUNTER — Inpatient Hospital Stay: Payer: BC Managed Care – PPO | Admitting: Nurse Practitioner

## 2020-03-25 ENCOUNTER — Inpatient Hospital Stay: Payer: BC Managed Care – PPO

## 2020-03-25 ENCOUNTER — Encounter: Payer: Self-pay | Admitting: *Deleted

## 2020-03-25 NOTE — Progress Notes (Addendum)
Received fax from [facility] that Eliquis refill requires PA. Forwarded form to [place], [place]  03/27/2019 @ [phone] received message from collaborative nurse to cancel Eliquis prior authorization.  Eliquis not Formulary.  Formulary medication (Xarelto) order being placed.  RWS, R.N.

## 2020-03-26 ENCOUNTER — Other Ambulatory Visit: Payer: Self-pay | Admitting: *Deleted

## 2020-03-26 MED ORDER — RIVAROXABAN 20 MG PO TABS: 20.0000 mg | ORAL_TABLET | Freq: Every day | ORAL | 1 refills | Status: DC

## 2020-03-26 NOTE — Progress Notes (Signed)
Script for Xarelto sent to pharmacy since insurance no longer covers Eliquis.

## 2020-03-28 ENCOUNTER — Other Ambulatory Visit: Payer: Self-pay | Admitting: *Deleted

## 2020-03-28 MED ORDER — LENALIDOMIDE 10 MG PO CAPS: 10.0000 mg | ORAL_CAPSULE | Freq: Every day | ORAL | 0 refills | Status: DC

## 2020-04-02 ENCOUNTER — Encounter: Payer: Self-pay | Admitting: *Deleted

## 2020-04-02 ENCOUNTER — Other Ambulatory Visit: Payer: Self-pay | Admitting: Oncology

## 2020-04-02 DIAGNOSIS — C9000 Multiple myeloma not having achieved remission: Secondary | ICD-10-CM

## 2020-04-02 NOTE — Progress Notes (Signed)
Patient reports insurance issue has been resolved and he is ready to come in for appointments not. Scheduling message sent.

## 2020-04-04 ENCOUNTER — Telehealth: Payer: Self-pay | Admitting: Oncology

## 2020-04-04 NOTE — Telephone Encounter (Signed)
Scheduled appt per 1/12 sch msg - unable to reach pt. Left message for patient with appt date and time

## 2020-04-11 ENCOUNTER — Inpatient Hospital Stay (HOSPITAL_BASED_OUTPATIENT_CLINIC_OR_DEPARTMENT_OTHER): Payer: BC Managed Care – PPO | Admitting: Nurse Practitioner

## 2020-04-11 ENCOUNTER — Encounter: Payer: Self-pay | Admitting: Nurse Practitioner

## 2020-04-11 ENCOUNTER — Inpatient Hospital Stay: Payer: BC Managed Care – PPO

## 2020-04-11 ENCOUNTER — Other Ambulatory Visit: Payer: Self-pay | Admitting: Nurse Practitioner

## 2020-04-11 ENCOUNTER — Inpatient Hospital Stay: Payer: BC Managed Care – PPO | Attending: Oncology

## 2020-04-11 ENCOUNTER — Other Ambulatory Visit: Payer: Self-pay

## 2020-04-11 VITALS — BP 126/85 | HR 75 | Temp 97.6°F | Resp 17 | Ht 73.0 in | Wt 253.2 lb

## 2020-04-11 DIAGNOSIS — R21 Rash and other nonspecific skin eruption: Secondary | ICD-10-CM | POA: Diagnosis not present

## 2020-04-11 DIAGNOSIS — R197 Diarrhea, unspecified: Secondary | ICD-10-CM | POA: Diagnosis not present

## 2020-04-11 DIAGNOSIS — C9000 Multiple myeloma not having achieved remission: Secondary | ICD-10-CM

## 2020-04-11 DIAGNOSIS — Z79899 Other long term (current) drug therapy: Secondary | ICD-10-CM | POA: Diagnosis not present

## 2020-04-11 DIAGNOSIS — Z7901 Long term (current) use of anticoagulants: Secondary | ICD-10-CM | POA: Diagnosis not present

## 2020-04-11 DIAGNOSIS — D701 Agranulocytosis secondary to cancer chemotherapy: Secondary | ICD-10-CM | POA: Diagnosis not present

## 2020-04-11 DIAGNOSIS — G893 Neoplasm related pain (acute) (chronic): Secondary | ICD-10-CM | POA: Insufficient documentation

## 2020-04-11 DIAGNOSIS — I10 Essential (primary) hypertension: Secondary | ICD-10-CM | POA: Insufficient documentation

## 2020-04-11 DIAGNOSIS — Z86718 Personal history of other venous thrombosis and embolism: Secondary | ICD-10-CM | POA: Diagnosis present

## 2020-04-11 DIAGNOSIS — D649 Anemia, unspecified: Secondary | ICD-10-CM | POA: Insufficient documentation

## 2020-04-11 DIAGNOSIS — T451X5A Adverse effect of antineoplastic and immunosuppressive drugs, initial encounter: Secondary | ICD-10-CM | POA: Diagnosis not present

## 2020-04-11 LAB — CBC WITH DIFFERENTIAL (CANCER CENTER ONLY)
Abs Immature Granulocytes: 0.03 10*3/uL (ref 0.00–0.07)
Basophils Absolute: 0.1 10*3/uL (ref 0.0–0.1)
Basophils Relative: 3 %
Eosinophils Absolute: 0.1 10*3/uL (ref 0.0–0.5)
Eosinophils Relative: 2 %
HCT: 39.3 % (ref 39.0–52.0)
Hemoglobin: 13 g/dL (ref 13.0–17.0)
Immature Granulocytes: 1 %
Lymphocytes Relative: 33 %
Lymphs Abs: 1.2 10*3/uL (ref 0.7–4.0)
MCH: 26.8 pg (ref 26.0–34.0)
MCHC: 33.1 g/dL (ref 30.0–36.0)
MCV: 81 fL (ref 80.0–100.0)
Monocytes Absolute: 0.6 10*3/uL (ref 0.1–1.0)
Monocytes Relative: 16 %
Neutro Abs: 1.6 10*3/uL — ABNORMAL LOW (ref 1.7–7.7)
Neutrophils Relative %: 45 %
Platelet Count: 224 10*3/uL (ref 150–400)
RBC: 4.85 MIL/uL (ref 4.22–5.81)
RDW: 13.7 % (ref 11.5–15.5)
WBC Count: 3.6 10*3/uL — ABNORMAL LOW (ref 4.0–10.5)
nRBC: 0 % (ref 0.0–0.2)

## 2020-04-11 LAB — CMP (CANCER CENTER ONLY)
ALT: 63 U/L — ABNORMAL HIGH (ref 0–44)
AST: 42 U/L — ABNORMAL HIGH (ref 15–41)
Albumin: 4 g/dL (ref 3.5–5.0)
Alkaline Phosphatase: 74 U/L (ref 38–126)
Anion gap: 7 (ref 5–15)
BUN: 10 mg/dL (ref 6–20)
CO2: 24 mmol/L (ref 22–32)
Calcium: 9.1 mg/dL (ref 8.9–10.3)
Chloride: 109 mmol/L (ref 98–111)
Creatinine: 1.3 mg/dL — ABNORMAL HIGH (ref 0.61–1.24)
GFR, Estimated: >60 mL/min (ref >60)
Glucose, Bld: 90 mg/dL (ref 70–99)
Potassium: 3.7 mmol/L (ref 3.5–5.1)
Sodium: 140 mmol/L (ref 135–145)
Total Bilirubin: 0.4 mg/dL (ref 0.3–1.2)
Total Protein: 6.8 g/dL (ref 6.5–8.1)

## 2020-04-11 MED ORDER — ZOLEDRONIC ACID 4 MG/100ML IV SOLN
INTRAVENOUS | Status: AC
  Filled 2020-04-11: qty 100

## 2020-04-11 MED ORDER — ZOLEDRONIC ACID 4 MG/100ML IV SOLN
4.0000 mg | Freq: Once | INTRAVENOUS | Status: AC
  Administered 2020-04-11: 4 mg via INTRAVENOUS

## 2020-04-11 MED ORDER — SODIUM CHLORIDE 0.9 % IV SOLN
Freq: Once | INTRAVENOUS | Status: AC
  Filled 2020-04-11: qty 250
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
## 2020-04-11 NOTE — Patient Instructions (Signed)
Zoledronic Acid Injection (Hypercalcemia, Oncology) What is this medicine? ZOLEDRONIC ACID (ZOE le dron ik AS id) slows calcium loss from bones. It high calcium levels in the blood from some kinds of cancer. It may be used in other people at risk for bone loss. This medicine may be used for other purposes; ask your health care provider or pharmacist if you have questions. COMMON BRAND NAME(S): Zometa What should I tell my health care provider before I take this medicine? They need to know if you have any of these conditions:  cancer  dehydration  dental disease  kidney disease  liver disease  low levels of calcium in the blood  lung or breathing disease (asthma)  receiving steroids like dexamethasone or prednisone  an unusual or allergic reaction to zoledronic acid, other medicines, foods, dyes, or preservatives  pregnant or trying to get pregnant  breast-feeding How should I use this medicine? This drug is injected into a vein. It is given by a health care provider in a hospital or clinic setting. Talk to your health care provider about the use of this drug in children. Special care may be needed. Overdosage: If you think you have taken too much of this medicine contact a poison control center or emergency room at once. NOTE: This medicine is only for you. Do not share this medicine with others. What if I miss a dose? Keep appointments for follow-up doses. It is important not to miss your dose. Call your health care provider if you are unable to keep an appointment. What may interact with this medicine?  certain antibiotics given by injection  NSAIDs, medicines for pain and inflammation, like ibuprofen or naproxen  some diuretics like bumetanide, furosemide  teriparatide  thalidomide This list may not describe all possible interactions. Give your health care provider a list of all the medicines, herbs, non-prescription drugs, or dietary supplements you use. Also tell  them if you smoke, drink alcohol, or use illegal drugs. Some items may interact with your medicine. What should I watch for while using this medicine? Visit your health care provider for regular checks on your progress. It may be some time before you see the benefit from this drug. Some people who take this drug have severe bone, joint, or muscle pain. This drug may also increase your risk for jaw problems or a broken thigh bone. Tell your health care provider right away if you have severe pain in your jaw, bones, joints, or muscles. Tell you health care provider if you have any pain that does not go away or that gets worse. Tell your dentist and dental surgeon that you are taking this drug. You should not have major dental surgery while on this drug. See your dentist to have a dental exam and fix any dental problems before starting this drug. Take good care of your teeth while on this drug. Make sure you see your dentist for regular follow-up appointments. You should make sure you get enough calcium and vitamin D while you are taking this drug. Discuss the foods you eat and the vitamins you take with your health care provider. Check with your health care provider if you have severe diarrhea, nausea, and vomiting, or if you sweat a lot. The loss of too much body fluid may make it dangerous for you to take this drug. You may need blood work done while you are taking this drug. Do not become pregnant while taking this drug. Women should inform their health care provider   if they wish to become pregnant or think they might be pregnant. There is potential for serious harm to an unborn child. Talk to your health care provider for more information. What side effects may I notice from receiving this medicine? Side effects that you should report to your doctor or health care provider as soon as possible:  allergic reactions (skin rash, itching or hives; swelling of the face, lips, or tongue)  bone  pain  infection (fever, chills, cough, sore throat, pain or trouble passing urine)  jaw pain, especially after dental work  joint pain  kidney injury (trouble passing urine or change in the amount of urine)  low blood pressure (dizziness; feeling faint or lightheaded, falls; unusually weak or tired)  low calcium levels (fast heartbeat; muscle cramps or pain; pain, tingling, or numbness in the hands or feet; seizures)  low magnesium levels (fast, irregular heartbeat; muscle cramp or pain; muscle weakness; tremors; seizures)  low red blood cell counts (trouble breathing; feeling faint; lightheaded, falls; unusually weak or tired)  muscle pain  redness, blistering, peeling, or loosening of the skin, including inside the mouth  severe diarrhea  swelling of the ankles, feet, hands  trouble breathing Side effects that usually do not require medical attention (report to your doctor or health care provider if they continue or are bothersome):  anxious  constipation  coughing  depressed mood  eye irritation, itching, or pain  fever  general ill feeling or flu-like symptoms  nausea  pain, redness, or irritation at site where injected  trouble sleeping This list may not describe all possible side effects. Call your doctor for medical advice about side effects. You may report side effects to FDA at 1-800-FDA-1088. Where should I keep my medicine? This drug is given in a hospital or clinic. It will not be stored at home. NOTE: This sheet is a summary. It may not cover all possible information. If you have questions about this medicine, talk to your doctor, pharmacist, or health care provider.  2021 Elsevier/Gold Standard (2018-12-21 09:13:00)  

## 2020-04-14 ENCOUNTER — Telehealth: Payer: Self-pay | Admitting: Nurse Practitioner

## 2020-04-14 LAB — PROTEIN ELECTROPHORESIS, SERUM
A/G Ratio: 1.5 (ref 0.7–1.7)
Albumin ELP: 4 g/dL (ref 2.9–4.4)
Alpha-1-Globulin: 0.2 g/dL (ref 0.0–0.4)
Alpha-2-Globulin: 0.8 g/dL (ref 0.4–1.0)
Beta Globulin: 1.1 g/dL (ref 0.7–1.3)
Gamma Globulin: 0.6 g/dL (ref 0.4–1.8)
Globulin, Total: 2.7 g/dL (ref 2.2–3.9)
Total Protein ELP: 6.7 g/dL (ref 6.0–8.5)

## 2020-04-14 LAB — KAPPA/LAMBDA LIGHT CHAINS
Kappa free light chain: 18.1 mg/L (ref 3.3–19.4)
Kappa, lambda light chain ratio: 2.03 — ABNORMAL HIGH (ref 0.26–1.65)
Lambda free light chains: 8.9 mg/L (ref 5.7–26.3)

## 2020-04-14 NOTE — Telephone Encounter (Signed)
Scheduled appointment per 1/21 los. Spoke to patient who is aware of appointments date and times.

## 2020-04-18 ENCOUNTER — Other Ambulatory Visit: Payer: Self-pay | Admitting: Oncology

## 2020-04-24 ENCOUNTER — Other Ambulatory Visit: Payer: Self-pay

## 2020-04-24 MED ORDER — LENALIDOMIDE 10 MG PO CAPS: 10.0000 mg | ORAL_CAPSULE | Freq: Every day | ORAL | 0 refills | Status: DC

## 2020-05-01 ENCOUNTER — Inpatient Hospital Stay: Payer: BC Managed Care – PPO | Attending: Oncology

## 2020-05-01 ENCOUNTER — Encounter: Payer: Self-pay | Admitting: *Deleted

## 2020-05-01 ENCOUNTER — Other Ambulatory Visit: Payer: Self-pay

## 2020-05-01 ENCOUNTER — Inpatient Hospital Stay (HOSPITAL_BASED_OUTPATIENT_CLINIC_OR_DEPARTMENT_OTHER): Payer: BC Managed Care – PPO | Admitting: Oncology

## 2020-05-01 VITALS — BP 132/90 | HR 84 | Temp 97.8°F | Resp 16 | Ht 73.0 in | Wt 251.2 lb

## 2020-05-01 DIAGNOSIS — D649 Anemia, unspecified: Secondary | ICD-10-CM | POA: Insufficient documentation

## 2020-05-01 DIAGNOSIS — C9000 Multiple myeloma not having achieved remission: Secondary | ICD-10-CM | POA: Diagnosis not present

## 2020-05-01 DIAGNOSIS — Z79899 Other long term (current) drug therapy: Secondary | ICD-10-CM | POA: Insufficient documentation

## 2020-05-01 DIAGNOSIS — Z86718 Personal history of other venous thrombosis and embolism: Secondary | ICD-10-CM | POA: Diagnosis not present

## 2020-05-01 DIAGNOSIS — R21 Rash and other nonspecific skin eruption: Secondary | ICD-10-CM | POA: Diagnosis not present

## 2020-05-01 DIAGNOSIS — D709 Neutropenia, unspecified: Secondary | ICD-10-CM | POA: Diagnosis not present

## 2020-05-01 DIAGNOSIS — I10 Essential (primary) hypertension: Secondary | ICD-10-CM | POA: Insufficient documentation

## 2020-05-01 DIAGNOSIS — Z7901 Long term (current) use of anticoagulants: Secondary | ICD-10-CM | POA: Insufficient documentation

## 2020-05-01 LAB — CMP (CANCER CENTER ONLY)
ALT: 26 U/L (ref 0–44)
AST: 21 U/L (ref 15–41)
Albumin: 4 g/dL (ref 3.5–5.0)
Alkaline Phosphatase: 74 U/L (ref 38–126)
Anion gap: 10 (ref 5–15)
BUN: 9 mg/dL (ref 6–20)
CO2: 21 mmol/L — ABNORMAL LOW (ref 22–32)
Calcium: 8.9 mg/dL (ref 8.9–10.3)
Chloride: 111 mmol/L (ref 98–111)
Creatinine: 1.33 mg/dL — ABNORMAL HIGH (ref 0.61–1.24)
GFR, Estimated: >60 mL/min (ref >60)
Glucose, Bld: 105 mg/dL — ABNORMAL HIGH (ref 70–99)
Potassium: 3.7 mmol/L (ref 3.5–5.1)
Sodium: 142 mmol/L (ref 135–145)
Total Bilirubin: 0.6 mg/dL (ref 0.3–1.2)
Total Protein: 7 g/dL (ref 6.5–8.1)

## 2020-05-01 LAB — CBC WITH DIFFERENTIAL (CANCER CENTER ONLY)
Abs Immature Granulocytes: 0.01 10*3/uL (ref 0.00–0.07)
Basophils Absolute: 0.1 10*3/uL (ref 0.0–0.1)
Basophils Relative: 3 %
Eosinophils Absolute: 0.1 10*3/uL (ref 0.0–0.5)
Eosinophils Relative: 4 %
HCT: 39.8 % (ref 39.0–52.0)
Hemoglobin: 13.5 g/dL (ref 13.0–17.0)
Immature Granulocytes: 0 %
Lymphocytes Relative: 42 %
Lymphs Abs: 1.6 10*3/uL (ref 0.7–4.0)
MCH: 26.6 pg (ref 26.0–34.0)
MCHC: 33.9 g/dL (ref 30.0–36.0)
MCV: 78.3 fL — ABNORMAL LOW (ref 80.0–100.0)
Monocytes Absolute: 0.4 10*3/uL (ref 0.1–1.0)
Monocytes Relative: 10 %
Neutro Abs: 1.5 10*3/uL — ABNORMAL LOW (ref 1.7–7.7)
Neutrophils Relative %: 41 %
Platelet Count: 235 10*3/uL (ref 150–400)
RBC: 5.08 MIL/uL (ref 4.22–5.81)
RDW: 13.2 % (ref 11.5–15.5)
WBC Count: 3.8 10*3/uL — ABNORMAL LOW (ref 4.0–10.5)
nRBC: 0 % (ref 0.0–0.2)

## 2020-05-01 MED ORDER — ACYCLOVIR 800 MG PO TABS: 800.0000 mg | ORAL_TABLET | Freq: Two times a day (BID) | ORAL | 2 refills | Status: DC
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
## 2020-05-02 ENCOUNTER — Other Ambulatory Visit: Payer: Self-pay | Admitting: *Deleted

## 2020-05-02 ENCOUNTER — Telehealth: Payer: Self-pay | Admitting: Oncology

## 2020-05-02 MED ORDER — DABIGATRAN ETEXILATE MESYLATE 150 MG PO CAPS: 150.0000 mg | ORAL_CAPSULE | Freq: Two times a day (BID) | ORAL | 0 refills | Status: DC

## 2020-05-02 NOTE — Telephone Encounter (Signed)
Scheduled appointments per 2/10 los. Called patient, no answer. Left message with appointments date and times.

## 2020-05-02 NOTE — Progress Notes (Signed)
Changed Xarelto to Pradaxa due to cost prohibitive on Xarelto per Dr. Benay Spice.

## 2020-05-05 ENCOUNTER — Telehealth: Payer: Self-pay | Admitting: *Deleted

## 2020-05-05 MED ORDER — APIXABAN 5 MG PO TABS: 5.0000 mg | ORAL_TABLET | Freq: Two times a day (BID) | ORAL | 0 refills | Status: DC

## 2020-05-05 NOTE — Telephone Encounter (Addendum)
The Pradaxa was not covered by his BCBS plan and his co-pay is over $400. Rosalind, RN in managed care registered him for a co-pay card with [employer]. D/C'd Pradaxa and called in Eliquis 5 mg bid to pharmacy and faxed copy of the activated co-pay card to pharmacy to process. Pharmacy has sent CoverMyMeds [place] form to office for Eliquis. Forwarded to [place], Therapist, sports to complete.

## 2020-05-06 ENCOUNTER — Encounter: Payer: Self-pay | Admitting: *Deleted

## 2020-05-06 NOTE — Progress Notes (Signed)
Patient came to office to complete his portion of forms for extension of work disability. Dr. Benay Spice has extended him out 6 months before it will be re-evaluated. Provider portion completed and all forms faxed to employer at [phone]. Confirmation received. Copy sent to HIM to be scanned.

## 2020-05-13 ENCOUNTER — Telehealth: Payer: Self-pay | Admitting: *Deleted

## 2020-05-13 NOTE — Telephone Encounter (Signed)
Patient informed nurse that pharmacy ran the $10 copay card with ID #[id number] and RxBIN [phone] and it had expired. His cost out of pocket with his insurance is $153.10 and he did not pick up the med due to inability to pay.

## 2020-05-15 ENCOUNTER — Encounter: Payer: Self-pay | Admitting: Nurse Practitioner

## 2020-05-15 NOTE — Progress Notes (Signed)
Called Eliquis 360 Support(Jamil) @ [phone] per referral message received regarding copay card. Was informed patient has to call himself.  Called patient to provide this information. Sent him a my chart message advising to press "0" to speak with a representative.

## 2020-05-21 ENCOUNTER — Other Ambulatory Visit: Payer: Self-pay | Admitting: *Deleted

## 2020-05-21 MED ORDER — LENALIDOMIDE 10 MG PO CAPS: 10.0000 mg | ORAL_CAPSULE | Freq: Every day | ORAL | 0 refills | Status: DC

## 2020-05-21 NOTE — Telephone Encounter (Signed)
Incoming fax from [facility] for refill on Lenalidomide 10 mg.

## 2020-05-27 ENCOUNTER — Inpatient Hospital Stay (HOSPITAL_BASED_OUTPATIENT_CLINIC_OR_DEPARTMENT_OTHER): Payer: BC Managed Care – PPO | Admitting: Nurse Practitioner

## 2020-05-27 ENCOUNTER — Encounter: Payer: Self-pay | Admitting: Nurse Practitioner

## 2020-05-27 ENCOUNTER — Encounter: Payer: Self-pay | Admitting: *Deleted

## 2020-05-27 ENCOUNTER — Other Ambulatory Visit: Payer: Self-pay

## 2020-05-27 ENCOUNTER — Inpatient Hospital Stay: Payer: BC Managed Care – PPO | Attending: Oncology

## 2020-05-27 VITALS — BP 134/98 | HR 82 | Temp 97.3°F | Resp 20 | Ht 73.0 in

## 2020-05-27 DIAGNOSIS — C9000 Multiple myeloma not having achieved remission: Secondary | ICD-10-CM | POA: Insufficient documentation

## 2020-05-27 DIAGNOSIS — Z86718 Personal history of other venous thrombosis and embolism: Secondary | ICD-10-CM | POA: Diagnosis not present

## 2020-05-27 DIAGNOSIS — D649 Anemia, unspecified: Secondary | ICD-10-CM | POA: Insufficient documentation

## 2020-05-27 DIAGNOSIS — R197 Diarrhea, unspecified: Secondary | ICD-10-CM | POA: Diagnosis not present

## 2020-05-27 DIAGNOSIS — Z79899 Other long term (current) drug therapy: Secondary | ICD-10-CM | POA: Diagnosis not present

## 2020-05-27 DIAGNOSIS — I10 Essential (primary) hypertension: Secondary | ICD-10-CM | POA: Insufficient documentation

## 2020-05-27 LAB — CBC WITH DIFFERENTIAL (CANCER CENTER ONLY)
Abs Immature Granulocytes: 0.01 10*3/uL (ref 0.00–0.07)
Basophils Absolute: 0.1 10*3/uL (ref 0.0–0.1)
Basophils Relative: 3 %
Eosinophils Absolute: 0.1 10*3/uL (ref 0.0–0.5)
Eosinophils Relative: 3 %
HCT: 42.9 % (ref 39.0–52.0)
Hemoglobin: 14.2 g/dL (ref 13.0–17.0)
Immature Granulocytes: 0 %
Lymphocytes Relative: 38 %
Lymphs Abs: 1.6 10*3/uL (ref 0.7–4.0)
MCH: 25.9 pg — ABNORMAL LOW (ref 26.0–34.0)
MCHC: 33.1 g/dL (ref 30.0–36.0)
MCV: 78.3 fL — ABNORMAL LOW (ref 80.0–100.0)
Monocytes Absolute: 0.6 10*3/uL (ref 0.1–1.0)
Monocytes Relative: 15 %
Neutro Abs: 1.7 10*3/uL (ref 1.7–7.7)
Neutrophils Relative %: 41 %
Platelet Count: 220 10*3/uL (ref 150–400)
RBC: 5.48 MIL/uL (ref 4.22–5.81)
RDW: 13.6 % (ref 11.5–15.5)
WBC Count: 4.2 10*3/uL (ref 4.0–10.5)
nRBC: 0 % (ref 0.0–0.2)

## 2020-05-27 LAB — CMP (CANCER CENTER ONLY)
ALT: 43 U/L (ref 0–44)
AST: 38 U/L (ref 15–41)
Albumin: 4.3 g/dL (ref 3.5–5.0)
Alkaline Phosphatase: 74 U/L (ref 38–126)
Anion gap: 10 (ref 5–15)
BUN: 11 mg/dL (ref 6–20)
CO2: 22 mmol/L (ref 22–32)
Calcium: 9.2 mg/dL (ref 8.9–10.3)
Chloride: 108 mmol/L (ref 98–111)
Creatinine: 1.37 mg/dL — ABNORMAL HIGH (ref 0.61–1.24)
GFR, Estimated: >60 mL/min (ref >60)
Glucose, Bld: 86 mg/dL (ref 70–99)
Potassium: 3.7 mmol/L (ref 3.5–5.1)
Sodium: 140 mmol/L (ref 135–145)
Total Bilirubin: 0.3 mg/dL (ref 0.3–1.2)
Total Protein: 7.4 g/dL (ref 6.5–8.1)
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
## 2020-05-27 NOTE — Progress Notes (Signed)
Patient sent in forms from [employer] for assistance with his Revlimid cost. Forwarded forms to Wynn Maudlin in oral chemo department.

## 2020-05-28 ENCOUNTER — Encounter: Payer: Self-pay | Admitting: *Deleted

## 2020-05-28 LAB — KAPPA/LAMBDA LIGHT CHAINS
Kappa free light chain: 20.2 mg/L — ABNORMAL HIGH (ref 3.3–19.4)
Kappa, lambda light chain ratio: 1.66 — ABNORMAL HIGH (ref 0.26–1.65)
Lambda free light chains: 12.2 mg/L (ref 5.7–26.3)

## 2020-05-28 NOTE — Progress Notes (Signed)
Faxed referral order, demographics and chart information to [facility] # [phone] °

## 2020-05-29 ENCOUNTER — Telehealth: Payer: Self-pay | Admitting: *Deleted

## 2020-05-29 LAB — PROTEIN ELECTROPHORESIS, SERUM
A/G Ratio: 1.5 (ref 0.7–1.7)
Albumin ELP: 4.1 g/dL (ref 2.9–4.4)
Alpha-1-Globulin: 0.2 g/dL (ref 0.0–0.4)
Alpha-2-Globulin: 0.7 g/dL (ref 0.4–1.0)
Beta Globulin: 1.2 g/dL (ref 0.7–1.3)
Gamma Globulin: 0.7 g/dL (ref 0.4–1.8)
Globulin, Total: 2.8 g/dL (ref 2.2–3.9)
Total Protein ELP: 6.9 g/dL (ref 6.0–8.5)

## 2020-05-29 NOTE — Telephone Encounter (Signed)
Call from [employer] to f/u on status of his new application for assistance with his Revlimid copay. Informed her that we just received the forms yesterday and they were forwarded to oral oncology program to complete

## 2020-06-05 ENCOUNTER — Telehealth: Payer: Self-pay | Admitting: Nurse Practitioner

## 2020-06-05 NOTE — Telephone Encounter (Signed)
I called and left a detailed message regarding appointments for 4/5 being rescheduled to 4/7.  I advised should he have any questions he can call the office .

## 2020-06-07 ENCOUNTER — Other Ambulatory Visit: Payer: Self-pay | Admitting: Oncology

## 2020-06-08 ENCOUNTER — Other Ambulatory Visit: Payer: Self-pay | Admitting: Oncology

## 2020-06-08 DIAGNOSIS — C9000 Multiple myeloma not having achieved remission: Secondary | ICD-10-CM

## 2020-06-24 ENCOUNTER — Ambulatory Visit: Payer: BC Managed Care – PPO

## 2020-06-24 ENCOUNTER — Other Ambulatory Visit: Payer: BC Managed Care – PPO

## 2020-06-24 ENCOUNTER — Ambulatory Visit: Payer: BC Managed Care – PPO | Admitting: Nurse Practitioner

## 2020-06-26 ENCOUNTER — Inpatient Hospital Stay: Payer: BC Managed Care – PPO | Attending: Oncology

## 2020-06-26 ENCOUNTER — Telehealth: Payer: Self-pay | Admitting: Oncology

## 2020-06-26 ENCOUNTER — Other Ambulatory Visit: Payer: Self-pay

## 2020-06-26 ENCOUNTER — Inpatient Hospital Stay: Payer: BC Managed Care – PPO

## 2020-06-26 ENCOUNTER — Encounter: Payer: Self-pay | Admitting: Nurse Practitioner

## 2020-06-26 ENCOUNTER — Inpatient Hospital Stay (HOSPITAL_BASED_OUTPATIENT_CLINIC_OR_DEPARTMENT_OTHER): Payer: BC Managed Care – PPO | Admitting: Nurse Practitioner

## 2020-06-26 VITALS — BP 154/94 | HR 87 | Temp 98.1°F | Resp 18 | Ht 73.0 in | Wt 247.2 lb

## 2020-06-26 VITALS — BP 126/94 | HR 62 | Temp 98.2°F | Resp 20

## 2020-06-26 DIAGNOSIS — Z86718 Personal history of other venous thrombosis and embolism: Secondary | ICD-10-CM | POA: Insufficient documentation

## 2020-06-26 DIAGNOSIS — Z9221 Personal history of antineoplastic chemotherapy: Secondary | ICD-10-CM | POA: Insufficient documentation

## 2020-06-26 DIAGNOSIS — D649 Anemia, unspecified: Secondary | ICD-10-CM | POA: Insufficient documentation

## 2020-06-26 DIAGNOSIS — C9000 Multiple myeloma not having achieved remission: Secondary | ICD-10-CM | POA: Diagnosis present

## 2020-06-26 DIAGNOSIS — Z79899 Other long term (current) drug therapy: Secondary | ICD-10-CM | POA: Insufficient documentation

## 2020-06-26 DIAGNOSIS — I10 Essential (primary) hypertension: Secondary | ICD-10-CM | POA: Diagnosis not present

## 2020-06-26 LAB — CBC WITH DIFFERENTIAL (CANCER CENTER ONLY)
Abs Immature Granulocytes: 0.01 10*3/uL (ref 0.00–0.07)
Basophils Absolute: 0.1 10*3/uL (ref 0.0–0.1)
Basophils Relative: 1 %
Eosinophils Absolute: 0.2 10*3/uL (ref 0.0–0.5)
Eosinophils Relative: 4 %
HCT: 41.5 % (ref 39.0–52.0)
Hemoglobin: 13.9 g/dL (ref 13.0–17.0)
Immature Granulocytes: 0 %
Lymphocytes Relative: 33 %
Lymphs Abs: 1.3 10*3/uL (ref 0.7–4.0)
MCH: 26.2 pg (ref 26.0–34.0)
MCHC: 33.5 g/dL (ref 30.0–36.0)
MCV: 78.3 fL — ABNORMAL LOW (ref 80.0–100.0)
Monocytes Absolute: 0.5 10*3/uL (ref 0.1–1.0)
Monocytes Relative: 13 %
Neutro Abs: 1.9 10*3/uL (ref 1.7–7.7)
Neutrophils Relative %: 49 %
Platelet Count: 195 10*3/uL (ref 150–400)
RBC: 5.3 MIL/uL (ref 4.22–5.81)
RDW: 14.9 % (ref 11.5–15.5)
WBC Count: 3.9 10*3/uL — ABNORMAL LOW (ref 4.0–10.5)
nRBC: 0 % (ref 0.0–0.2)

## 2020-06-26 LAB — CMP (CANCER CENTER ONLY)
ALT: 47 U/L — ABNORMAL HIGH (ref 0–44)
AST: 24 U/L (ref 15–41)
Albumin: 4.4 g/dL (ref 3.5–5.0)
Alkaline Phosphatase: 61 U/L (ref 38–126)
Anion gap: 9 (ref 5–15)
BUN: 9 mg/dL (ref 6–20)
CO2: 24 mmol/L (ref 22–32)
Calcium: 9.3 mg/dL (ref 8.9–10.3)
Chloride: 106 mmol/L (ref 98–111)
Creatinine: 1.43 mg/dL — ABNORMAL HIGH (ref 0.61–1.24)
GFR, Estimated: >60 mL/min (ref >60)
Glucose, Bld: 119 mg/dL — ABNORMAL HIGH (ref 70–99)
Potassium: 3.7 mmol/L (ref 3.5–5.1)
Sodium: 139 mmol/L (ref 135–145)
Total Bilirubin: 0.6 mg/dL (ref 0.3–1.2)
Total Protein: 7.1 g/dL (ref 6.5–8.1)

## 2020-06-26 MED ORDER — ZOLEDRONIC ACID 4 MG/100ML IV SOLN
4.0000 mg | Freq: Once | INTRAVENOUS | Status: AC
  Administered 2020-06-26: 4 mg via INTRAVENOUS
  Filled 2020-06-26: qty 100

## 2020-06-26 MED ORDER — SODIUM CHLORIDE 0.9 % IV SOLN
Freq: Once | INTRAVENOUS | Status: AC
  Filled 2020-06-26: qty 250

## 2020-06-26 NOTE — Telephone Encounter (Signed)
Appointments scheduled and patient requested to get updates from My Chart per 4/7 los

## 2020-06-26 NOTE — Addendum Note (Signed)
Addended by: Owens Shark on: 06/26/2020 01:05 PM   Modules accepted: Orders
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
## 2020-06-27 ENCOUNTER — Other Ambulatory Visit: Payer: Self-pay | Admitting: *Deleted

## 2020-06-27 MED ORDER — LENALIDOMIDE 10 MG PO CAPS: 10.0000 mg | ORAL_CAPSULE | Freq: Every day | ORAL | 0 refills | Status: DC

## 2020-06-27 NOTE — Telephone Encounter (Signed)
Received faxed refill request for Revlimid.

## 2020-07-04 ENCOUNTER — Telehealth: Payer: Self-pay | Admitting: *Deleted

## 2020-07-04 ENCOUNTER — Other Ambulatory Visit: Payer: Self-pay

## 2020-07-04 ENCOUNTER — Inpatient Hospital Stay: Payer: BC Managed Care – PPO

## 2020-07-04 DIAGNOSIS — C9000 Multiple myeloma not having achieved remission: Secondary | ICD-10-CM | POA: Diagnosis not present

## 2020-07-04 LAB — CBC WITH DIFFERENTIAL (CANCER CENTER ONLY)
Abs Immature Granulocytes: 0.02 10*3/uL (ref 0.00–0.07)
Basophils Absolute: 0.1 10*3/uL (ref 0.0–0.1)
Basophils Relative: 2 %
Eosinophils Absolute: 0 10*3/uL (ref 0.0–0.5)
Eosinophils Relative: 1 %
HCT: 41.8 % (ref 39.0–52.0)
Hemoglobin: 13.8 g/dL (ref 13.0–17.0)
Immature Granulocytes: 1 %
Lymphocytes Relative: 36 %
Lymphs Abs: 1.5 10*3/uL (ref 0.7–4.0)
MCH: 26.2 pg (ref 26.0–34.0)
MCHC: 33 g/dL (ref 30.0–36.0)
MCV: 79.5 fL — ABNORMAL LOW (ref 80.0–100.0)
Monocytes Absolute: 0.7 10*3/uL (ref 0.1–1.0)
Monocytes Relative: 17 %
Neutro Abs: 1.8 10*3/uL (ref 1.7–7.7)
Neutrophils Relative %: 43 %
Platelet Count: 257 10*3/uL (ref 150–400)
RBC: 5.26 MIL/uL (ref 4.22–5.81)
RDW: 14.9 % (ref 11.5–15.5)
WBC Count: 4.1 10*3/uL (ref 4.0–10.5)
nRBC: 0 % (ref 0.0–0.2)

## 2020-07-04 NOTE — Telephone Encounter (Signed)
-----   Message from Owens Shark, NP sent at 07/04/2020 11:43 AM EDT ----- Please let him know the CBC from today looks good.  He can begin the next cycle of Revlimid. ----- Message ----- From: Interface, Lab In [place] Sent: 07/04/2020  11:31 AM EDT To: Owens Shark, NP

## 2020-07-04 NOTE — Telephone Encounter (Signed)
Opened in error

## 2020-07-04 NOTE — Telephone Encounter (Signed)
Notified of normal CBC results and OK to resume Revlimid.

## 2020-07-14 ENCOUNTER — Encounter: Payer: Self-pay | Admitting: Nurse Practitioner

## 2020-07-14 ENCOUNTER — Other Ambulatory Visit: Payer: Self-pay

## 2020-07-14 DIAGNOSIS — C9000 Multiple myeloma not having achieved remission: Secondary | ICD-10-CM

## 2020-07-14 MED ORDER — VITAMIN D (ERGOCALCIFEROL) 1.25 MG (50000 UNIT) PO CAPS: 1.0000 | ORAL_CAPSULE | ORAL | 2 refills | Status: DC

## 2020-07-14 NOTE — Progress Notes (Signed)
Pt requested refill: Flexiril  Request sent to provider for approval

## 2020-07-18 ENCOUNTER — Other Ambulatory Visit: Payer: Self-pay

## 2020-07-18 DIAGNOSIS — C9000 Multiple myeloma not having achieved remission: Secondary | ICD-10-CM

## 2020-07-18 MED ORDER — CYCLOBENZAPRINE HCL 5 MG PO TABS: 5.0000 mg | ORAL_TABLET | Freq: Three times a day (TID) | ORAL | 0 refills | Status: DC | PRN

## 2020-07-18 NOTE — Progress Notes (Signed)
Medication review

## 2020-07-23 ENCOUNTER — Other Ambulatory Visit: Payer: Self-pay | Admitting: Oncology

## 2020-07-28 ENCOUNTER — Other Ambulatory Visit: Payer: Self-pay

## 2020-07-28 ENCOUNTER — Inpatient Hospital Stay: Payer: BC Managed Care – PPO | Attending: Oncology | Admitting: Oncology

## 2020-07-28 ENCOUNTER — Other Ambulatory Visit: Payer: Self-pay | Admitting: *Deleted

## 2020-07-28 ENCOUNTER — Inpatient Hospital Stay: Payer: BC Managed Care – PPO

## 2020-07-28 VITALS — BP 139/97 | HR 103 | Temp 98.0°F | Resp 20 | Ht 73.0 in | Wt 248.0 lb

## 2020-07-28 DIAGNOSIS — Z79899 Other long term (current) drug therapy: Secondary | ICD-10-CM | POA: Insufficient documentation

## 2020-07-28 DIAGNOSIS — C9000 Multiple myeloma not having achieved remission: Secondary | ICD-10-CM | POA: Diagnosis not present

## 2020-07-28 DIAGNOSIS — I10 Essential (primary) hypertension: Secondary | ICD-10-CM | POA: Diagnosis not present

## 2020-07-28 DIAGNOSIS — R21 Rash and other nonspecific skin eruption: Secondary | ICD-10-CM | POA: Insufficient documentation

## 2020-07-28 DIAGNOSIS — Z86718 Personal history of other venous thrombosis and embolism: Secondary | ICD-10-CM | POA: Diagnosis not present

## 2020-07-28 DIAGNOSIS — R5381 Other malaise: Secondary | ICD-10-CM | POA: Insufficient documentation

## 2020-07-28 DIAGNOSIS — D649 Anemia, unspecified: Secondary | ICD-10-CM | POA: Diagnosis not present

## 2020-07-28 LAB — CBC WITH DIFFERENTIAL (CANCER CENTER ONLY)
Abs Immature Granulocytes: 0.01 10*3/uL (ref 0.00–0.07)
Basophils Absolute: 0.1 10*3/uL (ref 0.0–0.1)
Basophils Relative: 2 %
Eosinophils Absolute: 0.3 10*3/uL (ref 0.0–0.5)
Eosinophils Relative: 6 %
HCT: 42 % (ref 39.0–52.0)
Hemoglobin: 14 g/dL (ref 13.0–17.0)
Immature Granulocytes: 0 %
Lymphocytes Relative: 40 %
Lymphs Abs: 1.8 10*3/uL (ref 0.7–4.0)
MCH: 26.2 pg (ref 26.0–34.0)
MCHC: 33.3 g/dL (ref 30.0–36.0)
MCV: 78.5 fL — ABNORMAL LOW (ref 80.0–100.0)
Monocytes Absolute: 0.5 10*3/uL (ref 0.1–1.0)
Monocytes Relative: 10 %
Neutro Abs: 1.9 10*3/uL (ref 1.7–7.7)
Neutrophils Relative %: 42 %
Platelet Count: 240 10*3/uL (ref 150–400)
RBC: 5.35 MIL/uL (ref 4.22–5.81)
RDW: 14.9 % (ref 11.5–15.5)
WBC Count: 4.5 10*3/uL (ref 4.0–10.5)
nRBC: 0 % (ref 0.0–0.2)

## 2020-07-28 LAB — CMP (CANCER CENTER ONLY)
ALT: 33 U/L (ref 0–44)
AST: 17 U/L (ref 15–41)
Albumin: 4.6 g/dL (ref 3.5–5.0)
Alkaline Phosphatase: 57 U/L (ref 38–126)
Anion gap: 11 (ref 5–15)
BUN: 10 mg/dL (ref 6–20)
CO2: 24 mmol/L (ref 22–32)
Calcium: 9.2 mg/dL (ref 8.9–10.3)
Chloride: 107 mmol/L (ref 98–111)
Creatinine: 1.35 mg/dL — ABNORMAL HIGH (ref 0.61–1.24)
GFR, Estimated: >60 mL/min (ref >60)
Glucose, Bld: 106 mg/dL — ABNORMAL HIGH (ref 70–99)
Potassium: 3.5 mmol/L (ref 3.5–5.1)
Sodium: 142 mmol/L (ref 135–145)
Total Bilirubin: 0.4 mg/dL (ref 0.3–1.2)
Total Protein: 7.2 g/dL (ref 6.5–8.1)

## 2020-07-28 MED ORDER — CYCLOBENZAPRINE HCL 5 MG PO TABS: 5.0000 mg | ORAL_TABLET | Freq: Three times a day (TID) | ORAL | 0 refills | Status: DC | PRN

## 2020-07-28 MED ORDER — ACYCLOVIR 800 MG PO TABS: 800.0000 mg | ORAL_TABLET | Freq: Two times a day (BID) | ORAL | 2 refills | Status: DC

## 2020-07-28 MED ORDER — AMLODIPINE BESYLATE 5 MG PO TABS: ORAL_TABLET | ORAL | 1 refills | Status: DC

## 2020-07-28 MED ORDER — METOPROLOL TARTRATE 25 MG PO TABS: ORAL_TABLET | ORAL | 1 refills | Status: DC
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
## 2020-07-30 LAB — PROTEIN ELECTROPHORESIS, SERUM
A/G Ratio: 1.3 (ref 0.7–1.7)
Albumin ELP: 3.9 g/dL (ref 2.9–4.4)
Alpha-1-Globulin: 0.2 g/dL (ref 0.0–0.4)
Alpha-2-Globulin: 0.9 g/dL (ref 0.4–1.0)
Beta Globulin: 1.2 g/dL (ref 0.7–1.3)
Gamma Globulin: 0.8 g/dL (ref 0.4–1.8)
Globulin, Total: 3 g/dL (ref 2.2–3.9)
Total Protein ELP: 6.9 g/dL (ref 6.0–8.5)

## 2020-07-30 LAB — KAPPA/LAMBDA LIGHT CHAINS
Kappa free light chain: 22.8 mg/L — ABNORMAL HIGH (ref 3.3–19.4)
Kappa, lambda light chain ratio: 1.84 — ABNORMAL HIGH (ref 0.26–1.65)
Lambda free light chains: 12.4 mg/L (ref 5.7–26.3)

## 2020-08-21 ENCOUNTER — Other Ambulatory Visit: Payer: Self-pay | Admitting: Oncology

## 2020-08-28 ENCOUNTER — Inpatient Hospital Stay: Payer: BC Managed Care – PPO

## 2020-08-28 ENCOUNTER — Telehealth: Payer: Self-pay | Admitting: *Deleted

## 2020-08-28 ENCOUNTER — Other Ambulatory Visit: Payer: Self-pay

## 2020-08-28 ENCOUNTER — Inpatient Hospital Stay: Payer: BC Managed Care – PPO | Attending: Oncology | Admitting: Nurse Practitioner

## 2020-08-28 ENCOUNTER — Encounter: Payer: Self-pay | Admitting: Nurse Practitioner

## 2020-08-28 VITALS — BP 135/95 | HR 75 | Temp 98.1°F | Resp 18 | Ht 73.0 in | Wt 243.0 lb

## 2020-08-28 DIAGNOSIS — I10 Essential (primary) hypertension: Secondary | ICD-10-CM | POA: Diagnosis not present

## 2020-08-28 DIAGNOSIS — Z86718 Personal history of other venous thrombosis and embolism: Secondary | ICD-10-CM | POA: Diagnosis not present

## 2020-08-28 DIAGNOSIS — D649 Anemia, unspecified: Secondary | ICD-10-CM | POA: Insufficient documentation

## 2020-08-28 DIAGNOSIS — Z7901 Long term (current) use of anticoagulants: Secondary | ICD-10-CM | POA: Diagnosis not present

## 2020-08-28 DIAGNOSIS — R197 Diarrhea, unspecified: Secondary | ICD-10-CM | POA: Diagnosis not present

## 2020-08-28 DIAGNOSIS — G8929 Other chronic pain: Secondary | ICD-10-CM | POA: Diagnosis not present

## 2020-08-28 DIAGNOSIS — R5381 Other malaise: Secondary | ICD-10-CM | POA: Insufficient documentation

## 2020-08-28 DIAGNOSIS — Z79899 Other long term (current) drug therapy: Secondary | ICD-10-CM | POA: Insufficient documentation

## 2020-08-28 DIAGNOSIS — C9000 Multiple myeloma not having achieved remission: Secondary | ICD-10-CM | POA: Diagnosis not present

## 2020-08-28 DIAGNOSIS — M549 Dorsalgia, unspecified: Secondary | ICD-10-CM | POA: Diagnosis not present

## 2020-08-28 LAB — CMP (CANCER CENTER ONLY)
ALT: 36 U/L (ref 0–44)
AST: 19 U/L (ref 15–41)
Albumin: 4.3 g/dL (ref 3.5–5.0)
Alkaline Phosphatase: 66 U/L (ref 38–126)
Anion gap: 10 (ref 5–15)
BUN: 10 mg/dL (ref 6–20)
CO2: 22 mmol/L (ref 22–32)
Calcium: 9.5 mg/dL (ref 8.9–10.3)
Chloride: 108 mmol/L (ref 98–111)
Creatinine: 1.3 mg/dL — ABNORMAL HIGH (ref 0.61–1.24)
GFR, Estimated: >60 mL/min (ref >60)
Glucose, Bld: 98 mg/dL (ref 70–99)
Potassium: 3.6 mmol/L (ref 3.5–5.1)
Sodium: 140 mmol/L (ref 135–145)
Total Bilirubin: 0.4 mg/dL (ref 0.3–1.2)
Total Protein: 7.3 g/dL (ref 6.5–8.1)

## 2020-08-28 LAB — CBC WITH DIFFERENTIAL (CANCER CENTER ONLY)
Abs Immature Granulocytes: 0 10*3/uL (ref 0.00–0.07)
Basophils Absolute: 0.1 10*3/uL (ref 0.0–0.1)
Basophils Relative: 3 %
Eosinophils Absolute: 0.1 10*3/uL (ref 0.0–0.5)
Eosinophils Relative: 2 %
HCT: 43.5 % (ref 39.0–52.0)
Hemoglobin: 14.5 g/dL (ref 13.0–17.0)
Immature Granulocytes: 0 %
Lymphocytes Relative: 44 %
Lymphs Abs: 1.9 10*3/uL (ref 0.7–4.0)
MCH: 26.1 pg (ref 26.0–34.0)
MCHC: 33.3 g/dL (ref 30.0–36.0)
MCV: 78.4 fL — ABNORMAL LOW (ref 80.0–100.0)
Monocytes Absolute: 0.4 10*3/uL (ref 0.1–1.0)
Monocytes Relative: 10 %
Neutro Abs: 1.8 10*3/uL (ref 1.7–7.7)
Neutrophils Relative %: 41 %
Platelet Count: 273 10*3/uL (ref 150–400)
RBC: 5.55 MIL/uL (ref 4.22–5.81)
RDW: 14.6 % (ref 11.5–15.5)
WBC Count: 4.3 10*3/uL (ref 4.0–10.5)
nRBC: 0 % (ref 0.0–0.2)

## 2020-08-28 MED ORDER — CYCLOBENZAPRINE HCL 5 MG PO TABS: 5.0000 mg | ORAL_TABLET | Freq: Three times a day (TID) | ORAL | 0 refills | Status: DC | PRN

## 2020-08-28 NOTE — Telephone Encounter (Signed)
Left VM w/[facility] [phone] to inquire on status of referral placed in March 2022. Left direct number for return call.

## 2020-08-28 NOTE — Telephone Encounter (Signed)
Return call from [facility] that they called patient on 07/01/20 at 10:12 am and there was no answer. They also mailed him a letter. Plan to reach out to him again this month and request that he be told to answer his phone. Provided this info to Derek [name] via [place] and provided phone # for him to call himself.
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
## 2020-08-29 ENCOUNTER — Encounter: Payer: Self-pay | Admitting: *Deleted

## 2020-08-29 LAB — KAPPA/LAMBDA LIGHT CHAINS
Kappa free light chain: 26.9 mg/L — ABNORMAL HIGH (ref 3.3–19.4)
Kappa, lambda light chain ratio: 1.99 — ABNORMAL HIGH (ref 0.26–1.65)
Lambda free light chains: 13.5 mg/L (ref 5.7–26.3)

## 2020-08-29 LAB — PROTEIN ELECTROPHORESIS, SERUM
A/G Ratio: 1.3 (ref 0.7–1.7)
Albumin ELP: 3.9 g/dL (ref 2.9–4.4)
Alpha-1-Globulin: 0.2 g/dL (ref 0.0–0.4)
Alpha-2-Globulin: 0.8 g/dL (ref 0.4–1.0)
Beta Globulin: 1.1 g/dL (ref 0.7–1.3)
Gamma Globulin: 0.9 g/dL (ref 0.4–1.8)
Globulin, Total: 2.9 g/dL (ref 2.2–3.9)
Total Protein ELP: 6.8 g/dL (ref 6.0–8.5)

## 2020-08-29 NOTE — Progress Notes (Signed)
Received fax from [facility] that they have made multiple attempts to reach patient for referral. [facility] to confirm they still have not been able to reach him. Confirmed this with Rosalio Loud at [facility]. Sent MyChart message to patient and provided phone # to call to schedule his appointment.

## 2020-09-10 ENCOUNTER — Other Ambulatory Visit: Payer: Self-pay | Admitting: Oncology

## 2020-09-10 DIAGNOSIS — C9000 Multiple myeloma not having achieved remission: Secondary | ICD-10-CM

## 2020-09-16 ENCOUNTER — Other Ambulatory Visit: Payer: Self-pay | Admitting: Oncology

## 2020-09-25 ENCOUNTER — Inpatient Hospital Stay: Payer: BC Managed Care – PPO

## 2020-09-25 ENCOUNTER — Other Ambulatory Visit: Payer: Self-pay

## 2020-09-25 ENCOUNTER — Inpatient Hospital Stay (HOSPITAL_BASED_OUTPATIENT_CLINIC_OR_DEPARTMENT_OTHER): Payer: BC Managed Care – PPO | Admitting: Oncology

## 2020-09-25 ENCOUNTER — Inpatient Hospital Stay: Payer: BC Managed Care – PPO | Attending: Oncology

## 2020-09-25 VITALS — BP 136/92 | HR 79 | Temp 97.8°F | Resp 20 | Ht 73.0 in | Wt 241.8 lb

## 2020-09-25 DIAGNOSIS — D649 Anemia, unspecified: Secondary | ICD-10-CM | POA: Insufficient documentation

## 2020-09-25 DIAGNOSIS — Z79899 Other long term (current) drug therapy: Secondary | ICD-10-CM | POA: Insufficient documentation

## 2020-09-25 DIAGNOSIS — I10 Essential (primary) hypertension: Secondary | ICD-10-CM | POA: Insufficient documentation

## 2020-09-25 DIAGNOSIS — C9000 Multiple myeloma not having achieved remission: Secondary | ICD-10-CM

## 2020-09-25 DIAGNOSIS — Z86718 Personal history of other venous thrombosis and embolism: Secondary | ICD-10-CM | POA: Diagnosis not present

## 2020-09-25 DIAGNOSIS — Z7901 Long term (current) use of anticoagulants: Secondary | ICD-10-CM | POA: Insufficient documentation

## 2020-09-25 LAB — CBC WITH DIFFERENTIAL (CANCER CENTER ONLY)
Abs Immature Granulocytes: 0.01 10*3/uL (ref 0.00–0.07)
Basophils Absolute: 0.1 10*3/uL (ref 0.0–0.1)
Basophils Relative: 2 %
Eosinophils Absolute: 0.1 10*3/uL (ref 0.0–0.5)
Eosinophils Relative: 3 %
HCT: 41.7 % (ref 39.0–52.0)
Hemoglobin: 13.9 g/dL (ref 13.0–17.0)
Immature Granulocytes: 0 %
Lymphocytes Relative: 38 %
Lymphs Abs: 1.5 10*3/uL (ref 0.7–4.0)
MCH: 26.3 pg (ref 26.0–34.0)
MCHC: 33.3 g/dL (ref 30.0–36.0)
MCV: 78.8 fL — ABNORMAL LOW (ref 80.0–100.0)
Monocytes Absolute: 0.4 10*3/uL (ref 0.1–1.0)
Monocytes Relative: 10 %
Neutro Abs: 1.8 10*3/uL (ref 1.7–7.7)
Neutrophils Relative %: 47 %
Platelet Count: 227 10*3/uL (ref 150–400)
RBC: 5.29 MIL/uL (ref 4.22–5.81)
RDW: 14.4 % (ref 11.5–15.5)
WBC Count: 3.8 10*3/uL — ABNORMAL LOW (ref 4.0–10.5)
nRBC: 0 % (ref 0.0–0.2)

## 2020-09-25 LAB — CMP (CANCER CENTER ONLY)
ALT: 40 U/L (ref 0–44)
AST: 20 U/L (ref 15–41)
Albumin: 4.2 g/dL (ref 3.5–5.0)
Alkaline Phosphatase: 67 U/L (ref 38–126)
Anion gap: 8 (ref 5–15)
BUN: 12 mg/dL (ref 6–20)
CO2: 23 mmol/L (ref 22–32)
Calcium: 9.4 mg/dL (ref 8.9–10.3)
Chloride: 108 mmol/L (ref 98–111)
Creatinine: 1.26 mg/dL — ABNORMAL HIGH (ref 0.61–1.24)
GFR, Estimated: >60 mL/min (ref >60)
Glucose, Bld: 118 mg/dL — ABNORMAL HIGH (ref 70–99)
Potassium: 3.9 mmol/L (ref 3.5–5.1)
Sodium: 139 mmol/L (ref 135–145)
Total Bilirubin: 0.5 mg/dL (ref 0.3–1.2)
Total Protein: 7.2 g/dL (ref 6.5–8.1)

## 2020-09-25 MED ORDER — VITAMIN D (ERGOCALCIFEROL) 1.25 MG (50000 UNIT) PO CAPS: 50000.0000 [IU] | ORAL_CAPSULE | ORAL | 2 refills | Status: DC

## 2020-09-25 NOTE — Progress Notes (Unsigned)
Vitamin D refill
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
## 2020-09-26 ENCOUNTER — Other Ambulatory Visit: Payer: Self-pay | Admitting: Nurse Practitioner

## 2020-09-26 ENCOUNTER — Inpatient Hospital Stay: Payer: BC Managed Care – PPO

## 2020-09-26 VITALS — BP 148/91 | HR 78 | Temp 97.6°F | Resp 18

## 2020-09-26 DIAGNOSIS — C9000 Multiple myeloma not having achieved remission: Secondary | ICD-10-CM | POA: Diagnosis not present

## 2020-09-26 MED ORDER — ZOLEDRONIC ACID 4 MG/100ML IV SOLN
4.0000 mg | Freq: Once | INTRAVENOUS | Status: AC
  Administered 2020-09-26: 4 mg via INTRAVENOUS
  Filled 2020-09-26: qty 100

## 2020-09-26 MED ORDER — SODIUM CHLORIDE 0.9 % IV SOLN
Freq: Once | INTRAVENOUS | Status: AC
Start: 2020-09-26 — End: 2020-09-26
  Filled 2020-09-26: qty 250

## 2020-09-26 NOTE — Patient Instructions (Signed)

## 2020-09-28 LAB — KAPPA/LAMBDA LIGHT CHAINS
Kappa free light chain: 24.1 mg/L — ABNORMAL HIGH (ref 3.3–19.4)
Kappa, lambda light chain ratio: 1.67 — ABNORMAL HIGH (ref 0.26–1.65)
Lambda free light chains: 14.4 mg/L (ref 5.7–26.3)

## 2020-09-29 LAB — PROTEIN ELECTROPHORESIS, SERUM
A/G Ratio: 1.4 (ref 0.7–1.7)
Albumin ELP: 3.8 g/dL (ref 2.9–4.4)
Alpha-1-Globulin: 0.2 g/dL (ref 0.0–0.4)
Alpha-2-Globulin: 0.7 g/dL (ref 0.4–1.0)
Beta Globulin: 1 g/dL (ref 0.7–1.3)
Gamma Globulin: 0.8 g/dL (ref 0.4–1.8)
Globulin, Total: 2.7 g/dL (ref 2.2–3.9)
M-Spike, %: 0.2 g/dL — ABNORMAL HIGH
Total Protein ELP: 6.5 g/dL (ref 6.0–8.5)

## 2020-10-13 ENCOUNTER — Encounter: Payer: Self-pay | Admitting: *Deleted

## 2020-10-13 NOTE — Progress Notes (Signed)
[facility] to f/u on referral placed by this office on 06/26/20. Was informed their office has reached out to him x 2 with no return call. Notified patient via MyChart and provided [facility] phone # to call to make appointment.

## 2020-10-15 ENCOUNTER — Other Ambulatory Visit: Payer: Self-pay | Admitting: Oncology

## 2020-10-27 ENCOUNTER — Inpatient Hospital Stay (HOSPITAL_BASED_OUTPATIENT_CLINIC_OR_DEPARTMENT_OTHER): Payer: BC Managed Care – PPO | Admitting: Nurse Practitioner

## 2020-10-27 ENCOUNTER — Inpatient Hospital Stay: Payer: BC Managed Care – PPO | Attending: Oncology

## 2020-10-27 ENCOUNTER — Encounter: Payer: Self-pay | Admitting: Nurse Practitioner

## 2020-10-27 ENCOUNTER — Other Ambulatory Visit: Payer: Self-pay

## 2020-10-27 ENCOUNTER — Inpatient Hospital Stay: Payer: BC Managed Care – PPO

## 2020-10-27 VITALS — BP 133/95 | HR 96 | Temp 98.2°F | Resp 18 | Ht 73.0 in | Wt 242.6 lb

## 2020-10-27 DIAGNOSIS — Z86718 Personal history of other venous thrombosis and embolism: Secondary | ICD-10-CM | POA: Diagnosis not present

## 2020-10-27 DIAGNOSIS — C9000 Multiple myeloma not having achieved remission: Secondary | ICD-10-CM

## 2020-10-27 DIAGNOSIS — Z79899 Other long term (current) drug therapy: Secondary | ICD-10-CM | POA: Diagnosis not present

## 2020-10-27 DIAGNOSIS — I10 Essential (primary) hypertension: Secondary | ICD-10-CM | POA: Diagnosis not present

## 2020-10-27 DIAGNOSIS — D649 Anemia, unspecified: Secondary | ICD-10-CM | POA: Insufficient documentation

## 2020-10-27 LAB — CBC WITH DIFFERENTIAL (CANCER CENTER ONLY)
Abs Immature Granulocytes: 0 10*3/uL (ref 0.00–0.07)
Basophils Absolute: 0.1 10*3/uL (ref 0.0–0.1)
Basophils Relative: 4 %
Eosinophils Absolute: 0.1 10*3/uL (ref 0.0–0.5)
Eosinophils Relative: 2 %
HCT: 41.3 % (ref 39.0–52.0)
Hemoglobin: 13.5 g/dL (ref 13.0–17.0)
Immature Granulocytes: 0 %
Lymphocytes Relative: 45 %
Lymphs Abs: 1.7 10*3/uL (ref 0.7–4.0)
MCH: 25.8 pg — ABNORMAL LOW (ref 26.0–34.0)
MCHC: 32.7 g/dL (ref 30.0–36.0)
MCV: 78.8 fL — ABNORMAL LOW (ref 80.0–100.0)
Monocytes Absolute: 0.5 10*3/uL (ref 0.1–1.0)
Monocytes Relative: 15 %
Neutro Abs: 1.3 10*3/uL — ABNORMAL LOW (ref 1.7–7.7)
Neutrophils Relative %: 34 %
Platelet Count: 243 10*3/uL (ref 150–400)
RBC: 5.24 MIL/uL (ref 4.22–5.81)
RDW: 14.9 % (ref 11.5–15.5)
WBC Count: 3.7 10*3/uL — ABNORMAL LOW (ref 4.0–10.5)
nRBC: 0 % (ref 0.0–0.2)

## 2020-10-27 LAB — CMP (CANCER CENTER ONLY)
ALT: 23 U/L (ref 0–44)
AST: 41 U/L (ref 15–41)
Albumin: 4 g/dL (ref 3.5–5.0)
Alkaline Phosphatase: 55 U/L (ref 38–126)
Anion gap: 9 (ref 5–15)
BUN: 10 mg/dL (ref 6–20)
CO2: 24 mmol/L (ref 22–32)
Calcium: 9 mg/dL (ref 8.9–10.3)
Chloride: 107 mmol/L (ref 98–111)
Creatinine: 1.35 mg/dL — ABNORMAL HIGH (ref 0.61–1.24)
GFR, Estimated: >60 mL/min (ref >60)
Glucose, Bld: 103 mg/dL — ABNORMAL HIGH (ref 70–99)
Potassium: 3.7 mmol/L (ref 3.5–5.1)
Sodium: 140 mmol/L (ref 135–145)
Total Bilirubin: 0.4 mg/dL (ref 0.3–1.2)
Total Protein: 6.9 g/dL (ref 6.5–8.1)
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
## 2020-10-28 ENCOUNTER — Encounter: Payer: Self-pay | Admitting: *Deleted

## 2020-10-28 LAB — KAPPA/LAMBDA LIGHT CHAINS
Kappa free light chain: 21.1 mg/L — ABNORMAL HIGH (ref 3.3–19.4)
Kappa, lambda light chain ratio: 1.6 (ref 0.26–1.65)
Lambda free light chains: 13.2 mg/L (ref 5.7–26.3)

## 2020-10-28 NOTE — Progress Notes (Signed)
Faxed completed form to [employer] for assistance w/Revlimid cost. Copy to HIM. Fax [phone]

## 2020-11-12 ENCOUNTER — Encounter: Payer: Self-pay | Admitting: Oncology

## 2020-11-17 ENCOUNTER — Other Ambulatory Visit: Payer: Self-pay | Admitting: Oncology

## 2020-11-18 ENCOUNTER — Encounter: Payer: Self-pay | Admitting: Nurse Practitioner

## 2020-11-18 ENCOUNTER — Encounter: Payer: Self-pay | Admitting: *Deleted

## 2020-11-18 NOTE — Progress Notes (Signed)
Patient has presented forms to continue his STD from employer. MD will keep him out until his PT is completed due to weakness and lumbar back pain. Physical therapy will see him on 11/20/20 for re-evaluation/assessment and reach out to office with plans.

## 2020-11-19 ENCOUNTER — Encounter: Payer: Self-pay | Admitting: *Deleted

## 2020-11-19 NOTE — Progress Notes (Signed)
Faxed STD statement to HR department att: Juanito Doom at [phone] as requested. Copy to HIM to scan and will provide patient copy at next appointment. Will be able to determine return to work date after physical therapy evaluation on 11/20/20.

## 2020-11-19 NOTE — Telephone Encounter (Signed)
Patient called to report that HR has extended his leave another 30 days. Will discuss plans at office visit on 9/6 with Dr. Benay Spice. 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 Work is able to accommodate his lifting, but he requires frequent rest periods due to fatigue. They did mention there was a position that he could do remotely for them as a potential.

## 2020-11-21 ENCOUNTER — Encounter: Payer: Self-pay | Admitting: *Deleted

## 2020-11-21 NOTE — Progress Notes (Signed)
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 He will continue twice weekly PT till end of November. In discussion with patient, he should be able to manage returning to work on remote access duties.

## 2020-11-25 ENCOUNTER — Other Ambulatory Visit: Payer: Self-pay

## 2020-11-25 ENCOUNTER — Inpatient Hospital Stay: Payer: BC Managed Care – PPO

## 2020-11-25 ENCOUNTER — Encounter: Payer: Self-pay | Admitting: Nurse Practitioner

## 2020-11-25 ENCOUNTER — Inpatient Hospital Stay: Payer: BC Managed Care – PPO | Attending: Oncology | Admitting: Nurse Practitioner

## 2020-11-25 VITALS — BP 142/93 | HR 72 | Temp 97.8°F | Resp 20 | Ht 73.0 in | Wt 241.6 lb

## 2020-11-25 DIAGNOSIS — C9000 Multiple myeloma not having achieved remission: Secondary | ICD-10-CM | POA: Diagnosis not present

## 2020-11-25 LAB — CBC WITH DIFFERENTIAL (CANCER CENTER ONLY)
Abs Immature Granulocytes: 0.01 K/uL (ref 0.00–0.07)
Basophils Absolute: 0.1 K/uL (ref 0.0–0.1)
Basophils Relative: 3 %
Eosinophils Absolute: 0.1 K/uL (ref 0.0–0.5)
Eosinophils Relative: 2 %
HCT: 42.3 % (ref 39.0–52.0)
Hemoglobin: 13.8 g/dL (ref 13.0–17.0)
Immature Granulocytes: 0 %
Lymphocytes Relative: 33 %
Lymphs Abs: 1.5 K/uL (ref 0.7–4.0)
MCH: 25.6 pg — ABNORMAL LOW (ref 26.0–34.0)
MCHC: 32.6 g/dL (ref 30.0–36.0)
MCV: 78.5 fL — ABNORMAL LOW (ref 80.0–100.0)
Monocytes Absolute: 0.5 K/uL (ref 0.1–1.0)
Monocytes Relative: 12 %
Neutro Abs: 2.2 K/uL (ref 1.7–7.7)
Neutrophils Relative %: 50 %
Platelet Count: 264 K/uL (ref 150–400)
RBC: 5.39 MIL/uL (ref 4.22–5.81)
RDW: 15 % (ref 11.5–15.5)
WBC Count: 4.5 K/uL (ref 4.0–10.5)
nRBC: 0 % (ref 0.0–0.2)

## 2020-11-25 LAB — CMP (CANCER CENTER ONLY)
ALT: 23 U/L (ref 0–44)
AST: 16 U/L (ref 15–41)
Albumin: 4.5 g/dL (ref 3.5–5.0)
Alkaline Phosphatase: 58 U/L (ref 38–126)
Anion gap: 9 (ref 5–15)
BUN: 14 mg/dL (ref 6–20)
CO2: 24 mmol/L (ref 22–32)
Calcium: 9.2 mg/dL (ref 8.9–10.3)
Chloride: 108 mmol/L (ref 98–111)
Creatinine: 1.13 mg/dL (ref 0.61–1.24)
GFR, Estimated: >60 mL/min (ref >60)
Glucose, Bld: 91 mg/dL (ref 70–99)
Potassium: 3.9 mmol/L (ref 3.5–5.1)
Sodium: 141 mmol/L (ref 135–145)
Total Bilirubin: 0.4 mg/dL (ref 0.3–1.2)
Total Protein: 7.1 g/dL (ref 6.5–8.1)

## 2020-11-25 MED ORDER — HYDROMORPHONE HCL 4 MG PO TABS: 4.0000 mg | ORAL_TABLET | ORAL | 0 refills | Status: DC | PRN
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
## 2020-11-25 NOTE — Addendum Note (Signed)
Addended by: Owens Shark on: 11/25/2020 12:46 PM   Modules accepted: Orders

## 2020-11-26 ENCOUNTER — Encounter: Payer: Self-pay | Admitting: *Deleted

## 2020-11-26 LAB — KAPPA/LAMBDA LIGHT CHAINS
Kappa free light chain: 24.8 mg/L — ABNORMAL HIGH (ref 3.3–19.4)
Kappa, lambda light chain ratio: 1.64 (ref 0.26–1.65)
Lambda free light chains: 15.1 mg/L (ref 5.7–26.3)

## 2020-11-26 NOTE — Progress Notes (Signed)
Faxed updated form regarding return to work and limitations to his LOA Case Manager, Juanito Doom at [phone]. Per Dr. Benay Spice, clear to return on 9/12 by working remotely. 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 Copy to HIM to scan

## 2020-11-27 LAB — PROTEIN ELECTROPHORESIS, SERUM
A/G Ratio: 1.4 (ref 0.7–1.7)
Albumin ELP: 3.8 g/dL (ref 2.9–4.4)
Alpha-1-Globulin: 0.2 g/dL (ref 0.0–0.4)
Alpha-2-Globulin: 0.8 g/dL (ref 0.4–1.0)
Beta Globulin: 1.1 g/dL (ref 0.7–1.3)
Gamma Globulin: 0.7 g/dL (ref 0.4–1.8)
Globulin, Total: 2.8 g/dL (ref 2.2–3.9)
Total Protein ELP: 6.6 g/dL (ref 6.0–8.5)

## 2020-12-04 ENCOUNTER — Encounter: Payer: Self-pay | Admitting: *Deleted

## 2020-12-04 ENCOUNTER — Other Ambulatory Visit: Payer: Self-pay | Admitting: Nurse Practitioner

## 2020-12-04 DIAGNOSIS — C9000 Multiple myeloma not having achieved remission: Secondary | ICD-10-CM

## 2020-12-04 NOTE — Progress Notes (Signed)
Received a new form from employer per Juanito Doom to complete regarding return to work and restrictions. Form completed, signed by MD and faxed back to [phone]. Copy to HIM and patient will be provided copy at next appointment.

## 2020-12-08 ENCOUNTER — Encounter: Payer: Self-pay | Admitting: Nurse Practitioner

## 2020-12-13 ENCOUNTER — Other Ambulatory Visit: Payer: Self-pay | Admitting: Oncology

## 2020-12-13 DIAGNOSIS — C9000 Multiple myeloma not having achieved remission: Secondary | ICD-10-CM

## 2020-12-15 ENCOUNTER — Other Ambulatory Visit: Payer: Self-pay

## 2020-12-15 ENCOUNTER — Other Ambulatory Visit (HOSPITAL_COMMUNITY): Payer: Self-pay

## 2020-12-15 ENCOUNTER — Encounter: Payer: Self-pay | Admitting: Nurse Practitioner

## 2020-12-15 DIAGNOSIS — C9000 Multiple myeloma not having achieved remission: Secondary | ICD-10-CM

## 2020-12-15 MED ORDER — VITAMIN D (ERGOCALCIFEROL) 1.25 MG (50000 UNIT) PO CAPS
50000.0000 [IU] | ORAL_CAPSULE | ORAL | 2 refills | Status: DC
  Filled 2020-12-15: qty 4, 28d supply, fill #0

## 2020-12-15 MED ORDER — APIXABAN 5 MG PO TABS
5.0000 mg | ORAL_TABLET | Freq: Two times a day (BID) | ORAL | 5 refills | Status: DC
  Filled 2020-12-15: qty 60, 30d supply, fill #0

## 2020-12-15 NOTE — Progress Notes (Signed)
Eliquis vitamin reorder

## 2020-12-17 ENCOUNTER — Other Ambulatory Visit: Payer: Self-pay | Admitting: Oncology

## 2020-12-18 ENCOUNTER — Encounter: Payer: Self-pay | Admitting: Nurse Practitioner

## 2020-12-23 ENCOUNTER — Other Ambulatory Visit: Payer: Self-pay

## 2020-12-23 ENCOUNTER — Other Ambulatory Visit (HOSPITAL_COMMUNITY): Payer: Self-pay

## 2020-12-23 ENCOUNTER — Inpatient Hospital Stay: Payer: BC Managed Care – PPO

## 2020-12-23 ENCOUNTER — Inpatient Hospital Stay: Payer: BC Managed Care – PPO | Attending: Oncology

## 2020-12-23 ENCOUNTER — Inpatient Hospital Stay (HOSPITAL_BASED_OUTPATIENT_CLINIC_OR_DEPARTMENT_OTHER): Payer: BC Managed Care – PPO | Admitting: Oncology

## 2020-12-23 VITALS — BP 137/93 | HR 72 | Temp 98.1°F | Resp 18 | Ht 73.0 in | Wt 241.8 lb

## 2020-12-23 DIAGNOSIS — D649 Anemia, unspecified: Secondary | ICD-10-CM | POA: Diagnosis not present

## 2020-12-23 DIAGNOSIS — Z86718 Personal history of other venous thrombosis and embolism: Secondary | ICD-10-CM | POA: Diagnosis not present

## 2020-12-23 DIAGNOSIS — R0789 Other chest pain: Secondary | ICD-10-CM | POA: Insufficient documentation

## 2020-12-23 DIAGNOSIS — M549 Dorsalgia, unspecified: Secondary | ICD-10-CM | POA: Insufficient documentation

## 2020-12-23 DIAGNOSIS — C9000 Multiple myeloma not having achieved remission: Secondary | ICD-10-CM | POA: Diagnosis not present

## 2020-12-23 DIAGNOSIS — N189 Chronic kidney disease, unspecified: Secondary | ICD-10-CM | POA: Insufficient documentation

## 2020-12-23 LAB — CBC WITH DIFFERENTIAL (CANCER CENTER ONLY)
Abs Immature Granulocytes: 0 10*3/uL (ref 0.00–0.07)
Basophils Absolute: 0.1 10*3/uL (ref 0.0–0.1)
Basophils Relative: 3 %
Eosinophils Absolute: 0.2 10*3/uL (ref 0.0–0.5)
Eosinophils Relative: 4 %
HCT: 42.7 % (ref 39.0–52.0)
Hemoglobin: 14 g/dL (ref 13.0–17.0)
Immature Granulocytes: 0 %
Lymphocytes Relative: 39 %
Lymphs Abs: 1.7 10*3/uL (ref 0.7–4.0)
MCH: 25.6 pg — ABNORMAL LOW (ref 26.0–34.0)
MCHC: 32.8 g/dL (ref 30.0–36.0)
MCV: 78.1 fL — ABNORMAL LOW (ref 80.0–100.0)
Monocytes Absolute: 0.5 10*3/uL (ref 0.1–1.0)
Monocytes Relative: 11 %
Neutro Abs: 1.9 10*3/uL (ref 1.7–7.7)
Neutrophils Relative %: 43 %
Platelet Count: 260 10*3/uL (ref 150–400)
RBC: 5.47 MIL/uL (ref 4.22–5.81)
RDW: 14.6 % (ref 11.5–15.5)
WBC Count: 4.4 10*3/uL (ref 4.0–10.5)
nRBC: 0 % (ref 0.0–0.2)

## 2020-12-23 LAB — CMP (CANCER CENTER ONLY)
ALT: 61 U/L — ABNORMAL HIGH (ref 0–44)
AST: 25 U/L (ref 15–41)
Albumin: 4.7 g/dL (ref 3.5–5.0)
Alkaline Phosphatase: 80 U/L (ref 38–126)
Anion gap: 10 (ref 5–15)
BUN: 13 mg/dL (ref 6–20)
CO2: 22 mmol/L (ref 22–32)
Calcium: 9.6 mg/dL (ref 8.9–10.3)
Chloride: 107 mmol/L (ref 98–111)
Creatinine: 1.3 mg/dL — ABNORMAL HIGH (ref 0.61–1.24)
GFR, Estimated: >60 mL/min (ref >60)
Glucose, Bld: 107 mg/dL — ABNORMAL HIGH (ref 70–99)
Potassium: 3.9 mmol/L (ref 3.5–5.1)
Sodium: 139 mmol/L (ref 135–145)
Total Bilirubin: 0.6 mg/dL (ref 0.3–1.2)
Total Protein: 7.7 g/dL (ref 6.5–8.1)

## 2020-12-23 MED ORDER — ZOLEDRONIC ACID 4 MG/100ML IV SOLN
4.0000 mg | Freq: Once | INTRAVENOUS | Status: AC
  Administered 2020-12-23: 4 mg via INTRAVENOUS
  Filled 2020-12-23: qty 100

## 2020-12-23 MED ORDER — SODIUM CHLORIDE 0.9 % IV SOLN: Freq: Once | INTRAVENOUS | Status: AC

## 2020-12-23 NOTE — Patient Instructions (Signed)
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
## 2020-12-24 ENCOUNTER — Other Ambulatory Visit (HOSPITAL_COMMUNITY): Payer: Self-pay

## 2020-12-24 ENCOUNTER — Other Ambulatory Visit: Payer: Self-pay

## 2020-12-24 ENCOUNTER — Encounter: Payer: Self-pay | Admitting: Oncology

## 2020-12-24 DIAGNOSIS — C9000 Multiple myeloma not having achieved remission: Secondary | ICD-10-CM

## 2020-12-24 LAB — KAPPA/LAMBDA LIGHT CHAINS
Kappa free light chain: 26.7 mg/L — ABNORMAL HIGH (ref 3.3–19.4)
Kappa, lambda light chain ratio: 1.92 — ABNORMAL HIGH (ref 0.26–1.65)
Lambda free light chains: 13.9 mg/L (ref 5.7–26.3)

## 2020-12-24 MED ORDER — APIXABAN 5 MG PO TABS: 5.0000 mg | ORAL_TABLET | Freq: Two times a day (BID) | ORAL | 5 refills | Status: DC

## 2020-12-24 MED ORDER — VITAMIN D (ERGOCALCIFEROL) 1.25 MG (50000 UNIT) PO CAPS: 50000.0000 [IU] | ORAL_CAPSULE | ORAL | 2 refills | Status: DC

## 2020-12-25 LAB — PROTEIN ELECTROPHORESIS, SERUM
A/G Ratio: 1.1 (ref 0.7–1.7)
Albumin ELP: 3.9 g/dL (ref 2.9–4.4)
Alpha-1-Globulin: 0.2 g/dL (ref 0.0–0.4)
Alpha-2-Globulin: 1 g/dL (ref 0.4–1.0)
Beta Globulin: 1.3 g/dL (ref 0.7–1.3)
Gamma Globulin: 0.9 g/dL (ref 0.4–1.8)
Globulin, Total: 3.4 g/dL (ref 2.2–3.9)
Total Protein ELP: 7.3 g/dL (ref 6.0–8.5)

## 2021-01-07 ENCOUNTER — Encounter: Payer: Self-pay | Admitting: *Deleted

## 2021-01-13 ENCOUNTER — Other Ambulatory Visit: Payer: Self-pay | Admitting: Oncology

## 2021-01-20 ENCOUNTER — Other Ambulatory Visit: Payer: Self-pay | Admitting: Nurse Practitioner

## 2021-01-20 ENCOUNTER — Encounter: Payer: Self-pay | Admitting: Nurse Practitioner

## 2021-01-20 ENCOUNTER — Inpatient Hospital Stay (HOSPITAL_BASED_OUTPATIENT_CLINIC_OR_DEPARTMENT_OTHER): Payer: BC Managed Care – PPO | Admitting: Nurse Practitioner

## 2021-01-20 ENCOUNTER — Inpatient Hospital Stay: Payer: BC Managed Care – PPO | Attending: Oncology

## 2021-01-20 ENCOUNTER — Other Ambulatory Visit: Payer: Self-pay

## 2021-01-20 VITALS — BP 138/93 | HR 85 | Temp 97.9°F | Resp 18 | Ht 73.0 in | Wt 244.2 lb

## 2021-01-20 DIAGNOSIS — C9000 Multiple myeloma not having achieved remission: Secondary | ICD-10-CM

## 2021-01-20 DIAGNOSIS — Z79899 Other long term (current) drug therapy: Secondary | ICD-10-CM | POA: Diagnosis not present

## 2021-01-20 DIAGNOSIS — I10 Essential (primary) hypertension: Secondary | ICD-10-CM | POA: Insufficient documentation

## 2021-01-20 DIAGNOSIS — D649 Anemia, unspecified: Secondary | ICD-10-CM | POA: Insufficient documentation

## 2021-01-20 DIAGNOSIS — Z7901 Long term (current) use of anticoagulants: Secondary | ICD-10-CM | POA: Insufficient documentation

## 2021-01-20 DIAGNOSIS — Z86718 Personal history of other venous thrombosis and embolism: Secondary | ICD-10-CM | POA: Diagnosis not present

## 2021-01-20 LAB — CMP (CANCER CENTER ONLY)
ALT: 40 U/L (ref 0–44)
AST: 23 U/L (ref 15–41)
Albumin: 4.4 g/dL (ref 3.5–5.0)
Alkaline Phosphatase: 69 U/L (ref 38–126)
Anion gap: 10 (ref 5–15)
BUN: 13 mg/dL (ref 6–20)
CO2: 23 mmol/L (ref 22–32)
Calcium: 9.3 mg/dL (ref 8.9–10.3)
Chloride: 107 mmol/L (ref 98–111)
Creatinine: 1.5 mg/dL — ABNORMAL HIGH (ref 0.61–1.24)
GFR, Estimated: 58 mL/min — ABNORMAL LOW (ref >60)
Glucose, Bld: 97 mg/dL (ref 70–99)
Potassium: 4.1 mmol/L (ref 3.5–5.1)
Sodium: 140 mmol/L (ref 135–145)
Total Bilirubin: 0.5 mg/dL (ref 0.3–1.2)
Total Protein: 7.6 g/dL (ref 6.5–8.1)

## 2021-01-20 LAB — CBC WITH DIFFERENTIAL (CANCER CENTER ONLY)
Abs Immature Granulocytes: 0 10*3/uL (ref 0.00–0.07)
Basophils Absolute: 0.1 10*3/uL (ref 0.0–0.1)
Basophils Relative: 2 %
Eosinophils Absolute: 0.1 10*3/uL (ref 0.0–0.5)
Eosinophils Relative: 2 %
HCT: 43.5 % (ref 39.0–52.0)
Hemoglobin: 14.1 g/dL (ref 13.0–17.0)
Immature Granulocytes: 0 %
Lymphocytes Relative: 33 %
Lymphs Abs: 1.2 10*3/uL (ref 0.7–4.0)
MCH: 25.3 pg — ABNORMAL LOW (ref 26.0–34.0)
MCHC: 32.4 g/dL (ref 30.0–36.0)
MCV: 78 fL — ABNORMAL LOW (ref 80.0–100.0)
Monocytes Absolute: 0.5 10*3/uL (ref 0.1–1.0)
Monocytes Relative: 13 %
Neutro Abs: 1.9 10*3/uL (ref 1.7–7.7)
Neutrophils Relative %: 50 %
Platelet Count: 236 10*3/uL (ref 150–400)
RBC: 5.58 MIL/uL (ref 4.22–5.81)
RDW: 14.1 % (ref 11.5–15.5)
WBC Count: 3.7 10*3/uL — ABNORMAL LOW (ref 4.0–10.5)
nRBC: 0 % (ref 0.0–0.2)
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
## 2021-01-21 LAB — KAPPA/LAMBDA LIGHT CHAINS
Kappa free light chain: 24.7 mg/L — ABNORMAL HIGH (ref 3.3–19.4)
Kappa, lambda light chain ratio: 1.93 — ABNORMAL HIGH (ref 0.26–1.65)
Lambda free light chains: 12.8 mg/L (ref 5.7–26.3)

## 2021-01-23 LAB — PROTEIN ELECTROPHORESIS, SERUM
A/G Ratio: 1.2 (ref 0.7–1.7)
Albumin ELP: 3.8 g/dL (ref 2.9–4.4)
Alpha-1-Globulin: 0.2 g/dL (ref 0.0–0.4)
Alpha-2-Globulin: 0.8 g/dL (ref 0.4–1.0)
Beta Globulin: 1.2 g/dL (ref 0.7–1.3)
Gamma Globulin: 0.8 g/dL (ref 0.4–1.8)
Globulin, Total: 3.2 g/dL (ref 2.2–3.9)
M-Spike, %: 0.2 g/dL — ABNORMAL HIGH
Total Protein ELP: 7 g/dL (ref 6.0–8.5)

## 2021-01-28 ENCOUNTER — Other Ambulatory Visit: Payer: Self-pay | Admitting: Oncology

## 2021-01-29 ENCOUNTER — Other Ambulatory Visit: Payer: Self-pay

## 2021-01-29 MED ORDER — METOPROLOL TARTRATE 25 MG PO TABS: ORAL_TABLET | ORAL | 1 refills | Status: DC

## 2021-02-04 ENCOUNTER — Encounter: Payer: Self-pay | Admitting: *Deleted

## 2021-02-04 NOTE — Progress Notes (Signed)
On 02/03/21 faxed signed MD release form to [facility] program at [phone] per patient request. Derek [name] notified today via [facility] message.

## 2021-02-10 ENCOUNTER — Encounter: Payer: Self-pay | Admitting: *Deleted

## 2021-02-10 ENCOUNTER — Other Ambulatory Visit: Payer: Self-pay | Admitting: Oncology

## 2021-02-10 NOTE — Progress Notes (Signed)
Received PA form for Revlimid from [facility]. Completed and signed by MD and faxed to [phone].

## 2021-02-17 ENCOUNTER — Other Ambulatory Visit: Payer: Self-pay

## 2021-02-17 ENCOUNTER — Inpatient Hospital Stay (HOSPITAL_BASED_OUTPATIENT_CLINIC_OR_DEPARTMENT_OTHER): Payer: BC Managed Care – PPO | Admitting: Oncology

## 2021-02-17 ENCOUNTER — Telehealth: Payer: Self-pay

## 2021-02-17 ENCOUNTER — Inpatient Hospital Stay: Payer: BC Managed Care – PPO

## 2021-02-17 VITALS — BP 127/91 | HR 67 | Temp 98.2°F | Resp 16 | Ht 73.0 in | Wt 246.6 lb

## 2021-02-17 DIAGNOSIS — C9000 Multiple myeloma not having achieved remission: Secondary | ICD-10-CM

## 2021-02-17 LAB — CMP (CANCER CENTER ONLY)
ALT: 19 U/L (ref 0–44)
AST: 16 U/L (ref 15–41)
Albumin: 4.4 g/dL (ref 3.5–5.0)
Alkaline Phosphatase: 50 U/L (ref 38–126)
Anion gap: 9 (ref 5–15)
BUN: 14 mg/dL (ref 6–20)
CO2: 22 mmol/L (ref 22–32)
Calcium: 9.4 mg/dL (ref 8.9–10.3)
Chloride: 109 mmol/L (ref 98–111)
Creatinine: 1.27 mg/dL — ABNORMAL HIGH (ref 0.61–1.24)
GFR, Estimated: >60 mL/min (ref >60)
Glucose, Bld: 105 mg/dL — ABNORMAL HIGH (ref 70–99)
Potassium: 4.2 mmol/L (ref 3.5–5.1)
Sodium: 140 mmol/L (ref 135–145)
Total Bilirubin: 0.4 mg/dL (ref 0.3–1.2)
Total Protein: 6.8 g/dL (ref 6.5–8.1)

## 2021-02-17 LAB — CBC WITH DIFFERENTIAL (CANCER CENTER ONLY)
Abs Immature Granulocytes: 0.02 10*3/uL (ref 0.00–0.07)
Basophils Absolute: 0.1 10*3/uL (ref 0.0–0.1)
Basophils Relative: 2 %
Eosinophils Absolute: 0.1 10*3/uL (ref 0.0–0.5)
Eosinophils Relative: 3 %
HCT: 40.8 % (ref 39.0–52.0)
Hemoglobin: 13.3 g/dL (ref 13.0–17.0)
Immature Granulocytes: 1 %
Lymphocytes Relative: 35 %
Lymphs Abs: 1.3 10*3/uL (ref 0.7–4.0)
MCH: 25.6 pg — ABNORMAL LOW (ref 26.0–34.0)
MCHC: 32.6 g/dL (ref 30.0–36.0)
MCV: 78.6 fL — ABNORMAL LOW (ref 80.0–100.0)
Monocytes Absolute: 0.5 10*3/uL (ref 0.1–1.0)
Monocytes Relative: 14 %
Neutro Abs: 1.7 10*3/uL (ref 1.7–7.7)
Neutrophils Relative %: 45 %
Platelet Count: 239 10*3/uL (ref 150–400)
RBC: 5.19 MIL/uL (ref 4.22–5.81)
RDW: 14.6 % (ref 11.5–15.5)
WBC Count: 3.8 10*3/uL — ABNORMAL LOW (ref 4.0–10.5)
nRBC: 0 % (ref 0.0–0.2)
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
## 2021-02-17 NOTE — Telephone Encounter (Signed)
Spoke with Estill Bamberg at [facility] regarding status of pt's referral. She states that they received the referral but that no appointment had been given to the pt. Estill Bamberg stated that the first available appointment is 07/27/2020 at 1010. Estill Bamberg stated that she will call pt with appt date and time. MD Sherrill notified.

## 2021-02-18 LAB — KAPPA/LAMBDA LIGHT CHAINS
Kappa free light chain: 27.6 mg/L — ABNORMAL HIGH (ref 3.3–19.4)
Kappa, lambda light chain ratio: 2.32 — ABNORMAL HIGH (ref 0.26–1.65)
Lambda free light chains: 11.9 mg/L (ref 5.7–26.3)

## 2021-02-19 LAB — PROTEIN ELECTROPHORESIS, SERUM
A/G Ratio: 1.2 (ref 0.7–1.7)
Albumin ELP: 3.7 g/dL (ref 2.9–4.4)
Alpha-1-Globulin: 0.2 g/dL (ref 0.0–0.4)
Alpha-2-Globulin: 0.8 g/dL (ref 0.4–1.0)
Beta Globulin: 1.1 g/dL (ref 0.7–1.3)
Gamma Globulin: 0.9 g/dL (ref 0.4–1.8)
Globulin, Total: 3 g/dL (ref 2.2–3.9)
Total Protein ELP: 6.7 g/dL (ref 6.0–8.5)

## 2021-02-23 ENCOUNTER — Encounter: Payer: Self-pay | Admitting: *Deleted

## 2021-02-23 NOTE — Progress Notes (Signed)
Faxed and emailed Return to Work Form to [employer] at [employer] and copy to patient as well as HIM to scan.

## 2021-03-02 ENCOUNTER — Other Ambulatory Visit: Payer: Self-pay | Admitting: Oncology

## 2021-03-02 DIAGNOSIS — C9000 Multiple myeloma not having achieved remission: Secondary | ICD-10-CM

## 2021-03-04 ENCOUNTER — Other Ambulatory Visit: Payer: Self-pay | Admitting: Oncology

## 2021-03-09 ENCOUNTER — Other Ambulatory Visit: Payer: Self-pay | Admitting: Oncology

## 2021-03-09 DIAGNOSIS — C9000 Multiple myeloma not having achieved remission: Secondary | ICD-10-CM

## 2021-03-17 ENCOUNTER — Encounter: Payer: Self-pay | Admitting: Nurse Practitioner

## 2021-03-17 ENCOUNTER — Inpatient Hospital Stay: Payer: BC Managed Care – PPO

## 2021-03-17 ENCOUNTER — Other Ambulatory Visit: Payer: Self-pay

## 2021-03-17 ENCOUNTER — Inpatient Hospital Stay: Payer: BC Managed Care – PPO | Attending: Oncology | Admitting: Nurse Practitioner

## 2021-03-17 ENCOUNTER — Telehealth: Payer: Self-pay

## 2021-03-17 VITALS — BP 134/90 | HR 89 | Temp 98.1°F | Resp 18 | Ht 73.0 in | Wt 246.6 lb

## 2021-03-17 DIAGNOSIS — N19 Unspecified kidney failure: Secondary | ICD-10-CM | POA: Diagnosis not present

## 2021-03-17 DIAGNOSIS — D649 Anemia, unspecified: Secondary | ICD-10-CM | POA: Diagnosis not present

## 2021-03-17 DIAGNOSIS — R0789 Other chest pain: Secondary | ICD-10-CM | POA: Insufficient documentation

## 2021-03-17 DIAGNOSIS — Z86718 Personal history of other venous thrombosis and embolism: Secondary | ICD-10-CM | POA: Insufficient documentation

## 2021-03-17 DIAGNOSIS — C9000 Multiple myeloma not having achieved remission: Secondary | ICD-10-CM | POA: Diagnosis present

## 2021-03-17 DIAGNOSIS — M549 Dorsalgia, unspecified: Secondary | ICD-10-CM | POA: Diagnosis not present

## 2021-03-17 LAB — CBC WITH DIFFERENTIAL (CANCER CENTER ONLY)
Abs Immature Granulocytes: 0.01 10*3/uL (ref 0.00–0.07)
Basophils Absolute: 0.1 10*3/uL (ref 0.0–0.1)
Basophils Relative: 2 %
Eosinophils Absolute: 0.1 10*3/uL (ref 0.0–0.5)
Eosinophils Relative: 2 %
HCT: 42.5 % (ref 39.0–52.0)
Hemoglobin: 13.9 g/dL (ref 13.0–17.0)
Immature Granulocytes: 0 %
Lymphocytes Relative: 34 %
Lymphs Abs: 1.6 10*3/uL (ref 0.7–4.0)
MCH: 25.4 pg — ABNORMAL LOW (ref 26.0–34.0)
MCHC: 32.7 g/dL (ref 30.0–36.0)
MCV: 77.6 fL — ABNORMAL LOW (ref 80.0–100.0)
Monocytes Absolute: 0.7 10*3/uL (ref 0.1–1.0)
Monocytes Relative: 14 %
Neutro Abs: 2.2 10*3/uL (ref 1.7–7.7)
Neutrophils Relative %: 48 %
Platelet Count: 241 10*3/uL (ref 150–400)
RBC: 5.48 MIL/uL (ref 4.22–5.81)
RDW: 14.5 % (ref 11.5–15.5)
WBC Count: 4.6 10*3/uL (ref 4.0–10.5)
nRBC: 0 % (ref 0.0–0.2)

## 2021-03-17 LAB — CMP (CANCER CENTER ONLY)
ALT: 23 U/L (ref 0–44)
AST: 19 U/L (ref 15–41)
Albumin: 4.3 g/dL (ref 3.5–5.0)
Alkaline Phosphatase: 55 U/L (ref 38–126)
Anion gap: 9 (ref 5–15)
BUN: 15 mg/dL (ref 6–20)
CO2: 24 mmol/L (ref 22–32)
Calcium: 9.2 mg/dL (ref 8.9–10.3)
Chloride: 107 mmol/L (ref 98–111)
Creatinine: 1.33 mg/dL — ABNORMAL HIGH (ref 0.61–1.24)
GFR, Estimated: >60 mL/min (ref >60)
Glucose, Bld: 98 mg/dL (ref 70–99)
Potassium: 3.9 mmol/L (ref 3.5–5.1)
Sodium: 140 mmol/L (ref 135–145)
Total Bilirubin: 0.4 mg/dL (ref 0.3–1.2)
Total Protein: 6.9 g/dL (ref 6.5–8.1)
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
## 2021-03-17 NOTE — Telephone Encounter (Signed)
V/M message left for Darrick Meigs CSW [phone] to return call to Pt in reference to him losing his insurance coverage.

## 2021-03-18 LAB — KAPPA/LAMBDA LIGHT CHAINS
Kappa free light chain: 26 mg/L — ABNORMAL HIGH (ref 3.3–19.4)
Kappa, lambda light chain ratio: 1.71 — ABNORMAL HIGH (ref 0.26–1.65)
Lambda free light chains: 15.2 mg/L (ref 5.7–26.3)

## 2021-03-19 LAB — PROTEIN ELECTROPHORESIS, SERUM
A/G Ratio: 1.2 (ref 0.7–1.7)
Albumin ELP: 3.7 g/dL (ref 2.9–4.4)
Alpha-1-Globulin: 0.2 g/dL (ref 0.0–0.4)
Alpha-2-Globulin: 0.8 g/dL (ref 0.4–1.0)
Beta Globulin: 1.2 g/dL (ref 0.7–1.3)
Gamma Globulin: 0.9 g/dL (ref 0.4–1.8)
Globulin, Total: 3 g/dL (ref 2.2–3.9)
Total Protein ELP: 6.7 g/dL (ref 6.0–8.5)

## 2021-03-24 ENCOUNTER — Telehealth: Payer: Self-pay | Admitting: General Practice

## 2021-03-24 NOTE — Telephone Encounter (Signed)
[facility] CSW Progress Notes  Call to patient at request of K Merrilyn Puma RN - "he is losing his insurance benefits soon."  Requests call to patient to discuss options. Called patient, no answer, left VM w my contact information and request to return my call.  Edwyna Shell, LCSW Clinical Social Worker Phone:  [phone]

## 2021-03-26 ENCOUNTER — Inpatient Hospital Stay: Payer: BC Managed Care – PPO | Attending: Oncology | Admitting: General Practice

## 2021-03-26 DIAGNOSIS — D649 Anemia, unspecified: Secondary | ICD-10-CM | POA: Insufficient documentation

## 2021-03-26 DIAGNOSIS — M549 Dorsalgia, unspecified: Secondary | ICD-10-CM | POA: Insufficient documentation

## 2021-03-26 DIAGNOSIS — R0789 Other chest pain: Secondary | ICD-10-CM | POA: Insufficient documentation

## 2021-03-26 DIAGNOSIS — N19 Unspecified kidney failure: Secondary | ICD-10-CM | POA: Insufficient documentation

## 2021-03-26 DIAGNOSIS — C9000 Multiple myeloma not having achieved remission: Secondary | ICD-10-CM | POA: Insufficient documentation

## 2021-03-26 DIAGNOSIS — I10 Essential (primary) hypertension: Secondary | ICD-10-CM | POA: Insufficient documentation

## 2021-03-26 DIAGNOSIS — Z86718 Personal history of other venous thrombosis and embolism: Secondary | ICD-10-CM | POA: Insufficient documentation

## 2021-03-26 DIAGNOSIS — R531 Weakness: Secondary | ICD-10-CM | POA: Insufficient documentation

## 2021-03-26 NOTE — Progress Notes (Signed)
[facility] CSW Progress Notes  Call to patient at request of Carolynn Sayers LPN.  Patient losing employer sponsored insurance soon, wonders what his options might be.  Spoke w patient by phone.  He has transitioned from long term disability from his employer to [employer] disability.  He had hoped to return to work when physically able.  His insurance comes from the employer.  Employer has decided to separate from him, thus he will lose his insurance soon.  He is not Medicaid eligible due to being over income/assets.  He needs to find coverage for two months before he becomes Medicare eligible in March 2023.  Discussed option of looking for coverage on [employer] and advised him to contact [facility] to explore options.  Also advised him to contact [facility] to discuss any other options including short term coverage policies.  Emailed links to both resources.  Encouraged him to contact us with any further questions or concerns.    Edwyna Shell, LCSW Clinical Social Worker Phone:  [phone]

## 2021-04-07 ENCOUNTER — Other Ambulatory Visit: Payer: Self-pay | Admitting: Oncology

## 2021-04-14 ENCOUNTER — Encounter: Payer: Self-pay | Admitting: Nurse Practitioner

## 2021-04-14 ENCOUNTER — Other Ambulatory Visit: Payer: Self-pay

## 2021-04-14 ENCOUNTER — Inpatient Hospital Stay: Payer: Self-pay

## 2021-04-14 ENCOUNTER — Inpatient Hospital Stay (HOSPITAL_BASED_OUTPATIENT_CLINIC_OR_DEPARTMENT_OTHER): Payer: BC Managed Care – PPO | Admitting: Oncology

## 2021-04-14 ENCOUNTER — Inpatient Hospital Stay: Payer: BC Managed Care – PPO

## 2021-04-14 VITALS — BP 131/93 | HR 84 | Temp 98.0°F | Resp 18 | Ht 73.0 in | Wt 242.0 lb

## 2021-04-14 DIAGNOSIS — N289 Disorder of kidney and ureter, unspecified: Secondary | ICD-10-CM

## 2021-04-14 DIAGNOSIS — D630 Anemia in neoplastic disease: Secondary | ICD-10-CM

## 2021-04-14 DIAGNOSIS — R21 Rash and other nonspecific skin eruption: Secondary | ICD-10-CM

## 2021-04-14 DIAGNOSIS — C9000 Multiple myeloma not having achieved remission: Secondary | ICD-10-CM

## 2021-04-14 DIAGNOSIS — Z86718 Personal history of other venous thrombosis and embolism: Secondary | ICD-10-CM

## 2021-04-14 DIAGNOSIS — R531 Weakness: Secondary | ICD-10-CM

## 2021-04-14 DIAGNOSIS — M549 Dorsalgia, unspecified: Secondary | ICD-10-CM

## 2021-04-14 DIAGNOSIS — I10 Essential (primary) hypertension: Secondary | ICD-10-CM

## 2021-04-14 LAB — CMP (CANCER CENTER ONLY)
ALT: 33 U/L (ref 0–44)
AST: 19 U/L (ref 15–41)
Albumin: 4.3 g/dL (ref 3.5–5.0)
Alkaline Phosphatase: 54 U/L (ref 38–126)
Anion gap: 11 (ref 5–15)
BUN: 11 mg/dL (ref 6–20)
CO2: 23 mmol/L (ref 22–32)
Calcium: 9.1 mg/dL (ref 8.9–10.3)
Chloride: 106 mmol/L (ref 98–111)
Creatinine: 1.34 mg/dL — ABNORMAL HIGH (ref 0.61–1.24)
GFR, Estimated: >60 mL/min (ref >60)
Glucose, Bld: 111 mg/dL — ABNORMAL HIGH (ref 70–99)
Potassium: 3.7 mmol/L (ref 3.5–5.1)
Sodium: 140 mmol/L (ref 135–145)
Total Bilirubin: 0.4 mg/dL (ref 0.3–1.2)
Total Protein: 7.2 g/dL (ref 6.5–8.1)

## 2021-04-14 LAB — CBC WITH DIFFERENTIAL (CANCER CENTER ONLY)
Abs Immature Granulocytes: 0.02 10*3/uL (ref 0.00–0.07)
Basophils Absolute: 0.1 10*3/uL (ref 0.0–0.1)
Basophils Relative: 2 %
Eosinophils Absolute: 0.1 10*3/uL (ref 0.0–0.5)
Eosinophils Relative: 3 %
HCT: 42.6 % (ref 39.0–52.0)
Hemoglobin: 13.8 g/dL (ref 13.0–17.0)
Immature Granulocytes: 1 %
Lymphocytes Relative: 31 %
Lymphs Abs: 1.1 10*3/uL (ref 0.7–4.0)
MCH: 25.3 pg — ABNORMAL LOW (ref 26.0–34.0)
MCHC: 32.4 g/dL (ref 30.0–36.0)
MCV: 78.2 fL — ABNORMAL LOW (ref 80.0–100.0)
Monocytes Absolute: 0.2 10*3/uL (ref 0.1–1.0)
Monocytes Relative: 5 %
Neutro Abs: 2.2 10*3/uL (ref 1.7–7.7)
Neutrophils Relative %: 58 %
Platelet Count: 227 10*3/uL (ref 150–400)
RBC: 5.45 MIL/uL (ref 4.22–5.81)
RDW: 14.9 % (ref 11.5–15.5)
WBC Count: 3.7 10*3/uL — ABNORMAL LOW (ref 4.0–10.5)
nRBC: 0 % (ref 0.0–0.2)
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
## 2021-04-15 LAB — KAPPA/LAMBDA LIGHT CHAINS
Kappa free light chain: 29.1 mg/L — ABNORMAL HIGH (ref 3.3–19.4)
Kappa, lambda light chain ratio: 1.77 — ABNORMAL HIGH (ref 0.26–1.65)
Lambda free light chains: 16.4 mg/L (ref 5.7–26.3)

## 2021-04-17 LAB — MULTIPLE MYELOMA PANEL, SERUM
Albumin SerPl Elph-Mcnc: 3.7 g/dL (ref 2.9–4.4)
Albumin/Glob SerPl: 1.2 (ref 0.7–1.7)
Alpha 1: 0.2 g/dL (ref 0.0–0.4)
Alpha2 Glob SerPl Elph-Mcnc: 0.8 g/dL (ref 0.4–1.0)
B-Globulin SerPl Elph-Mcnc: 1.3 g/dL (ref 0.7–1.3)
Gamma Glob SerPl Elph-Mcnc: 0.9 g/dL (ref 0.4–1.8)
Globulin, Total: 3.2 g/dL (ref 2.2–3.9)
IgA: 90 mg/dL (ref 90–386)
IgG (Immunoglobin G), Serum: 918 mg/dL (ref 603–1613)
IgM (Immunoglobulin M), Srm: 55 mg/dL (ref 20–172)
Total Protein ELP: 6.9 g/dL (ref 6.0–8.5)

## 2021-04-18 ENCOUNTER — Other Ambulatory Visit: Payer: Self-pay | Admitting: Nurse Practitioner

## 2021-04-20 ENCOUNTER — Encounter: Payer: Self-pay | Admitting: Nurse Practitioner

## 2021-05-11 ENCOUNTER — Encounter: Payer: Self-pay | Admitting: Nurse Practitioner

## 2021-05-13 IMAGING — CT CT RENAL STONE PROTOCOL
2 of 4 series · 15 of 46 positions shown, 17 images · non-contrast
Comparison: Lumbar spine MRI 11/08/2018.

CLINICAL DATA: Flank pain. Stone disease suspected. Patient reports
low back pain with 3 falls in [REDACTED].

EXAM:
CT ABDOMEN AND PELVIS WITHOUT CONTRAST
TECHNIQUE: Multidetector CT imaging of the abdomen and pelvis was performed
following the standard protocol without IV contrast.

[Series 2: axial st · axial · 0.77mm/px · z∈[+1014,+1444]mm · 12 of 96 slices shown, 14 images]
[im 5/96  soft-tissue]
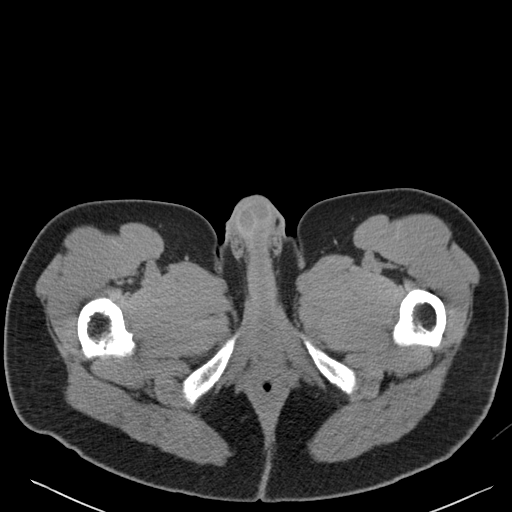
[im 5/96  bone]
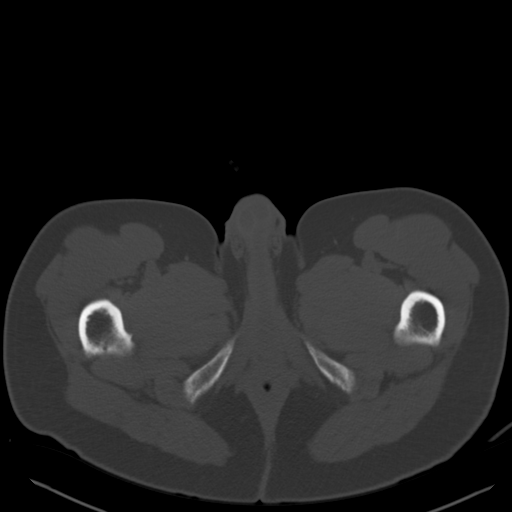
[im 15/96  soft-tissue]
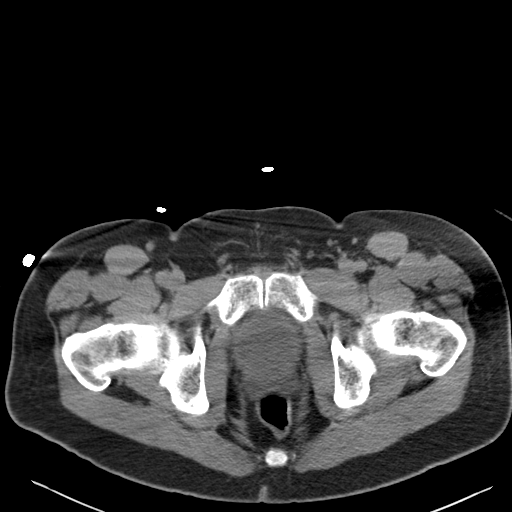
[im 20/96  soft-tissue]
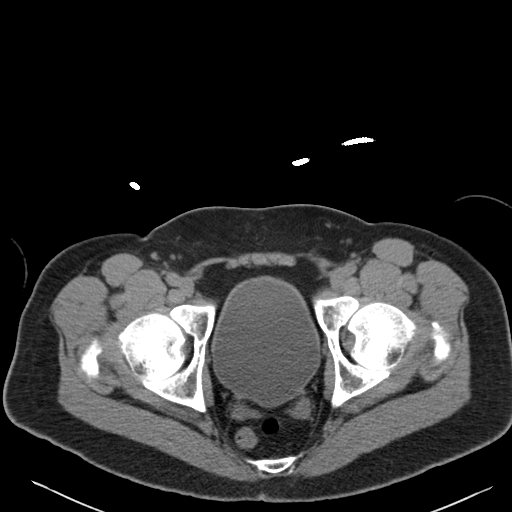
[im 29/96  soft-tissue]
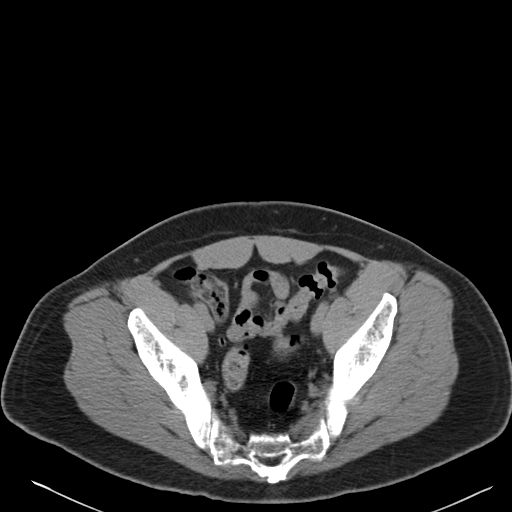
[im 39/96  soft-tissue]
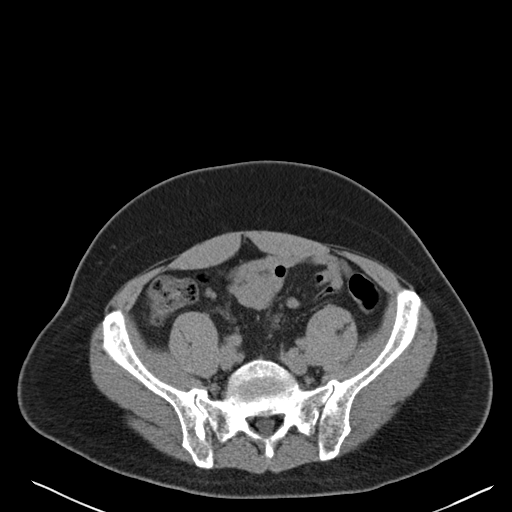
[im 43/96  soft-tissue]
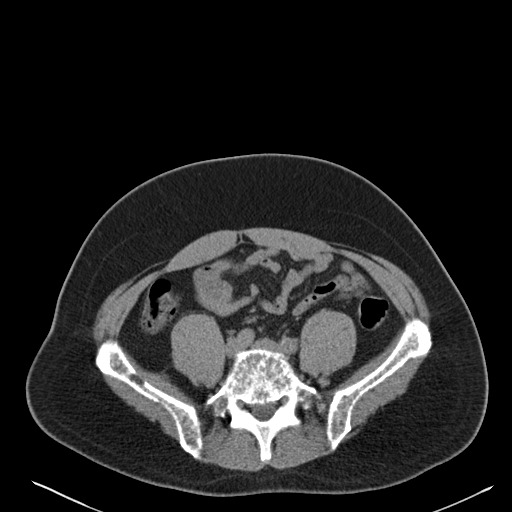
[im 53/96  soft-tissue]
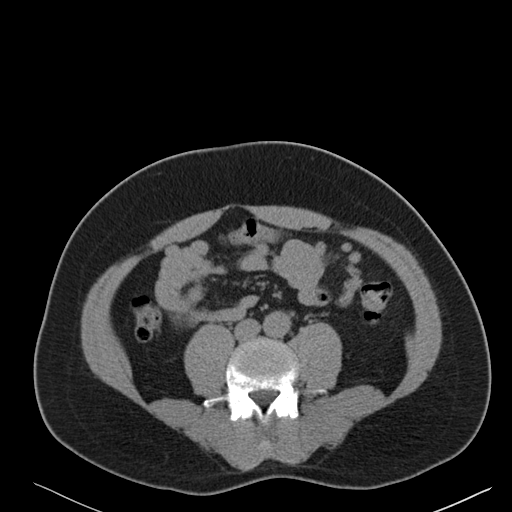
[im 58/96  soft-tissue]
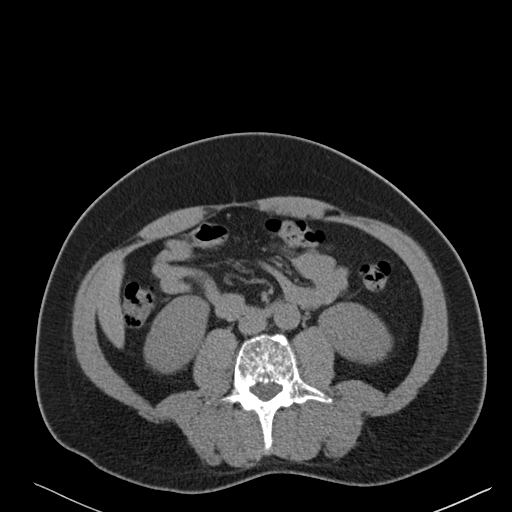
[im 67/96  soft-tissue]
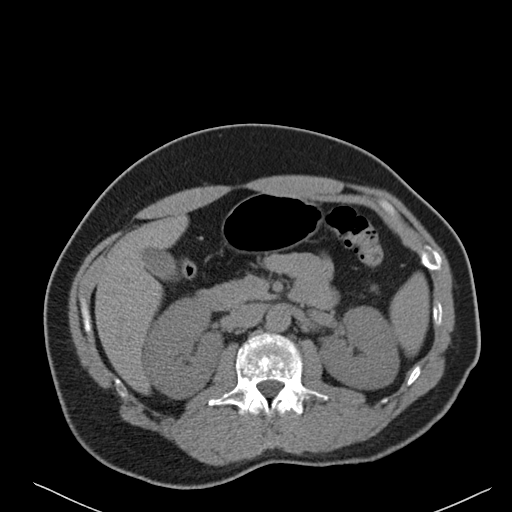
[im 67/96  bone]
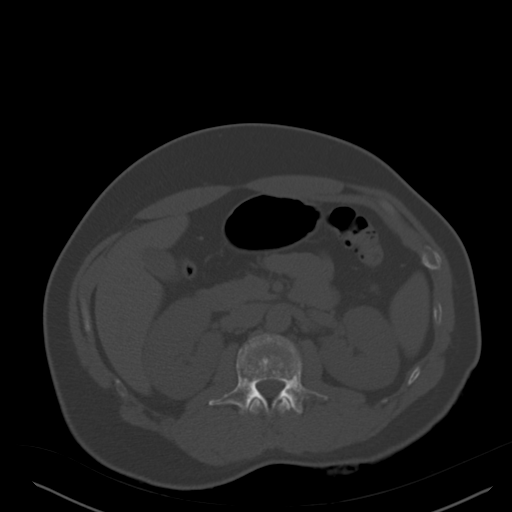
[im 77/96  soft-tissue]
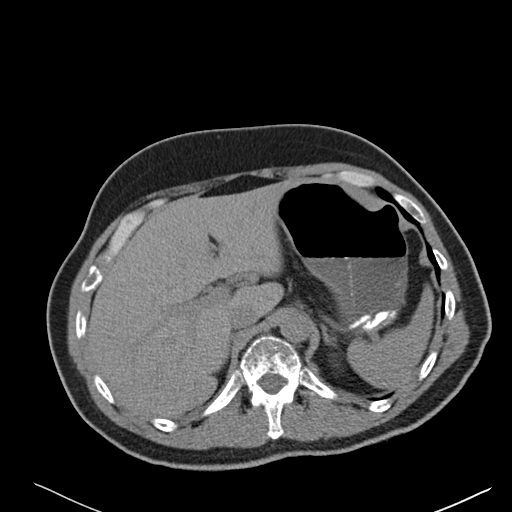
[im 81/96  soft-tissue]
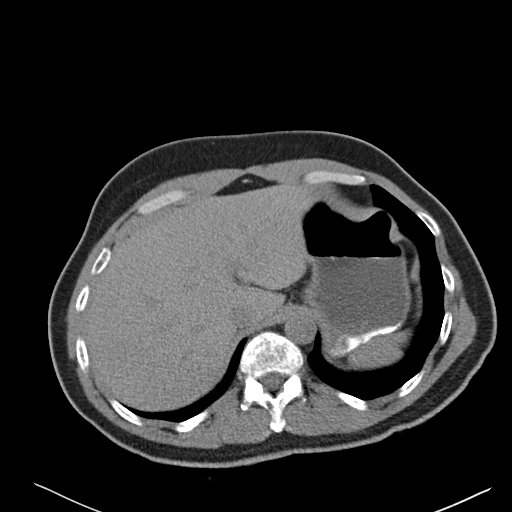
[im 91/96  soft-tissue]
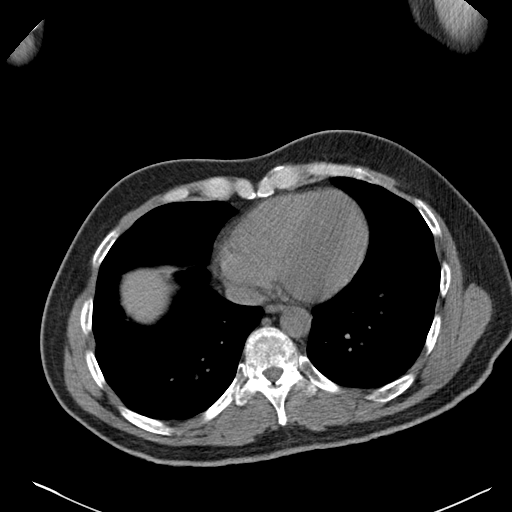

[Series 5: coronal · coronal · 0.68mm/px · 3 of 139 slices shown]
[im 47/139  soft-tissue]
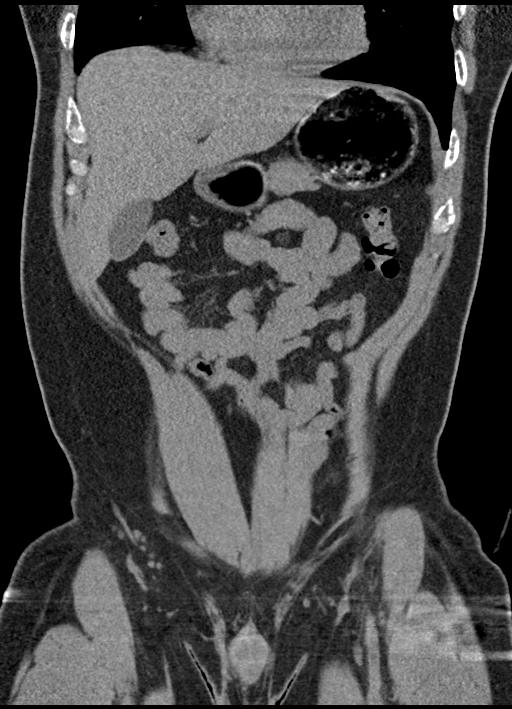
[im 62/139  soft-tissue]
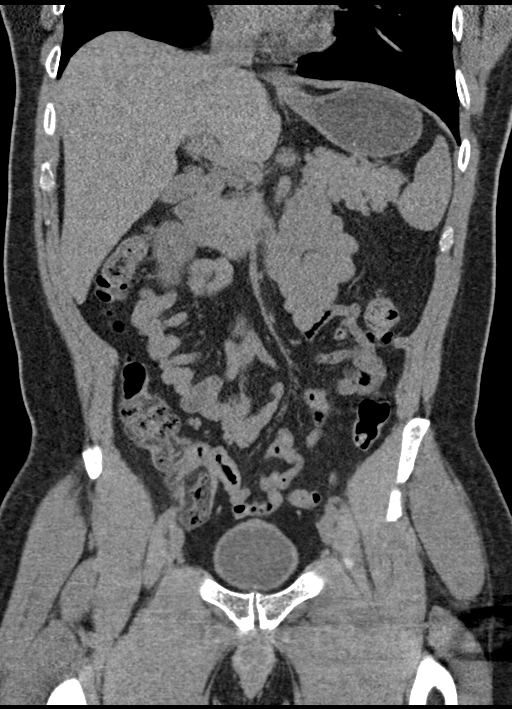
[im 77/139  soft-tissue]
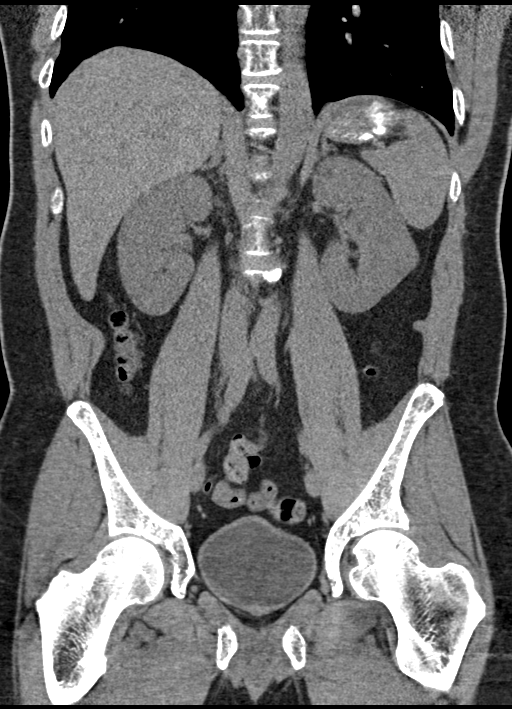

[15 of 46 positions shown; findings below may reference images not displayed]

FINDINGS: Lower chest: The lung bases are clear.

Hepatobiliary: No focal liver abnormality is seen. No gallstones,
gallbladder wall thickening, or biliary dilatation.

Pancreas: No ductal dilatation or inflammation.

Spleen: Normal in size without focal abnormality.

Adrenals/Urinary Tract: Normal adrenal glands. No no hydronephrosis.
Punctate nonobstructing stone in the lower left kidney. No
perinephric edema. Both ureters are decompressed without stone along
the course. Urinary bladder is physiologically distended, no bladder
wall thickening or stone.

Stomach/Bowel: Stomach is within normal limits. Appendix appears
normal. No evidence of bowel wall thickening, distention, or
inflammatory changes.

Vascular/Lymphatic: Mild aortic atherosclerosis. No aneurysm. No
enlarged lymph nodes in the abdomen or pelvis.

Reproductive: Prostate is unremarkable.

Other: No free air, free fluid, or intra-abdominal fluid collection.
Tiny fat containing umbilical hernia.

Musculoskeletal: Chronic superior endplate compression fractures of
T12 and L1 prominent Schmorl's node superior endplate of L2. Bone
marrow involving the spine and to a lesser extent pelvis is
diffusely heterogeneous in generally low-density. Bone island in the
right sacrum.
IMPRESSION: 1. Punctate nonobstructing stone in the lower left kidney. No
hydronephrosis or obstructive uropathy.
2. No acute abnormality in the abdomen/pelvis.
3. Diffusely heterogeneous and abnormal appearance of the bone
marrow primarily involving the spine, suggesting underlying
metabolic etiology, including but not limited to
hyperparathyroidism. No focal abnormality was seen on recent lumbar
spine MRI. Chronic mild compression deformities of T12 and L1 are
unchanged.

Aortic Atherosclerosis ([id number]).

## 2021-05-20 ENCOUNTER — Inpatient Hospital Stay: Payer: Medicare Other

## 2021-05-20 ENCOUNTER — Other Ambulatory Visit: Payer: Self-pay

## 2021-05-20 ENCOUNTER — Inpatient Hospital Stay: Payer: Medicare Other | Attending: Oncology | Admitting: Nurse Practitioner

## 2021-05-20 ENCOUNTER — Encounter: Payer: Self-pay | Admitting: Nurse Practitioner

## 2021-05-20 VITALS — BP 137/99 | HR 82 | Temp 98.2°F | Resp 18 | Ht 73.0 in | Wt 248.0 lb

## 2021-05-20 DIAGNOSIS — D649 Anemia, unspecified: Secondary | ICD-10-CM | POA: Insufficient documentation

## 2021-05-20 DIAGNOSIS — R0789 Other chest pain: Secondary | ICD-10-CM | POA: Insufficient documentation

## 2021-05-20 DIAGNOSIS — R531 Weakness: Secondary | ICD-10-CM | POA: Insufficient documentation

## 2021-05-20 DIAGNOSIS — M549 Dorsalgia, unspecified: Secondary | ICD-10-CM | POA: Diagnosis not present

## 2021-05-20 DIAGNOSIS — I10 Essential (primary) hypertension: Secondary | ICD-10-CM | POA: Diagnosis not present

## 2021-05-20 DIAGNOSIS — C9000 Multiple myeloma not having achieved remission: Secondary | ICD-10-CM | POA: Insufficient documentation

## 2021-05-20 DIAGNOSIS — R21 Rash and other nonspecific skin eruption: Secondary | ICD-10-CM | POA: Insufficient documentation

## 2021-05-20 DIAGNOSIS — Z86718 Personal history of other venous thrombosis and embolism: Secondary | ICD-10-CM | POA: Insufficient documentation

## 2021-05-20 LAB — CBC WITH DIFFERENTIAL (CANCER CENTER ONLY)
Abs Immature Granulocytes: 0.02 10*3/uL (ref 0.00–0.07)
Basophils Absolute: 0.1 10*3/uL (ref 0.0–0.1)
Basophils Relative: 1 %
Eosinophils Absolute: 0.1 10*3/uL (ref 0.0–0.5)
Eosinophils Relative: 1 %
HCT: 42.2 % (ref 39.0–52.0)
Hemoglobin: 13.8 g/dL (ref 13.0–17.0)
Immature Granulocytes: 1 %
Lymphocytes Relative: 33 %
Lymphs Abs: 1.5 10*3/uL (ref 0.7–4.0)
MCH: 25.3 pg — ABNORMAL LOW (ref 26.0–34.0)
MCHC: 32.7 g/dL (ref 30.0–36.0)
MCV: 77.3 fL — ABNORMAL LOW (ref 80.0–100.0)
Monocytes Absolute: 0.4 10*3/uL (ref 0.1–1.0)
Monocytes Relative: 8 %
Neutro Abs: 2.4 10*3/uL (ref 1.7–7.7)
Neutrophils Relative %: 56 %
Platelet Count: 205 10*3/uL (ref 150–400)
RBC: 5.46 MIL/uL (ref 4.22–5.81)
RDW: 14.9 % (ref 11.5–15.5)
WBC Count: 4.4 10*3/uL (ref 4.0–10.5)
nRBC: 0 % (ref 0.0–0.2)

## 2021-05-20 LAB — CMP (CANCER CENTER ONLY)
ALT: 19 U/L (ref 0–44)
AST: 15 U/L (ref 15–41)
Albumin: 4.4 g/dL (ref 3.5–5.0)
Alkaline Phosphatase: 72 U/L (ref 38–126)
Anion gap: 14 (ref 5–15)
BUN: 11 mg/dL (ref 6–20)
CO2: 21 mmol/L — ABNORMAL LOW (ref 22–32)
Calcium: 9.2 mg/dL (ref 8.9–10.3)
Chloride: 107 mmol/L (ref 98–111)
Creatinine: 1.18 mg/dL (ref 0.61–1.24)
GFR, Estimated: >60 mL/min (ref >60)
Glucose, Bld: 102 mg/dL — ABNORMAL HIGH (ref 70–99)
Potassium: 3.8 mmol/L (ref 3.5–5.1)
Sodium: 142 mmol/L (ref 135–145)
Total Bilirubin: 0.5 mg/dL (ref 0.3–1.2)
Total Protein: 7.2 g/dL (ref 6.5–8.1)
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
## 2021-05-21 ENCOUNTER — Other Ambulatory Visit (HOSPITAL_COMMUNITY): Payer: Self-pay

## 2021-05-21 ENCOUNTER — Telehealth: Payer: Self-pay

## 2021-05-21 ENCOUNTER — Other Ambulatory Visit: Payer: Self-pay | Admitting: *Deleted

## 2021-05-21 DIAGNOSIS — C9000 Multiple myeloma not having achieved remission: Secondary | ICD-10-CM

## 2021-05-21 LAB — KAPPA/LAMBDA LIGHT CHAINS
Kappa free light chain: 21.3 mg/L — ABNORMAL HIGH (ref 3.3–19.4)
Kappa, lambda light chain ratio: 2.24 — ABNORMAL HIGH (ref 0.26–1.65)
Lambda free light chains: 9.5 mg/L (ref 5.7–26.3)

## 2021-05-21 NOTE — Telephone Encounter (Addendum)
My Chart message from Pt stating he still is without his medication and will not receive an answer in reference to when his coverage until 06/20/21. Gave the Pt a number from [facility] that might help with assistance with medication. [phone]. Also contacted Dennison Nancy with oral chemo to see if she can help with assistance. ?

## 2021-05-22 ENCOUNTER — Telehealth: Payer: Self-pay | Admitting: Pharmacy Technician

## 2021-05-25 LAB — PROTEIN ELECTROPHORESIS, SERUM
A/G Ratio: 1.1 (ref 0.7–1.7)
Albumin ELP: 3.6 g/dL (ref 2.9–4.4)
Alpha-1-Globulin: 0.2 g/dL (ref 0.0–0.4)
Alpha-2-Globulin: 1 g/dL (ref 0.4–1.0)
Beta Globulin: 1.3 g/dL (ref 0.7–1.3)
Gamma Globulin: 0.9 g/dL (ref 0.4–1.8)
Globulin, Total: 3.4 g/dL (ref 2.2–3.9)
Total Protein ELP: 7 g/dL (ref 6.0–8.5)

## 2021-05-25 NOTE — Telephone Encounter (Incomplete)
Oral Oncology Patient Advocate Encounter  Coordinated with patient to complete application for [employer] in an effort to reduce patient's out of pocket expense for Revlimid to $0.    Application completed and faxed to ***.   *** patient assistance phone number for follow up is ***.   This encounter will be updated until final determination.

## 2021-05-27 ENCOUNTER — Other Ambulatory Visit: Payer: Self-pay | Admitting: Oncology

## 2021-05-29 ENCOUNTER — Encounter: Payer: Self-pay | Admitting: Nurse Practitioner

## 2021-06-08 NOTE — Telephone Encounter (Signed)
Oral Oncology Patient Advocate Encounter ? ?Coordinated with patient to complete application for [employer] in an effort to reduce patient's out of pocket expense for Revlimid to $0.  Emailed patient the application to electronically sign through [employer]. ? ?Application completed and faxed to [phone].  ? ?[employer] Access Support phone number for follow up is [phone].  ? ?This encounter will be updated until final determination.  ? ?Dennison Nancy CPHT ?Specialty Pharmacy Patient Advocate ?[place] ?Phone [phone] ?Fax [phone] ?06/08/2021 3:09 PM ? ?

## 2021-06-16 NOTE — Telephone Encounter (Signed)
Called to check the status of patients application and representative stated that everything was in order and let the case worker, Hoy Morn, know I had called. Rep stated we should have a benefits investigation done and summary available in the next 24 hours. ? ?Dennison Nancy CPHT ?Specialty Pharmacy Patient Advocate ?[place] ?Phone [phone] ?Fax [phone] ?06/16/2021 4:23 PM ? ?

## 2021-06-17 ENCOUNTER — Other Ambulatory Visit: Payer: Self-pay

## 2021-06-17 ENCOUNTER — Inpatient Hospital Stay (HOSPITAL_BASED_OUTPATIENT_CLINIC_OR_DEPARTMENT_OTHER): Payer: Medicare Other | Admitting: Oncology

## 2021-06-17 ENCOUNTER — Inpatient Hospital Stay: Payer: Medicare Other

## 2021-06-17 VITALS — BP 137/93 | HR 85 | Temp 97.8°F | Resp 18 | Ht 73.0 in | Wt 245.0 lb

## 2021-06-17 DIAGNOSIS — C9000 Multiple myeloma not having achieved remission: Secondary | ICD-10-CM

## 2021-06-17 LAB — CBC WITH DIFFERENTIAL (CANCER CENTER ONLY)
Abs Immature Granulocytes: 0.01 10*3/uL (ref 0.00–0.07)
Basophils Absolute: 0.1 10*3/uL (ref 0.0–0.1)
Basophils Relative: 1 %
Eosinophils Absolute: 0.1 10*3/uL (ref 0.0–0.5)
Eosinophils Relative: 1 %
HCT: 42.5 % (ref 39.0–52.0)
Hemoglobin: 14 g/dL (ref 13.0–17.0)
Immature Granulocytes: 0 %
Lymphocytes Relative: 34 %
Lymphs Abs: 1.7 10*3/uL (ref 0.7–4.0)
MCH: 25.5 pg — ABNORMAL LOW (ref 26.0–34.0)
MCHC: 32.9 g/dL (ref 30.0–36.0)
MCV: 77.6 fL — ABNORMAL LOW (ref 80.0–100.0)
Monocytes Absolute: 0.3 10*3/uL (ref 0.1–1.0)
Monocytes Relative: 6 %
Neutro Abs: 2.9 10*3/uL (ref 1.7–7.7)
Neutrophils Relative %: 58 %
Platelet Count: 206 10*3/uL (ref 150–400)
RBC: 5.48 MIL/uL (ref 4.22–5.81)
RDW: 14.9 % (ref 11.5–15.5)
WBC Count: 5 10*3/uL (ref 4.0–10.5)
nRBC: 0 % (ref 0.0–0.2)

## 2021-06-17 LAB — CMP (CANCER CENTER ONLY)
ALT: 17 U/L (ref 0–44)
AST: 14 U/L — ABNORMAL LOW (ref 15–41)
Albumin: 4.6 g/dL (ref 3.5–5.0)
Alkaline Phosphatase: 69 U/L (ref 38–126)
Anion gap: 10 (ref 5–15)
BUN: 11 mg/dL (ref 6–20)
CO2: 22 mmol/L (ref 22–32)
Calcium: 9.3 mg/dL (ref 8.9–10.3)
Chloride: 107 mmol/L (ref 98–111)
Creatinine: 1.38 mg/dL — ABNORMAL HIGH (ref 0.61–1.24)
GFR, Estimated: >60 mL/min (ref >60)
Glucose, Bld: 120 mg/dL — ABNORMAL HIGH (ref 70–99)
Potassium: 4.1 mmol/L (ref 3.5–5.1)
Sodium: 139 mmol/L (ref 135–145)
Total Bilirubin: 0.6 mg/dL (ref 0.3–1.2)
Total Protein: 7.4 g/dL (ref 6.5–8.1)

## 2021-06-17 MED ORDER — HYDROMORPHONE HCL 4 MG PO TABS: 4.0000 mg | ORAL_TABLET | ORAL | 0 refills | Status: DC | PRN

## 2021-06-17 MED ORDER — CYCLOBENZAPRINE HCL 5 MG PO TABS: ORAL_TABLET | ORAL | 0 refills | Status: DC

## 2021-06-17 MED ORDER — VITAMIN D (ERGOCALCIFEROL) 1.25 MG (50000 UNIT) PO CAPS: 50000.0000 [IU] | ORAL_CAPSULE | ORAL | 2 refills | Status: DC
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
## 2021-06-18 LAB — KAPPA/LAMBDA LIGHT CHAINS
Kappa free light chain: 20.4 mg/L — ABNORMAL HIGH (ref 3.3–19.4)
Kappa, lambda light chain ratio: 2.46 — ABNORMAL HIGH (ref 0.26–1.65)
Lambda free light chains: 8.3 mg/L (ref 5.7–26.3)

## 2021-06-19 LAB — PROTEIN ELECTROPHORESIS, SERUM
A/G Ratio: 1.1 (ref 0.7–1.7)
Albumin ELP: 3.5 g/dL (ref 2.9–4.4)
Alpha-1-Globulin: 0.2 g/dL (ref 0.0–0.4)
Alpha-2-Globulin: 0.9 g/dL (ref 0.4–1.0)
Beta Globulin: 1.3 g/dL (ref 0.7–1.3)
Gamma Globulin: 0.8 g/dL (ref 0.4–1.8)
Globulin, Total: 3.2 g/dL (ref 2.2–3.9)
Total Protein ELP: 6.7 g/dL (ref 6.0–8.5)

## 2021-06-19 NOTE — Telephone Encounter (Signed)
Hoy Morn from [facility] called to inform me that Mr Fregia would be referred to [facility] [place] since the benefits investigation showed that he did not have any pharmacy insurance.  I will follow up early next week if I have not received a determination. ? ?Dennison Nancy CPHT ?Specialty Pharmacy Patient Advocate ?[place] ?Phone [phone] ?Fax [phone] ?06/19/2021 2:58 PM ? ?

## 2021-06-23 ENCOUNTER — Other Ambulatory Visit: Payer: Self-pay | Admitting: *Deleted

## 2021-06-23 MED ORDER — REVLIMID 10 MG PO CAPS: 10.0000 mg | ORAL_CAPSULE | Freq: Every day | ORAL | 0 refills | Status: DC

## 2021-06-23 MED ORDER — REVLIMID 10 MG PO CAPS
10.0000 mg | ORAL_CAPSULE | Freq: Every day | ORAL | 0 refills | Status: DC
Start: 2021-06-23 — End: 2021-07-21

## 2021-06-23 NOTE — Progress Notes (Signed)
Revlimid script sent to [facility] per request of oral oncology program. He has qualified for PAP.  ?

## 2021-06-23 NOTE — Telephone Encounter (Signed)
Oral Oncology Patient Advocate Encounter ? ?Received notification from [employer] Patient Assistance Foundationthat patient has been successfully enrolled into their program to receive Revlimid from the manufacturer at $0 out of pocket until 03/21/22.  ?  ?I called and left a voicemail for the patient.  ? ?Specialty Pharmacy that will dispense medication is [facility]. ? ?Patient knows to call the office with questions or concerns. ?  ?Oral Oncology Clinic will continue to follow. ? ?Dennison Nancy CPHT ?Specialty Pharmacy Patient Advocate ?[place] ?Phone [phone] ?Fax [phone] ?06/23/2021 10:14 AM ? ?

## 2021-07-02 ENCOUNTER — Other Ambulatory Visit: Payer: Self-pay | Admitting: Oncology

## 2021-07-13 ENCOUNTER — Encounter: Payer: Self-pay | Admitting: Nurse Practitioner

## 2021-07-20 ENCOUNTER — Encounter: Payer: Self-pay | Admitting: *Deleted

## 2021-07-20 ENCOUNTER — Inpatient Hospital Stay: Payer: Medicare Other | Admitting: Nurse Practitioner

## 2021-07-20 ENCOUNTER — Encounter: Payer: Self-pay | Admitting: Nurse Practitioner

## 2021-07-20 ENCOUNTER — Inpatient Hospital Stay: Payer: Medicare Other

## 2021-07-20 ENCOUNTER — Inpatient Hospital Stay: Payer: Medicare Other | Attending: Oncology

## 2021-07-20 VITALS — BP 134/90 | HR 87 | Temp 98.1°F | Resp 18 | Ht 73.0 in | Wt 250.5 lb

## 2021-07-20 DIAGNOSIS — Z86718 Personal history of other venous thrombosis and embolism: Secondary | ICD-10-CM | POA: Insufficient documentation

## 2021-07-20 DIAGNOSIS — R0789 Other chest pain: Secondary | ICD-10-CM | POA: Diagnosis not present

## 2021-07-20 DIAGNOSIS — M549 Dorsalgia, unspecified: Secondary | ICD-10-CM | POA: Insufficient documentation

## 2021-07-20 DIAGNOSIS — C9000 Multiple myeloma not having achieved remission: Secondary | ICD-10-CM

## 2021-07-20 DIAGNOSIS — R21 Rash and other nonspecific skin eruption: Secondary | ICD-10-CM | POA: Insufficient documentation

## 2021-07-20 DIAGNOSIS — D649 Anemia, unspecified: Secondary | ICD-10-CM | POA: Diagnosis not present

## 2021-07-20 DIAGNOSIS — R531 Weakness: Secondary | ICD-10-CM | POA: Diagnosis not present

## 2021-07-20 DIAGNOSIS — I10 Essential (primary) hypertension: Secondary | ICD-10-CM | POA: Insufficient documentation

## 2021-07-20 LAB — CMP (CANCER CENTER ONLY)
ALT: 18 U/L (ref 0–44)
AST: 17 U/L (ref 15–41)
Albumin: 4.3 g/dL (ref 3.5–5.0)
Alkaline Phosphatase: 57 U/L (ref 38–126)
Anion gap: 11 (ref 5–15)
BUN: 10 mg/dL (ref 6–20)
CO2: 22 mmol/L (ref 22–32)
Calcium: 9.6 mg/dL (ref 8.9–10.3)
Chloride: 106 mmol/L (ref 98–111)
Creatinine: 1.25 mg/dL — ABNORMAL HIGH (ref 0.61–1.24)
GFR, Estimated: >60 mL/min (ref >60)
Glucose, Bld: 116 mg/dL — ABNORMAL HIGH (ref 70–99)
Potassium: 3.6 mmol/L (ref 3.5–5.1)
Sodium: 139 mmol/L (ref 135–145)
Total Bilirubin: 0.8 mg/dL (ref 0.3–1.2)
Total Protein: 6.9 g/dL (ref 6.5–8.1)

## 2021-07-20 LAB — CBC WITH DIFFERENTIAL (CANCER CENTER ONLY)
Abs Immature Granulocytes: 0.02 10*3/uL (ref 0.00–0.07)
Basophils Absolute: 0.1 10*3/uL (ref 0.0–0.1)
Basophils Relative: 1 %
Eosinophils Absolute: 0.1 10*3/uL (ref 0.0–0.5)
Eosinophils Relative: 3 %
HCT: 40.6 % (ref 39.0–52.0)
Hemoglobin: 13.2 g/dL (ref 13.0–17.0)
Immature Granulocytes: 0 %
Lymphocytes Relative: 25 %
Lymphs Abs: 1.1 10*3/uL (ref 0.7–4.0)
MCH: 25 pg — ABNORMAL LOW (ref 26.0–34.0)
MCHC: 32.5 g/dL (ref 30.0–36.0)
MCV: 76.7 fL — ABNORMAL LOW (ref 80.0–100.0)
Monocytes Absolute: 0.4 10*3/uL (ref 0.1–1.0)
Monocytes Relative: 9 %
Neutro Abs: 2.7 10*3/uL (ref 1.7–7.7)
Neutrophils Relative %: 62 %
Platelet Count: 200 10*3/uL (ref 150–400)
RBC: 5.29 MIL/uL (ref 4.22–5.81)
RDW: 14.3 % (ref 11.5–15.5)
WBC Count: 4.5 10*3/uL (ref 4.0–10.5)
nRBC: 0 % (ref 0.0–0.2)

## 2021-07-20 MED ORDER — SODIUM CHLORIDE 0.9 % IV SOLN: Freq: Once | INTRAVENOUS | Status: AC

## 2021-07-20 MED ORDER — ZOLEDRONIC ACID 4 MG/100ML IV SOLN
4.0000 mg | Freq: Once | INTRAVENOUS | Status: AC
  Administered 2021-07-20: 4 mg via INTRAVENOUS
  Filled 2021-07-20: qty 100

## 2021-07-20 MED ORDER — DIPHENOXYLATE-ATROPINE 2.5-0.025 MG PO TABS: ORAL_TABLET | ORAL | 0 refills | Status: DC

## 2021-07-20 NOTE — Patient Instructions (Signed)
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
## 2021-07-20 NOTE — Progress Notes (Unsigned)
Patient seen by [name] NP today ? ?Vitals are  BP elevated at 135/95--re checked manually at 134/90 ? ?Labs reviewed by [name] NP and are within treatment parameters. ? ?Per physician team, patient is ready for treatment and there are NO modifications to the treatment plan.  ?

## 2021-07-21 ENCOUNTER — Other Ambulatory Visit: Payer: Self-pay | Admitting: *Deleted

## 2021-07-21 LAB — PROTEIN ELECTROPHORESIS, SERUM
A/G Ratio: 1.2 (ref 0.7–1.7)
Albumin ELP: 3.6 g/dL (ref 2.9–4.4)
Alpha-1-Globulin: 0.2 g/dL (ref 0.0–0.4)
Alpha-2-Globulin: 0.8 g/dL (ref 0.4–1.0)
Beta Globulin: 1.2 g/dL (ref 0.7–1.3)
Gamma Globulin: 0.9 g/dL (ref 0.4–1.8)
Globulin, Total: 3 g/dL (ref 2.2–3.9)
Total Protein ELP: 6.6 g/dL (ref 6.0–8.5)

## 2021-07-21 LAB — KAPPA/LAMBDA LIGHT CHAINS
Kappa free light chain: 30.7 mg/L — ABNORMAL HIGH (ref 3.3–19.4)
Kappa, lambda light chain ratio: 2.44 — ABNORMAL HIGH (ref 0.26–1.65)
Lambda free light chains: 12.6 mg/L (ref 5.7–26.3)

## 2021-07-21 MED ORDER — REVLIMID 10 MG PO CAPS
10.0000 mg | ORAL_CAPSULE | Freq: Every day | ORAL | 0 refills | Status: DC
Start: 2021-07-21 — End: 2021-08-18

## 2021-08-18 ENCOUNTER — Inpatient Hospital Stay: Payer: Medicare Other | Admitting: Oncology

## 2021-08-18 ENCOUNTER — Inpatient Hospital Stay: Payer: Medicare Other

## 2021-08-18 VITALS — BP 124/89 | HR 86 | Temp 98.2°F | Resp 18 | Ht 73.0 in | Wt 242.8 lb

## 2021-08-18 DIAGNOSIS — C9000 Multiple myeloma not having achieved remission: Secondary | ICD-10-CM

## 2021-08-18 LAB — CMP (CANCER CENTER ONLY)
ALT: 36 U/L (ref 0–44)
AST: 27 U/L (ref 15–41)
Albumin: 4.5 g/dL (ref 3.5–5.0)
Alkaline Phosphatase: 56 U/L (ref 38–126)
Anion gap: 10 (ref 5–15)
BUN: 14 mg/dL (ref 6–20)
CO2: 21 mmol/L — ABNORMAL LOW (ref 22–32)
Calcium: 9.3 mg/dL (ref 8.9–10.3)
Chloride: 108 mmol/L (ref 98–111)
Creatinine: 1.44 mg/dL — ABNORMAL HIGH (ref 0.61–1.24)
GFR, Estimated: >60 mL/min (ref >60)
Glucose, Bld: 106 mg/dL — ABNORMAL HIGH (ref 70–99)
Potassium: 4 mmol/L (ref 3.5–5.1)
Sodium: 139 mmol/L (ref 135–145)
Total Bilirubin: 0.7 mg/dL (ref 0.3–1.2)
Total Protein: 7.3 g/dL (ref 6.5–8.1)

## 2021-08-18 LAB — CBC WITH DIFFERENTIAL (CANCER CENTER ONLY)
Abs Immature Granulocytes: 0.02 10*3/uL (ref 0.00–0.07)
Basophils Absolute: 0.1 10*3/uL (ref 0.0–0.1)
Basophils Relative: 3 %
Eosinophils Absolute: 0.2 10*3/uL (ref 0.0–0.5)
Eosinophils Relative: 5 %
HCT: 42.4 % (ref 39.0–52.0)
Hemoglobin: 13.6 g/dL (ref 13.0–17.0)
Immature Granulocytes: 1 %
Lymphocytes Relative: 29 %
Lymphs Abs: 1.2 10*3/uL (ref 0.7–4.0)
MCH: 25.1 pg — ABNORMAL LOW (ref 26.0–34.0)
MCHC: 32.1 g/dL (ref 30.0–36.0)
MCV: 78.4 fL — ABNORMAL LOW (ref 80.0–100.0)
Monocytes Absolute: 0.4 10*3/uL (ref 0.1–1.0)
Monocytes Relative: 10 %
Neutro Abs: 2.1 10*3/uL (ref 1.7–7.7)
Neutrophils Relative %: 52 %
Platelet Count: 178 10*3/uL (ref 150–400)
RBC: 5.41 MIL/uL (ref 4.22–5.81)
RDW: 13.8 % (ref 11.5–15.5)
WBC Count: 4 10*3/uL (ref 4.0–10.5)
nRBC: 0 % (ref 0.0–0.2)
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
## 2021-08-18 NOTE — Addendum Note (Signed)
Addended by: Doristine Section L on: 08/18/2021 11:32 AM   Modules accepted: Orders

## 2021-08-19 LAB — KAPPA/LAMBDA LIGHT CHAINS
Kappa free light chain: 33.8 mg/L — ABNORMAL HIGH (ref 3.3–19.4)
Kappa, lambda light chain ratio: 2.43 — ABNORMAL HIGH (ref 0.26–1.65)
Lambda free light chains: 13.9 mg/L (ref 5.7–26.3)

## 2021-08-20 LAB — PROTEIN ELECTROPHORESIS, SERUM
A/G Ratio: 1.3 (ref 0.7–1.7)
Albumin ELP: 3.7 g/dL (ref 2.9–4.4)
Alpha-1-Globulin: 0.2 g/dL (ref 0.0–0.4)
Alpha-2-Globulin: 0.7 g/dL (ref 0.4–1.0)
Beta Globulin: 1.1 g/dL (ref 0.7–1.3)
Gamma Globulin: 0.9 g/dL (ref 0.4–1.8)
Globulin, Total: 2.9 g/dL (ref 2.2–3.9)
Total Protein ELP: 6.6 g/dL (ref 6.0–8.5)

## 2021-08-21 ENCOUNTER — Other Ambulatory Visit: Payer: Self-pay | Admitting: *Deleted

## 2021-08-21 MED ORDER — LENALIDOMIDE 10 MG PO CAPS: 10.0000 mg | ORAL_CAPSULE | Freq: Every day | ORAL | 0 refills | Status: DC

## 2021-09-14 ENCOUNTER — Other Ambulatory Visit: Payer: Self-pay | Admitting: Oncology

## 2021-09-14 DIAGNOSIS — C9000 Multiple myeloma not having achieved remission: Secondary | ICD-10-CM

## 2021-09-16 NOTE — Telephone Encounter (Signed)
Hold appointment on 09/21/21

## 2021-09-21 ENCOUNTER — Other Ambulatory Visit: Payer: Self-pay | Admitting: *Deleted

## 2021-09-21 ENCOUNTER — Inpatient Hospital Stay: Payer: Medicare Other | Attending: Oncology

## 2021-09-21 ENCOUNTER — Encounter: Payer: Self-pay | Admitting: Nurse Practitioner

## 2021-09-21 ENCOUNTER — Inpatient Hospital Stay: Payer: Medicare Other | Admitting: Nurse Practitioner

## 2021-09-21 VITALS — BP 145/90 | HR 70 | Temp 98.2°F | Resp 18 | Ht 73.0 in | Wt 244.0 lb

## 2021-09-21 DIAGNOSIS — R1310 Dysphagia, unspecified: Secondary | ICD-10-CM | POA: Diagnosis not present

## 2021-09-21 DIAGNOSIS — R21 Rash and other nonspecific skin eruption: Secondary | ICD-10-CM | POA: Diagnosis not present

## 2021-09-21 DIAGNOSIS — I10 Essential (primary) hypertension: Secondary | ICD-10-CM | POA: Diagnosis not present

## 2021-09-21 DIAGNOSIS — D649 Anemia, unspecified: Secondary | ICD-10-CM | POA: Diagnosis not present

## 2021-09-21 DIAGNOSIS — M549 Dorsalgia, unspecified: Secondary | ICD-10-CM | POA: Insufficient documentation

## 2021-09-21 DIAGNOSIS — N19 Unspecified kidney failure: Secondary | ICD-10-CM | POA: Insufficient documentation

## 2021-09-21 DIAGNOSIS — C9000 Multiple myeloma not having achieved remission: Secondary | ICD-10-CM

## 2021-09-21 DIAGNOSIS — Z86718 Personal history of other venous thrombosis and embolism: Secondary | ICD-10-CM | POA: Insufficient documentation

## 2021-09-21 DIAGNOSIS — R0789 Other chest pain: Secondary | ICD-10-CM | POA: Diagnosis not present

## 2021-09-21 DIAGNOSIS — R252 Cramp and spasm: Secondary | ICD-10-CM | POA: Diagnosis not present

## 2021-09-21 LAB — CBC WITH DIFFERENTIAL (CANCER CENTER ONLY)
Abs Immature Granulocytes: 0 10*3/uL (ref 0.00–0.07)
Basophils Absolute: 0.1 10*3/uL (ref 0.0–0.1)
Basophils Relative: 4 %
Eosinophils Absolute: 0.1 10*3/uL (ref 0.0–0.5)
Eosinophils Relative: 2 %
HCT: 41.7 % (ref 39.0–52.0)
Hemoglobin: 13.3 g/dL (ref 13.0–17.0)
Immature Granulocytes: 0 %
Lymphocytes Relative: 38 %
Lymphs Abs: 1.5 10*3/uL (ref 0.7–4.0)
MCH: 24.7 pg — ABNORMAL LOW (ref 26.0–34.0)
MCHC: 31.9 g/dL (ref 30.0–36.0)
MCV: 77.4 fL — ABNORMAL LOW (ref 80.0–100.0)
Monocytes Absolute: 0.5 10*3/uL (ref 0.1–1.0)
Monocytes Relative: 12 %
Neutro Abs: 1.8 10*3/uL (ref 1.7–7.7)
Neutrophils Relative %: 44 %
Platelet Count: 206 10*3/uL (ref 150–400)
RBC: 5.39 MIL/uL (ref 4.22–5.81)
RDW: 14 % (ref 11.5–15.5)
WBC Count: 4 10*3/uL (ref 4.0–10.5)
nRBC: 0 % (ref 0.0–0.2)

## 2021-09-21 LAB — CMP (CANCER CENTER ONLY)
ALT: 23 U/L (ref 0–44)
AST: 17 U/L (ref 15–41)
Albumin: 4.5 g/dL (ref 3.5–5.0)
Alkaline Phosphatase: 48 U/L (ref 38–126)
Anion gap: 11 (ref 5–15)
BUN: 12 mg/dL (ref 6–20)
CO2: 22 mmol/L (ref 22–32)
Calcium: 9.3 mg/dL (ref 8.9–10.3)
Chloride: 107 mmol/L (ref 98–111)
Creatinine: 1.27 mg/dL — ABNORMAL HIGH (ref 0.61–1.24)
GFR, Estimated: >60 mL/min (ref >60)
Glucose, Bld: 119 mg/dL — ABNORMAL HIGH (ref 70–99)
Potassium: 4.1 mmol/L (ref 3.5–5.1)
Sodium: 140 mmol/L (ref 135–145)
Total Bilirubin: 0.5 mg/dL (ref 0.3–1.2)
Total Protein: 7.3 g/dL (ref 6.5–8.1)

## 2021-09-21 MED ORDER — DIPHENOXYLATE-ATROPINE 2.5-0.025 MG PO TABS: ORAL_TABLET | ORAL | 0 refills | Status: DC

## 2021-09-21 MED ORDER — VITAMIN D (ERGOCALCIFEROL) 1.25 MG (50000 UNIT) PO CAPS: 50000.0000 [IU] | ORAL_CAPSULE | ORAL | 2 refills | Status: DC

## 2021-09-21 MED ORDER — LENALIDOMIDE 5 MG PO CAPS: 5.0000 mg | ORAL_CAPSULE | Freq: Every day | ORAL | 0 refills | Status: DC
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
## 2021-09-23 LAB — KAPPA/LAMBDA LIGHT CHAINS
Kappa free light chain: 25.7 mg/L — ABNORMAL HIGH (ref 3.3–19.4)
Kappa, lambda light chain ratio: 2.32 — ABNORMAL HIGH (ref 0.26–1.65)
Lambda free light chains: 11.1 mg/L (ref 5.7–26.3)

## 2021-09-24 LAB — PROTEIN ELECTROPHORESIS, SERUM
A/G Ratio: 1.2 (ref 0.7–1.7)
Albumin ELP: 3.7 g/dL (ref 2.9–4.4)
Alpha-1-Globulin: 0.2 g/dL (ref 0.0–0.4)
Alpha-2-Globulin: 0.8 g/dL (ref 0.4–1.0)
Beta Globulin: 1.2 g/dL (ref 0.7–1.3)
Gamma Globulin: 0.9 g/dL (ref 0.4–1.8)
Globulin, Total: 3.1 g/dL (ref 2.2–3.9)
Total Protein ELP: 6.8 g/dL (ref 6.0–8.5)

## 2021-09-25 ENCOUNTER — Telehealth: Payer: Self-pay | Admitting: *Deleted

## 2021-09-25 NOTE — Telephone Encounter (Signed)
[name] notified of bone survey at [facility] radiology on 09/29/21 at 1030 with 1015 arrival. No prep.

## 2021-09-29 ENCOUNTER — Ambulatory Visit (HOSPITAL_BASED_OUTPATIENT_CLINIC_OR_DEPARTMENT_OTHER)
Admission: RE | Admit: 2021-09-29 | Discharge: 2021-09-29 | Disposition: A | Discharge status: Home / Self Care | Payer: Medicare Other | Source: Ambulatory Visit | Attending: Nurse Practitioner | Admitting: Nurse Practitioner

## 2021-09-29 DIAGNOSIS — C9000 Multiple myeloma not having achieved remission: Secondary | ICD-10-CM | POA: Diagnosis present

## 2021-10-19 ENCOUNTER — Ambulatory Visit: Payer: Medicare Other

## 2021-10-19 ENCOUNTER — Inpatient Hospital Stay: Payer: Medicare Other | Admitting: Oncology

## 2021-10-19 ENCOUNTER — Other Ambulatory Visit: Payer: Self-pay | Admitting: *Deleted

## 2021-10-19 ENCOUNTER — Inpatient Hospital Stay: Payer: Medicare Other

## 2021-10-19 ENCOUNTER — Encounter: Payer: Self-pay | Admitting: *Deleted

## 2021-10-19 VITALS — BP 130/89 | HR 78 | Temp 98.2°F | Resp 18 | Ht 73.0 in | Wt 241.6 lb

## 2021-10-19 VITALS — BP 126/89 | HR 65

## 2021-10-19 DIAGNOSIS — C9000 Multiple myeloma not having achieved remission: Secondary | ICD-10-CM

## 2021-10-19 LAB — CBC WITH DIFFERENTIAL (CANCER CENTER ONLY)
Abs Immature Granulocytes: 0.01 10*3/uL (ref 0.00–0.07)
Basophils Absolute: 0.1 10*3/uL (ref 0.0–0.1)
Basophils Relative: 2 %
Eosinophils Absolute: 0.1 10*3/uL (ref 0.0–0.5)
Eosinophils Relative: 2 %
HCT: 41.8 % (ref 39.0–52.0)
Hemoglobin: 14 g/dL (ref 13.0–17.0)
Immature Granulocytes: 0 %
Lymphocytes Relative: 37 %
Lymphs Abs: 1.4 10*3/uL (ref 0.7–4.0)
MCH: 25.6 pg — ABNORMAL LOW (ref 26.0–34.0)
MCHC: 33.5 g/dL (ref 30.0–36.0)
MCV: 76.6 fL — ABNORMAL LOW (ref 80.0–100.0)
Monocytes Absolute: 0.4 10*3/uL (ref 0.1–1.0)
Monocytes Relative: 10 %
Neutro Abs: 1.8 10*3/uL (ref 1.7–7.7)
Neutrophils Relative %: 49 %
Platelet Count: 189 10*3/uL (ref 150–400)
RBC: 5.46 MIL/uL (ref 4.22–5.81)
RDW: 14.3 % (ref 11.5–15.5)
WBC Count: 3.8 10*3/uL — ABNORMAL LOW (ref 4.0–10.5)
nRBC: 0 % (ref 0.0–0.2)

## 2021-10-19 LAB — CMP (CANCER CENTER ONLY)
ALT: 34 U/L (ref 0–44)
AST: 26 U/L (ref 15–41)
Albumin: 4.5 g/dL (ref 3.5–5.0)
Alkaline Phosphatase: 52 U/L (ref 38–126)
Anion gap: 10 (ref 5–15)
BUN: 12 mg/dL (ref 6–20)
CO2: 23 mmol/L (ref 22–32)
Calcium: 9.4 mg/dL (ref 8.9–10.3)
Chloride: 107 mmol/L (ref 98–111)
Creatinine: 1.34 mg/dL — ABNORMAL HIGH (ref 0.61–1.24)
GFR, Estimated: >60 mL/min (ref >60)
Glucose, Bld: 104 mg/dL — ABNORMAL HIGH (ref 70–99)
Potassium: 3.9 mmol/L (ref 3.5–5.1)
Sodium: 140 mmol/L (ref 135–145)
Total Bilirubin: 0.6 mg/dL (ref 0.3–1.2)
Total Protein: 6.8 g/dL (ref 6.5–8.1)

## 2021-10-19 LAB — TSH: TSH: 1.67 u[IU]/mL (ref 0.350–4.500)

## 2021-10-19 LAB — MAGNESIUM: Magnesium: 1.7 mg/dL (ref 1.7–2.4)

## 2021-10-19 MED ORDER — ZOLEDRONIC ACID 4 MG/100ML IV SOLN
4.0000 mg | Freq: Once | INTRAVENOUS | Status: AC
  Administered 2021-10-19: 4 mg via INTRAVENOUS
  Filled 2021-10-19: qty 100

## 2021-10-19 MED ORDER — LENALIDOMIDE 5 MG PO CAPS: 5.0000 mg | ORAL_CAPSULE | Freq: Every day | ORAL | 0 refills | Status: DC

## 2021-10-19 MED ORDER — SODIUM CHLORIDE 0.9 % IV SOLN: Freq: Once | INTRAVENOUS | Status: AC

## 2021-10-19 NOTE — Patient Instructions (Signed)
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
## 2021-10-19 NOTE — Progress Notes (Signed)
Patient seen by Dr. Sherrill today ? ?Vitals are within treatment parameters. ? ?Labs reviewed by Dr. Sherrill and are within treatment parameters. ? ?Per physician team, patient is ready for treatment and there are NO modifications to the treatment plan.  ?

## 2021-10-20 LAB — KAPPA/LAMBDA LIGHT CHAINS
Kappa free light chain: 28.9 mg/L — ABNORMAL HIGH (ref 3.3–19.4)
Kappa, lambda light chain ratio: 2.75 — ABNORMAL HIGH (ref 0.26–1.65)
Lambda free light chains: 10.5 mg/L (ref 5.7–26.3)

## 2021-10-21 LAB — PROTEIN ELECTROPHORESIS, SERUM
A/G Ratio: 1.3 (ref 0.7–1.7)
Albumin ELP: 3.7 g/dL (ref 2.9–4.4)
Alpha-1-Globulin: 0.2 g/dL (ref 0.0–0.4)
Alpha-2-Globulin: 0.7 g/dL (ref 0.4–1.0)
Beta Globulin: 1.1 g/dL (ref 0.7–1.3)
Gamma Globulin: 0.9 g/dL (ref 0.4–1.8)
Globulin, Total: 2.9 g/dL (ref 2.2–3.9)
M-Spike, %: 0.3 g/dL — ABNORMAL HIGH
Total Protein ELP: 6.6 g/dL (ref 6.0–8.5)

## 2021-11-19 ENCOUNTER — Telehealth: Payer: Self-pay

## 2021-11-19 ENCOUNTER — Inpatient Hospital Stay: Payer: Medicare Other | Admitting: Nurse Practitioner

## 2021-11-19 ENCOUNTER — Encounter: Payer: Self-pay | Admitting: Nurse Practitioner

## 2021-11-19 ENCOUNTER — Inpatient Hospital Stay: Payer: Medicare Other | Attending: Oncology

## 2021-11-19 ENCOUNTER — Other Ambulatory Visit: Payer: Self-pay | Admitting: *Deleted

## 2021-11-19 ENCOUNTER — Other Ambulatory Visit (HOSPITAL_COMMUNITY): Payer: Self-pay

## 2021-11-19 VITALS — BP 153/90 | HR 72 | Temp 98.2°F | Resp 18 | Ht 73.0 in | Wt 240.0 lb

## 2021-11-19 DIAGNOSIS — Z7901 Long term (current) use of anticoagulants: Secondary | ICD-10-CM | POA: Insufficient documentation

## 2021-11-19 DIAGNOSIS — D709 Neutropenia, unspecified: Secondary | ICD-10-CM | POA: Diagnosis not present

## 2021-11-19 DIAGNOSIS — R0789 Other chest pain: Secondary | ICD-10-CM | POA: Diagnosis not present

## 2021-11-19 DIAGNOSIS — C9000 Multiple myeloma not having achieved remission: Secondary | ICD-10-CM

## 2021-11-19 DIAGNOSIS — Z86718 Personal history of other venous thrombosis and embolism: Secondary | ICD-10-CM | POA: Insufficient documentation

## 2021-11-19 DIAGNOSIS — M549 Dorsalgia, unspecified: Secondary | ICD-10-CM | POA: Diagnosis not present

## 2021-11-19 DIAGNOSIS — N19 Unspecified kidney failure: Secondary | ICD-10-CM | POA: Insufficient documentation

## 2021-11-19 LAB — CMP (CANCER CENTER ONLY)
ALT: 16 U/L (ref 0–44)
AST: 16 U/L (ref 15–41)
Albumin: 4.5 g/dL (ref 3.5–5.0)
Alkaline Phosphatase: 51 U/L (ref 38–126)
Anion gap: 8 (ref 5–15)
BUN: 11 mg/dL (ref 6–20)
CO2: 22 mmol/L (ref 22–32)
Calcium: 9.3 mg/dL (ref 8.9–10.3)
Chloride: 108 mmol/L (ref 98–111)
Creatinine: 1.21 mg/dL (ref 0.61–1.24)
GFR, Estimated: >60 mL/min (ref >60)
Glucose, Bld: 96 mg/dL (ref 70–99)
Potassium: 3.7 mmol/L (ref 3.5–5.1)
Sodium: 138 mmol/L (ref 135–145)
Total Bilirubin: 0.6 mg/dL (ref 0.3–1.2)
Total Protein: 7 g/dL (ref 6.5–8.1)

## 2021-11-19 LAB — CBC WITH DIFFERENTIAL (CANCER CENTER ONLY)
Abs Immature Granulocytes: 0 10*3/uL (ref 0.00–0.07)
Basophils Absolute: 0.1 10*3/uL (ref 0.0–0.1)
Basophils Relative: 4 %
Eosinophils Absolute: 0.1 10*3/uL (ref 0.0–0.5)
Eosinophils Relative: 1 %
HCT: 41.6 % (ref 39.0–52.0)
Hemoglobin: 13.7 g/dL (ref 13.0–17.0)
Immature Granulocytes: 0 %
Lymphocytes Relative: 45 %
Lymphs Abs: 1.6 10*3/uL (ref 0.7–4.0)
MCH: 25.1 pg — ABNORMAL LOW (ref 26.0–34.0)
MCHC: 32.9 g/dL (ref 30.0–36.0)
MCV: 76.2 fL — ABNORMAL LOW (ref 80.0–100.0)
Monocytes Absolute: 0.5 10*3/uL (ref 0.1–1.0)
Monocytes Relative: 14 %
Neutro Abs: 1.2 10*3/uL — ABNORMAL LOW (ref 1.7–7.7)
Neutrophils Relative %: 36 %
Platelet Count: 235 10*3/uL (ref 150–400)
RBC: 5.46 MIL/uL (ref 4.22–5.81)
RDW: 14.9 % (ref 11.5–15.5)
WBC Count: 3.5 10*3/uL — ABNORMAL LOW (ref 4.0–10.5)
nRBC: 0 % (ref 0.0–0.2)

## 2021-11-19 LAB — MAGNESIUM: Magnesium: 1.7 mg/dL (ref 1.7–2.4)

## 2021-11-19 MED ORDER — LENALIDOMIDE 5 MG PO CAPS: 5.0000 mg | ORAL_CAPSULE | Freq: Every day | ORAL | 0 refills | Status: DC
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
## 2021-11-19 NOTE — Telephone Encounter (Signed)
Patient is schedule for Ct Scan on 9/9 at 245. He is aware of his appointment.

## 2021-11-20 LAB — KAPPA/LAMBDA LIGHT CHAINS
Kappa free light chain: 22.2 mg/L — ABNORMAL HIGH (ref 3.3–19.4)
Kappa, lambda light chain ratio: 1.96 — ABNORMAL HIGH (ref 0.26–1.65)
Lambda free light chains: 11.3 mg/L (ref 5.7–26.3)

## 2021-11-24 ENCOUNTER — Telehealth: Payer: Self-pay | Admitting: Oncology

## 2021-11-24 LAB — PROTEIN ELECTROPHORESIS, SERUM
A/G Ratio: 1.4 (ref 0.7–1.7)
Albumin ELP: 3.9 g/dL (ref 2.9–4.4)
Alpha-1-Globulin: 0.2 g/dL (ref 0.0–0.4)
Alpha-2-Globulin: 0.7 g/dL (ref 0.4–1.0)
Beta Globulin: 1.1 g/dL (ref 0.7–1.3)
Gamma Globulin: 0.8 g/dL (ref 0.4–1.8)
Globulin, Total: 2.8 g/dL (ref 2.2–3.9)
Total Protein ELP: 6.7 g/dL (ref 6.0–8.5)

## 2021-11-24 NOTE — Telephone Encounter (Signed)
Attempted to contact patient in regards to rescheduling 10/3 appointments, trying to see if we can add in chemo patient on day

## 2021-11-28 ENCOUNTER — Other Ambulatory Visit: Payer: Self-pay | Admitting: Nurse Practitioner

## 2021-11-28 ENCOUNTER — Ambulatory Visit (HOSPITAL_BASED_OUTPATIENT_CLINIC_OR_DEPARTMENT_OTHER)
Admission: RE | Admit: 2021-11-28 | Discharge: 2021-11-28 | Disposition: A | Discharge status: Home / Self Care | Payer: Medicare Other | Source: Ambulatory Visit | Attending: Nurse Practitioner | Admitting: Nurse Practitioner

## 2021-11-28 DIAGNOSIS — C9000 Multiple myeloma not having achieved remission: Secondary | ICD-10-CM

## 2021-11-30 ENCOUNTER — Encounter: Payer: Self-pay | Admitting: Nurse Practitioner

## 2021-12-22 ENCOUNTER — Other Ambulatory Visit: Payer: Medicare Other

## 2021-12-22 ENCOUNTER — Ambulatory Visit: Payer: Medicare Other | Admitting: Oncology

## 2021-12-24 ENCOUNTER — Inpatient Hospital Stay: Payer: Medicare Other | Admitting: Oncology

## 2021-12-24 ENCOUNTER — Inpatient Hospital Stay: Payer: Medicare Other | Attending: Oncology

## 2021-12-24 VITALS — BP 135/96 | HR 71 | Temp 98.1°F | Resp 18 | Ht 73.0 in | Wt 234.8 lb

## 2021-12-24 DIAGNOSIS — C9000 Multiple myeloma not having achieved remission: Secondary | ICD-10-CM

## 2021-12-24 DIAGNOSIS — R21 Rash and other nonspecific skin eruption: Secondary | ICD-10-CM | POA: Insufficient documentation

## 2021-12-24 DIAGNOSIS — M8580 Other specified disorders of bone density and structure, unspecified site: Secondary | ICD-10-CM | POA: Insufficient documentation

## 2021-12-24 DIAGNOSIS — Z86718 Personal history of other venous thrombosis and embolism: Secondary | ICD-10-CM | POA: Diagnosis not present

## 2021-12-24 DIAGNOSIS — I10 Essential (primary) hypertension: Secondary | ICD-10-CM | POA: Diagnosis not present

## 2021-12-24 DIAGNOSIS — D649 Anemia, unspecified: Secondary | ICD-10-CM | POA: Insufficient documentation

## 2021-12-24 DIAGNOSIS — R0789 Other chest pain: Secondary | ICD-10-CM | POA: Diagnosis not present

## 2021-12-24 LAB — CBC WITH DIFFERENTIAL (CANCER CENTER ONLY)
Abs Immature Granulocytes: 0.01 10*3/uL (ref 0.00–0.07)
Basophils Absolute: 0.1 10*3/uL (ref 0.0–0.1)
Basophils Relative: 4 %
Eosinophils Absolute: 0.1 10*3/uL (ref 0.0–0.5)
Eosinophils Relative: 2 %
HCT: 39.5 % (ref 39.0–52.0)
Hemoglobin: 13.3 g/dL (ref 13.0–17.0)
Immature Granulocytes: 0 %
Lymphocytes Relative: 40 %
Lymphs Abs: 1.5 10*3/uL (ref 0.7–4.0)
MCH: 25.8 pg — ABNORMAL LOW (ref 26.0–34.0)
MCHC: 33.7 g/dL (ref 30.0–36.0)
MCV: 76.7 fL — ABNORMAL LOW (ref 80.0–100.0)
Monocytes Absolute: 0.4 10*3/uL (ref 0.1–1.0)
Monocytes Relative: 11 %
Neutro Abs: 1.6 10*3/uL — ABNORMAL LOW (ref 1.7–7.7)
Neutrophils Relative %: 43 %
Platelet Count: 331 10*3/uL (ref 150–400)
RBC: 5.15 MIL/uL (ref 4.22–5.81)
RDW: 14.5 % (ref 11.5–15.5)
WBC Count: 3.7 10*3/uL — ABNORMAL LOW (ref 4.0–10.5)
nRBC: 0 % (ref 0.0–0.2)

## 2021-12-24 LAB — CMP (CANCER CENTER ONLY)
ALT: 13 U/L (ref 0–44)
AST: 14 U/L — ABNORMAL LOW (ref 15–41)
Albumin: 4.3 g/dL (ref 3.5–5.0)
Alkaline Phosphatase: 56 U/L (ref 38–126)
Anion gap: 10 (ref 5–15)
BUN: 11 mg/dL (ref 6–20)
CO2: 23 mmol/L (ref 22–32)
Calcium: 9.2 mg/dL (ref 8.9–10.3)
Chloride: 108 mmol/L (ref 98–111)
Creatinine: 1.29 mg/dL — ABNORMAL HIGH (ref 0.61–1.24)
GFR, Estimated: >60 mL/min (ref >60)
Glucose, Bld: 99 mg/dL (ref 70–99)
Potassium: 3.7 mmol/L (ref 3.5–5.1)
Sodium: 141 mmol/L (ref 135–145)
Total Bilirubin: 0.5 mg/dL (ref 0.3–1.2)
Total Protein: 7.1 g/dL (ref 6.5–8.1)

## 2021-12-24 MED ORDER — VITAMIN D (ERGOCALCIFEROL) 1.25 MG (50000 UNIT) PO CAPS: 50000.0000 [IU] | ORAL_CAPSULE | ORAL | 2 refills | Status: DC

## 2021-12-24 MED ORDER — LENALIDOMIDE 5 MG PO CAPS: 5.0000 mg | ORAL_CAPSULE | Freq: Every day | ORAL | 0 refills | Status: DC
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
## 2021-12-25 LAB — KAPPA/LAMBDA LIGHT CHAINS
Kappa free light chain: 20.2 mg/L — ABNORMAL HIGH (ref 3.3–19.4)
Kappa, lambda light chain ratio: 2.22 — ABNORMAL HIGH (ref 0.26–1.65)
Lambda free light chains: 9.1 mg/L (ref 5.7–26.3)

## 2021-12-28 LAB — PROTEIN ELECTROPHORESIS, SERUM
A/G Ratio: 1.1 (ref 0.7–1.7)
Albumin ELP: 3.4 g/dL (ref 2.9–4.4)
Alpha-1-Globulin: 0.2 g/dL (ref 0.0–0.4)
Alpha-2-Globulin: 0.7 g/dL (ref 0.4–1.0)
Beta Globulin: 1.2 g/dL (ref 0.7–1.3)
Gamma Globulin: 1 g/dL (ref 0.4–1.8)
Globulin, Total: 3.1 g/dL (ref 2.2–3.9)
Total Protein ELP: 6.5 g/dL (ref 6.0–8.5)

## 2021-12-29 ENCOUNTER — Encounter: Payer: Self-pay | Admitting: Nurse Practitioner

## 2022-01-01 ENCOUNTER — Encounter: Payer: Self-pay | Admitting: *Deleted

## 2022-01-13 ENCOUNTER — Ambulatory Visit (HOSPITAL_COMMUNITY)
Admission: RE | Admit: 2022-01-13 | Discharge: 2022-01-13 | Disposition: A | Discharge status: Home / Self Care | Payer: Medicare Other | Source: Ambulatory Visit | Attending: Oncology | Admitting: Oncology

## 2022-01-13 ENCOUNTER — Other Ambulatory Visit: Payer: Self-pay | Admitting: Oncology

## 2022-01-13 DIAGNOSIS — C9000 Multiple myeloma not having achieved remission: Secondary | ICD-10-CM

## 2022-01-13 LAB — GLUCOSE, CAPILLARY: Glucose-Capillary: 93 mg/dL (ref 70–99)

## 2022-01-13 MED ORDER — FLUDEOXYGLUCOSE F - 18 (FDG) INJECTION
11.6200 | Freq: Once | INTRAVENOUS | Status: AC | PRN
  Administered 2022-01-13: 11.62 via INTRAVENOUS
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
## 2022-01-22 ENCOUNTER — Inpatient Hospital Stay: Payer: Medicare Other

## 2022-01-22 ENCOUNTER — Other Ambulatory Visit (HOSPITAL_COMMUNITY): Payer: Self-pay

## 2022-01-22 ENCOUNTER — Inpatient Hospital Stay: Payer: Medicare Other | Attending: Oncology | Admitting: Nurse Practitioner

## 2022-01-22 ENCOUNTER — Other Ambulatory Visit: Payer: Self-pay

## 2022-01-22 ENCOUNTER — Other Ambulatory Visit: Payer: Self-pay | Admitting: Nurse Practitioner

## 2022-01-22 ENCOUNTER — Encounter: Payer: Self-pay | Admitting: Nurse Practitioner

## 2022-01-22 VITALS — BP 121/86 | HR 90 | Temp 98.1°F | Resp 18 | Ht 73.0 in | Wt 230.4 lb

## 2022-01-22 DIAGNOSIS — I10 Essential (primary) hypertension: Secondary | ICD-10-CM | POA: Insufficient documentation

## 2022-01-22 DIAGNOSIS — C9000 Multiple myeloma not having achieved remission: Secondary | ICD-10-CM

## 2022-01-22 DIAGNOSIS — N19 Unspecified kidney failure: Secondary | ICD-10-CM | POA: Insufficient documentation

## 2022-01-22 DIAGNOSIS — R21 Rash and other nonspecific skin eruption: Secondary | ICD-10-CM | POA: Insufficient documentation

## 2022-01-22 DIAGNOSIS — Z86718 Personal history of other venous thrombosis and embolism: Secondary | ICD-10-CM | POA: Insufficient documentation

## 2022-01-22 DIAGNOSIS — D649 Anemia, unspecified: Secondary | ICD-10-CM | POA: Diagnosis not present

## 2022-01-22 LAB — CBC WITH DIFFERENTIAL (CANCER CENTER ONLY)
Abs Immature Granulocytes: 0 10*3/uL (ref 0.00–0.07)
Basophils Absolute: 0.1 10*3/uL (ref 0.0–0.1)
Basophils Relative: 1 %
Eosinophils Absolute: 0.1 10*3/uL (ref 0.0–0.5)
Eosinophils Relative: 2 %
HCT: 40 % (ref 39.0–52.0)
Hemoglobin: 13.3 g/dL (ref 13.0–17.0)
Immature Granulocytes: 0 %
Lymphocytes Relative: 41 %
Lymphs Abs: 1.5 10*3/uL (ref 0.7–4.0)
MCH: 25.5 pg — ABNORMAL LOW (ref 26.0–34.0)
MCHC: 33.3 g/dL (ref 30.0–36.0)
MCV: 76.6 fL — ABNORMAL LOW (ref 80.0–100.0)
Monocytes Absolute: 0.4 10*3/uL (ref 0.1–1.0)
Monocytes Relative: 10 %
Neutro Abs: 1.6 10*3/uL — ABNORMAL LOW (ref 1.7–7.7)
Neutrophils Relative %: 46 %
Platelet Count: 231 10*3/uL (ref 150–400)
RBC: 5.22 MIL/uL (ref 4.22–5.81)
RDW: 14.1 % (ref 11.5–15.5)
WBC Count: 3.6 10*3/uL — ABNORMAL LOW (ref 4.0–10.5)
nRBC: 0 % (ref 0.0–0.2)

## 2022-01-22 LAB — CMP (CANCER CENTER ONLY)
ALT: 14 U/L (ref 0–44)
AST: 15 U/L (ref 15–41)
Albumin: 4.5 g/dL (ref 3.5–5.0)
Alkaline Phosphatase: 50 U/L (ref 38–126)
Anion gap: 9 (ref 5–15)
BUN: 11 mg/dL (ref 6–20)
CO2: 22 mmol/L (ref 22–32)
Calcium: 9.3 mg/dL (ref 8.9–10.3)
Chloride: 109 mmol/L (ref 98–111)
Creatinine: 1.22 mg/dL (ref 0.61–1.24)
GFR, Estimated: >60 mL/min (ref >60)
Glucose, Bld: 99 mg/dL (ref 70–99)
Potassium: 3.8 mmol/L (ref 3.5–5.1)
Sodium: 140 mmol/L (ref 135–145)
Total Bilirubin: 0.6 mg/dL (ref 0.3–1.2)
Total Protein: 7.2 g/dL (ref 6.5–8.1)

## 2022-01-22 MED ORDER — LENALIDOMIDE 5 MG PO CAPS
5.0000 mg | ORAL_CAPSULE | Freq: Every day | ORAL | 0 refills | Status: DC
  Filled 2022-01-22: qty 21, 21d supply, fill #0

## 2022-01-22 MED ORDER — ZOLEDRONIC ACID 4 MG/100ML IV SOLN
4.0000 mg | Freq: Once | INTRAVENOUS | Status: AC
  Administered 2022-01-22: 4 mg via INTRAVENOUS
  Filled 2022-01-22: qty 100

## 2022-01-22 MED ORDER — HEPARIN SOD (PORK) LOCK FLUSH 100 UNIT/ML IV SOLN: 250.0000 [IU] | Freq: Once | INTRAVENOUS | Status: DC | PRN

## 2022-01-22 MED ORDER — ALTEPLASE 2 MG IJ SOLR: 2.0000 mg | Freq: Once | INTRAMUSCULAR | Status: DC | PRN

## 2022-01-22 MED ORDER — SODIUM CHLORIDE 0.9% FLUSH: 3.0000 mL | Freq: Once | INTRAVENOUS | Status: DC | PRN

## 2022-01-22 MED ORDER — SODIUM CHLORIDE 0.9% FLUSH: 10.0000 mL | Freq: Once | INTRAVENOUS | Status: DC | PRN

## 2022-01-22 MED ORDER — HEPARIN SOD (PORK) LOCK FLUSH 100 UNIT/ML IV SOLN: 500.0000 [IU] | Freq: Once | INTRAVENOUS | Status: DC | PRN

## 2022-01-22 MED ORDER — SODIUM CHLORIDE 0.9 % IV SOLN: Freq: Once | INTRAVENOUS | Status: AC

## 2022-01-22 NOTE — Patient Instructions (Signed)

## 2022-01-23 ENCOUNTER — Other Ambulatory Visit (HOSPITAL_COMMUNITY): Payer: Self-pay

## 2022-01-23 ENCOUNTER — Encounter: Payer: Self-pay | Admitting: Nurse Practitioner

## 2022-01-25 ENCOUNTER — Other Ambulatory Visit (HOSPITAL_COMMUNITY): Payer: Self-pay

## 2022-01-25 LAB — KAPPA/LAMBDA LIGHT CHAINS
Kappa free light chain: 28.4 mg/L — ABNORMAL HIGH (ref 3.3–19.4)
Kappa, lambda light chain ratio: 2.51 — ABNORMAL HIGH (ref 0.26–1.65)
Lambda free light chains: 11.3 mg/L (ref 5.7–26.3)

## 2022-01-26 ENCOUNTER — Other Ambulatory Visit (HOSPITAL_COMMUNITY): Payer: Self-pay

## 2022-01-26 LAB — MULTIPLE MYELOMA PANEL, SERUM
Albumin SerPl Elph-Mcnc: 3.6 g/dL (ref 2.9–4.4)
Albumin/Glob SerPl: 1.2 (ref 0.7–1.7)
Alpha 1: 0.2 g/dL (ref 0.0–0.4)
Alpha2 Glob SerPl Elph-Mcnc: 0.8 g/dL (ref 0.4–1.0)
B-Globulin SerPl Elph-Mcnc: 1.2 g/dL (ref 0.7–1.3)
Gamma Glob SerPl Elph-Mcnc: 0.9 g/dL (ref 0.4–1.8)
Globulin, Total: 3.2 g/dL (ref 2.2–3.9)
IgA: 100 mg/dL (ref 90–386)
IgG (Immunoglobin G), Serum: 863 mg/dL (ref 603–1613)
IgM (Immunoglobulin M), Srm: 49 mg/dL (ref 20–172)
Total Protein ELP: 6.8 g/dL (ref 6.0–8.5)

## 2022-01-29 ENCOUNTER — Telehealth: Payer: Self-pay

## 2022-01-29 ENCOUNTER — Encounter: Payer: Self-pay | Admitting: Nurse Practitioner

## 2022-01-29 ENCOUNTER — Other Ambulatory Visit (HOSPITAL_COMMUNITY): Payer: Self-pay

## 2022-01-29 NOTE — Telephone Encounter (Signed)
Oral Oncology Patient Advocate Encounter  Was successful in securing patient a $12,000 grant from [facility] to provide copayment coverage for Revlimid.  This will keep the out of pocket expense at $0.     [employer] ID: [id number]  I have spoken with the patient.   The billing information is as follows and has been shared with [facility].    RxBin: [id number] PCN: [id number] Member ID: [id number] Group ID: [id number] Dates of Eligibility: 10.11.23 through 10.10.24  Fund:  Multiple Myeloma - Medicare [place], [facility] Patient [place]  [phone] (phone) [phone] (fax) 01/29/2022 2:56 PM

## 2022-02-01 ENCOUNTER — Encounter: Payer: Self-pay | Admitting: Nurse Practitioner

## 2022-02-01 ENCOUNTER — Other Ambulatory Visit: Payer: Self-pay | Admitting: *Deleted

## 2022-02-01 DIAGNOSIS — C9000 Multiple myeloma not having achieved remission: Secondary | ICD-10-CM

## 2022-02-01 MED ORDER — LENALIDOMIDE 5 MG PO CAPS: 5.0000 mg | ORAL_CAPSULE | Freq: Every day | ORAL | 0 refills | Status: DC

## 2022-02-16 ENCOUNTER — Encounter: Payer: Self-pay | Admitting: *Deleted

## 2022-02-16 NOTE — Progress Notes (Signed)
Received medical record request from [place]. Emailed to HIM department to process.

## 2022-02-19 ENCOUNTER — Other Ambulatory Visit: Payer: Self-pay | Admitting: Nurse Practitioner

## 2022-02-19 ENCOUNTER — Other Ambulatory Visit: Payer: Medicare Other

## 2022-02-19 ENCOUNTER — Ambulatory Visit: Payer: Medicare Other | Admitting: Oncology

## 2022-02-19 DIAGNOSIS — C9000 Multiple myeloma not having achieved remission: Secondary | ICD-10-CM

## 2022-02-24 ENCOUNTER — Telehealth: Payer: Self-pay

## 2022-02-24 NOTE — Telephone Encounter (Signed)
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  Call at [phone]. I know Dr. Benay Spice know and place the note on his desk.

## 2022-02-25 ENCOUNTER — Other Ambulatory Visit: Payer: Self-pay | Admitting: *Deleted

## 2022-02-25 ENCOUNTER — Inpatient Hospital Stay: Payer: Medicare Other | Admitting: Oncology

## 2022-02-25 ENCOUNTER — Inpatient Hospital Stay: Payer: Medicare Other | Attending: Oncology

## 2022-02-25 VITALS — BP 138/94 | HR 62 | Temp 98.8°F | Resp 16 | Wt 228.4 lb

## 2022-02-25 DIAGNOSIS — C9000 Multiple myeloma not having achieved remission: Secondary | ICD-10-CM | POA: Diagnosis not present

## 2022-02-25 DIAGNOSIS — R531 Weakness: Secondary | ICD-10-CM | POA: Diagnosis not present

## 2022-02-25 DIAGNOSIS — D649 Anemia, unspecified: Secondary | ICD-10-CM | POA: Diagnosis not present

## 2022-02-25 DIAGNOSIS — I10 Essential (primary) hypertension: Secondary | ICD-10-CM | POA: Diagnosis not present

## 2022-02-25 DIAGNOSIS — M8580 Other specified disorders of bone density and structure, unspecified site: Secondary | ICD-10-CM | POA: Insufficient documentation

## 2022-02-25 DIAGNOSIS — R21 Rash and other nonspecific skin eruption: Secondary | ICD-10-CM | POA: Diagnosis not present

## 2022-02-25 DIAGNOSIS — Z86718 Personal history of other venous thrombosis and embolism: Secondary | ICD-10-CM | POA: Diagnosis not present

## 2022-02-25 LAB — CBC WITH DIFFERENTIAL (CANCER CENTER ONLY)
Abs Immature Granulocytes: 0.01 10*3/uL (ref 0.00–0.07)
Basophils Absolute: 0.1 10*3/uL (ref 0.0–0.1)
Basophils Relative: 1 %
Eosinophils Absolute: 0.2 10*3/uL (ref 0.0–0.5)
Eosinophils Relative: 5 %
HCT: 41.1 % (ref 39.0–52.0)
Hemoglobin: 14 g/dL (ref 13.0–17.0)
Immature Granulocytes: 0 %
Lymphocytes Relative: 42 %
Lymphs Abs: 1.5 10*3/uL (ref 0.7–4.0)
MCH: 25.9 pg — ABNORMAL LOW (ref 26.0–34.0)
MCHC: 34.1 g/dL (ref 30.0–36.0)
MCV: 76.1 fL — ABNORMAL LOW (ref 80.0–100.0)
Monocytes Absolute: 0.4 10*3/uL (ref 0.1–1.0)
Monocytes Relative: 11 %
Neutro Abs: 1.4 10*3/uL — ABNORMAL LOW (ref 1.7–7.7)
Neutrophils Relative %: 41 %
Platelet Count: 185 10*3/uL (ref 150–400)
RBC: 5.4 MIL/uL (ref 4.22–5.81)
RDW: 14.3 % (ref 11.5–15.5)
WBC Count: 3.5 10*3/uL — ABNORMAL LOW (ref 4.0–10.5)
nRBC: 0 % (ref 0.0–0.2)

## 2022-02-25 LAB — CMP (CANCER CENTER ONLY)
ALT: 38 U/L (ref 0–44)
AST: 23 U/L (ref 15–41)
Albumin: 4.4 g/dL (ref 3.5–5.0)
Alkaline Phosphatase: 55 U/L (ref 38–126)
Anion gap: 10 (ref 5–15)
BUN: 11 mg/dL (ref 6–20)
CO2: 23 mmol/L (ref 22–32)
Calcium: 9.3 mg/dL (ref 8.9–10.3)
Chloride: 107 mmol/L (ref 98–111)
Creatinine: 1.19 mg/dL (ref 0.61–1.24)
GFR, Estimated: >60 mL/min (ref >60)
Glucose, Bld: 109 mg/dL — ABNORMAL HIGH (ref 70–99)
Potassium: 3.4 mmol/L — ABNORMAL LOW (ref 3.5–5.1)
Sodium: 140 mmol/L (ref 135–145)
Total Bilirubin: 0.4 mg/dL (ref 0.3–1.2)
Total Protein: 6.9 g/dL (ref 6.5–8.1)

## 2022-02-25 LAB — MAGNESIUM: Magnesium: 1.6 mg/dL — ABNORMAL LOW (ref 1.7–2.4)

## 2022-02-25 MED ORDER — VITAMIN D (ERGOCALCIFEROL) 1.25 MG (50000 UNIT) PO CAPS: 50000.0000 [IU] | ORAL_CAPSULE | ORAL | 1 refills | Status: DC

## 2022-02-25 MED ORDER — LENALIDOMIDE 5 MG PO CAPS: 5.0000 mg | ORAL_CAPSULE | Freq: Every day | ORAL | 0 refills | Status: DC
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
## 2022-03-01 LAB — PROTEIN ELECTROPHORESIS, SERUM
A/G Ratio: 1.3 (ref 0.7–1.7)
Albumin ELP: 3.6 g/dL (ref 2.9–4.4)
Alpha-1-Globulin: 0.2 g/dL (ref 0.0–0.4)
Alpha-2-Globulin: 0.8 g/dL (ref 0.4–1.0)
Beta Globulin: 1.1 g/dL (ref 0.7–1.3)
Gamma Globulin: 0.8 g/dL (ref 0.4–1.8)
Globulin, Total: 2.8 g/dL (ref 2.2–3.9)
Total Protein ELP: 6.4 g/dL (ref 6.0–8.5)

## 2022-03-01 LAB — KAPPA/LAMBDA LIGHT CHAINS
Kappa free light chain: 28.5 mg/L — ABNORMAL HIGH (ref 3.3–19.4)
Kappa, lambda light chain ratio: 2.36 — ABNORMAL HIGH (ref 0.26–1.65)
Lambda free light chains: 12.1 mg/L (ref 5.7–26.3)

## 2022-03-05 ENCOUNTER — Other Ambulatory Visit: Payer: Self-pay | Admitting: *Deleted

## 2022-03-05 MED ORDER — AMLODIPINE BESYLATE 5 MG PO TABS: 5.0000 mg | ORAL_TABLET | Freq: Every day | ORAL | 0 refills | Status: DC

## 2022-03-05 NOTE — Telephone Encounter (Signed)
Requesting refill on amlodipine 5 mg. Not on current med list. Had been being filled per [facility] per patient. OK for 1 refill with all future refills per primary care.

## 2022-03-23 ENCOUNTER — Encounter: Payer: Self-pay | Admitting: *Deleted

## 2022-03-24 ENCOUNTER — Other Ambulatory Visit: Payer: Self-pay | Admitting: *Deleted

## 2022-03-24 ENCOUNTER — Other Ambulatory Visit (HOSPITAL_COMMUNITY): Payer: Self-pay

## 2022-03-24 ENCOUNTER — Telehealth: Payer: Self-pay | Admitting: Pharmacist

## 2022-03-24 DIAGNOSIS — C9000 Multiple myeloma not having achieved remission: Secondary | ICD-10-CM

## 2022-03-24 MED ORDER — LENALIDOMIDE 5 MG PO CAPS: 5.0000 mg | ORAL_CAPSULE | Freq: Every day | ORAL | 0 refills | Status: DC

## 2022-03-24 NOTE — Telephone Encounter (Signed)
Derek [name] returned my call. He knows his prescription will now be filled at [facility]. Provided him with the phone number to [facility].

## 2022-03-24 NOTE — Telephone Encounter (Signed)
Notified by oral oncology team that beginning 2024, his lenalidomide will come from [facility] . Refill sent.

## 2022-03-24 NOTE — Telephone Encounter (Signed)
Oral Chemotherapy Pharmacist Encounter   Patient will be transitioning from mfg assistance for Revlimid to filling at [facility] using grant funding. Merceda Elks, RN informed of the change, she will be sending a refill prescription in for this patient this week.  Supportive information and grant information sent to [facility].    Darl Pikes, PharmD, BCPS, BCOP, CPP Hematology/Oncology Clinical Pharmacist [place]/DB/AP [facility] [phone]  03/24/2022 2:55 PM

## 2022-03-25 ENCOUNTER — Inpatient Hospital Stay: Payer: Medicare Other | Attending: Oncology

## 2022-03-25 ENCOUNTER — Inpatient Hospital Stay: Payer: Medicare Other | Admitting: Oncology

## 2022-03-25 ENCOUNTER — Inpatient Hospital Stay: Payer: Medicare Other

## 2022-03-25 ENCOUNTER — Other Ambulatory Visit (HOSPITAL_COMMUNITY): Payer: Self-pay

## 2022-03-25 VITALS — BP 122/91 | HR 84 | Temp 98.1°F | Resp 18 | Ht 73.0 in | Wt 226.4 lb

## 2022-03-25 DIAGNOSIS — C9000 Multiple myeloma not having achieved remission: Secondary | ICD-10-CM | POA: Diagnosis present

## 2022-03-25 DIAGNOSIS — R531 Weakness: Secondary | ICD-10-CM | POA: Diagnosis not present

## 2022-03-25 DIAGNOSIS — R0789 Other chest pain: Secondary | ICD-10-CM | POA: Diagnosis not present

## 2022-03-25 DIAGNOSIS — M549 Dorsalgia, unspecified: Secondary | ICD-10-CM | POA: Diagnosis not present

## 2022-03-25 DIAGNOSIS — I10 Essential (primary) hypertension: Secondary | ICD-10-CM | POA: Diagnosis not present

## 2022-03-25 DIAGNOSIS — M8580 Other specified disorders of bone density and structure, unspecified site: Secondary | ICD-10-CM | POA: Insufficient documentation

## 2022-03-25 LAB — CBC WITH DIFFERENTIAL (CANCER CENTER ONLY)
Abs Immature Granulocytes: 0.03 10*3/uL (ref 0.00–0.07)
Basophils Absolute: 0.1 10*3/uL (ref 0.0–0.1)
Basophils Relative: 3 %
Eosinophils Absolute: 0.1 10*3/uL (ref 0.0–0.5)
Eosinophils Relative: 4 %
HCT: 39.7 % (ref 39.0–52.0)
Hemoglobin: 13.4 g/dL (ref 13.0–17.0)
Immature Granulocytes: 1 %
Lymphocytes Relative: 37 %
Lymphs Abs: 1.3 10*3/uL (ref 0.7–4.0)
MCH: 25.7 pg — ABNORMAL LOW (ref 26.0–34.0)
MCHC: 33.8 g/dL (ref 30.0–36.0)
MCV: 76.2 fL — ABNORMAL LOW (ref 80.0–100.0)
Monocytes Absolute: 0.4 10*3/uL (ref 0.1–1.0)
Monocytes Relative: 12 %
Neutro Abs: 1.5 10*3/uL — ABNORMAL LOW (ref 1.7–7.7)
Neutrophils Relative %: 43 %
Platelet Count: 202 10*3/uL (ref 150–400)
RBC: 5.21 MIL/uL (ref 4.22–5.81)
RDW: 14.1 % (ref 11.5–15.5)
WBC Count: 3.5 10*3/uL — ABNORMAL LOW (ref 4.0–10.5)
nRBC: 0 % (ref 0.0–0.2)

## 2022-03-25 LAB — CMP (CANCER CENTER ONLY)
ALT: 20 U/L (ref 0–44)
AST: 22 U/L (ref 15–41)
Albumin: 4.5 g/dL (ref 3.5–5.0)
Alkaline Phosphatase: 52 U/L (ref 38–126)
Anion gap: 11 (ref 5–15)
BUN: 10 mg/dL (ref 6–20)
CO2: 23 mmol/L (ref 22–32)
Calcium: 9 mg/dL (ref 8.9–10.3)
Chloride: 106 mmol/L (ref 98–111)
Creatinine: 1.2 mg/dL (ref 0.61–1.24)
GFR, Estimated: >60 mL/min (ref >60)
Glucose, Bld: 102 mg/dL — ABNORMAL HIGH (ref 70–99)
Potassium: 3.8 mmol/L (ref 3.5–5.1)
Sodium: 140 mmol/L (ref 135–145)
Total Bilirubin: 0.7 mg/dL (ref 0.3–1.2)
Total Protein: 7.1 g/dL (ref 6.5–8.1)

## 2022-03-25 MED ORDER — HYDROMORPHONE HCL 2 MG PO TABS: 2.0000 mg | ORAL_TABLET | Freq: Four times a day (QID) | ORAL | 0 refills | Status: DC | PRN

## 2022-03-25 NOTE — Progress Notes (Signed)
[place] Work  Clinical Social Work was referred by nurse for assessment of psychosocial needs.  Clinical Social Worker contacted patient by phone to offer support and assess for needs.    Patient stated he is experiencing financial difficulties due to the discontinuation of his long term disability through his employer.  Explored options and his current cancer treatment.  CSW to consult Gwinda Maine, LCSW, and will follow up with patient regarding eligibility for various grants.  He stated he understood.     Margaree Mackintosh, LCSW  Clinical Social Worker [facility]
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
## 2022-03-26 ENCOUNTER — Inpatient Hospital Stay: Payer: Medicare Other

## 2022-03-26 LAB — KAPPA/LAMBDA LIGHT CHAINS
Kappa free light chain: 30.3 mg/L — ABNORMAL HIGH (ref 3.3–19.4)
Kappa, lambda light chain ratio: 2.39 — ABNORMAL HIGH (ref 0.26–1.65)
Lambda free light chains: 12.7 mg/L (ref 5.7–26.3)

## 2022-03-26 NOTE — Progress Notes (Signed)
Derek [name]  Clinical Education officer, museum contacted patient by phone to determine available resources.  CSW confirmed with Derek Fair, LCSW, that patient is eligible for the [facility] based on his cancer treatment.  Informed patient he would have to address financial eligibility with the financial counselor, Derek [name].  CSW made the referral to Derek [name].  Also discussed Derek [name].  He is interested in both.  They will be left at the front desk of the [facility] on Tuesday, 1/9, for patient to sign.  He stated he felt some stress relief after the conversation.    Derek Pickle Khaliah Barnick, LCSW

## 2022-03-28 ENCOUNTER — Other Ambulatory Visit: Payer: Self-pay | Admitting: Oncology

## 2022-03-28 DIAGNOSIS — C9000 Multiple myeloma not having achieved remission: Secondary | ICD-10-CM

## 2022-03-29 LAB — PROTEIN ELECTROPHORESIS, SERUM
A/G Ratio: 1.3 (ref 0.7–1.7)
Albumin ELP: 3.7 g/dL (ref 2.9–4.4)
Alpha-1-Globulin: 0.2 g/dL (ref 0.0–0.4)
Alpha-2-Globulin: 0.7 g/dL (ref 0.4–1.0)
Beta Globulin: 1.1 g/dL (ref 0.7–1.3)
Gamma Globulin: 0.8 g/dL (ref 0.4–1.8)
Globulin, Total: 2.8 g/dL (ref 2.2–3.9)
Total Protein ELP: 6.5 g/dL (ref 6.0–8.5)

## 2022-03-30 ENCOUNTER — Inpatient Hospital Stay: Payer: Medicare Other | Admitting: Licensed Clinical Social Worker

## 2022-03-30 NOTE — Progress Notes (Signed)
[facility] CSW Progress Note  Holiday representative met with patient to discuss financial situation.  Provided patient with four [employer] cards.  He also applied for the [employer] Patient Aid.  Provided patient with contact information for CSW and Otilio Carpen, Development worker, community.  Provided active listening and supportive counseling.     Margaree Mackintosh, LCSW  Clinical Social Worker [facility]

## 2022-04-01 ENCOUNTER — Encounter: Payer: Self-pay | Admitting: *Deleted

## 2022-04-01 NOTE — Progress Notes (Signed)
Called [facility] to f/u on status of lenalidomide script sent on 03/24/22 after Derek [name] had contacted office that they did not have the script. Confirmed with [facility] that they do have the script and are waiting for decision on his grant application. Derek [name] notified of delay.

## 2022-04-02 ENCOUNTER — Telehealth: Payer: Self-pay | Admitting: Oncology

## 2022-04-02 NOTE — Telephone Encounter (Signed)
Pt was referred by Creola Corn. Duffy, MSW, LCSW Clinical Education officer, museum for possible assistance of the [employer].  I spoke w him today and his is going to provide his income via email.

## 2022-04-06 ENCOUNTER — Encounter: Payer: Self-pay | Admitting: *Deleted

## 2022-04-06 NOTE — Progress Notes (Signed)
Mr. Poche reports he just received his lenalidomide and will start tonight. Needs to move his appointments out 1 week. Scheduling message sent.

## 2022-04-07 NOTE — Telephone Encounter (Signed)
Revlimid was delivered by [facility] on 04/03/22.

## 2022-04-08 ENCOUNTER — Telehealth: Payer: Self-pay | Admitting: Oncology

## 2022-04-08 NOTE — Telephone Encounter (Signed)
Pt has been approved for a one-time grant $1000 from special funds through the [employer]: 04/08/2022  This money is to help you with medications or other critical bills (not related to [facility] bills) during treatment and until funds exhaust.

## 2022-04-15 ENCOUNTER — Other Ambulatory Visit: Payer: Self-pay | Admitting: Oncology

## 2022-04-22 ENCOUNTER — Other Ambulatory Visit: Payer: Medicare Other

## 2022-04-22 ENCOUNTER — Ambulatory Visit: Payer: Medicare Other

## 2022-04-22 ENCOUNTER — Ambulatory Visit: Payer: Medicare Other | Admitting: Oncology

## 2022-04-24 ENCOUNTER — Other Ambulatory Visit: Payer: Self-pay | Admitting: Nurse Practitioner

## 2022-04-24 DIAGNOSIS — C9000 Multiple myeloma not having achieved remission: Secondary | ICD-10-CM

## 2022-04-26 ENCOUNTER — Encounter: Payer: Self-pay | Admitting: *Deleted

## 2022-04-27 ENCOUNTER — Other Ambulatory Visit: Payer: Self-pay | Admitting: *Deleted

## 2022-04-27 DIAGNOSIS — C9000 Multiple myeloma not having achieved remission: Secondary | ICD-10-CM

## 2022-04-27 MED ORDER — LENALIDOMIDE 5 MG PO CAPS: 5.0000 mg | ORAL_CAPSULE | Freq: Every day | ORAL | 0 refills | Status: DC

## 2022-04-29 ENCOUNTER — Inpatient Hospital Stay: Payer: Medicare Other

## 2022-04-29 ENCOUNTER — Inpatient Hospital Stay: Payer: Medicare Other | Attending: Oncology

## 2022-04-29 ENCOUNTER — Inpatient Hospital Stay: Payer: Medicare Other | Admitting: Oncology

## 2022-04-29 ENCOUNTER — Encounter: Payer: Self-pay | Admitting: *Deleted

## 2022-04-29 VITALS — BP 122/87 | HR 62 | Resp 20

## 2022-04-29 VITALS — BP 130/92 | HR 77 | Temp 97.9°F | Resp 18 | Ht 73.0 in | Wt 223.0 lb

## 2022-04-29 DIAGNOSIS — C9000 Multiple myeloma not having achieved remission: Secondary | ICD-10-CM

## 2022-04-29 LAB — CBC WITH DIFFERENTIAL (CANCER CENTER ONLY)
Abs Immature Granulocytes: 0.01 10*3/uL (ref 0.00–0.07)
Basophils Absolute: 0.1 10*3/uL (ref 0.0–0.1)
Basophils Relative: 2 %
Eosinophils Absolute: 0.1 10*3/uL (ref 0.0–0.5)
Eosinophils Relative: 4 %
HCT: 40.6 % (ref 39.0–52.0)
Hemoglobin: 13.7 g/dL (ref 13.0–17.0)
Immature Granulocytes: 0 %
Lymphocytes Relative: 40 %
Lymphs Abs: 1.5 10*3/uL (ref 0.7–4.0)
MCH: 25.8 pg — ABNORMAL LOW (ref 26.0–34.0)
MCHC: 33.7 g/dL (ref 30.0–36.0)
MCV: 76.3 fL — ABNORMAL LOW (ref 80.0–100.0)
Monocytes Absolute: 0.4 10*3/uL (ref 0.1–1.0)
Monocytes Relative: 12 %
Neutro Abs: 1.6 10*3/uL — ABNORMAL LOW (ref 1.7–7.7)
Neutrophils Relative %: 42 %
Platelet Count: 196 10*3/uL (ref 150–400)
RBC: 5.32 MIL/uL (ref 4.22–5.81)
RDW: 13.8 % (ref 11.5–15.5)
WBC Count: 3.7 10*3/uL — ABNORMAL LOW (ref 4.0–10.5)
nRBC: 0 % (ref 0.0–0.2)

## 2022-04-29 LAB — CMP (CANCER CENTER ONLY)
ALT: 24 U/L (ref 0–44)
AST: 18 U/L (ref 15–41)
Albumin: 4.5 g/dL (ref 3.5–5.0)
Alkaline Phosphatase: 56 U/L (ref 38–126)
Anion gap: 9 (ref 5–15)
BUN: 11 mg/dL (ref 6–20)
CO2: 24 mmol/L (ref 22–32)
Calcium: 9.2 mg/dL (ref 8.9–10.3)
Chloride: 106 mmol/L (ref 98–111)
Creatinine: 1.23 mg/dL (ref 0.61–1.24)
GFR, Estimated: >60 mL/min (ref >60)
Glucose, Bld: 113 mg/dL — ABNORMAL HIGH (ref 70–99)
Potassium: 3.5 mmol/L (ref 3.5–5.1)
Sodium: 139 mmol/L (ref 135–145)
Total Bilirubin: 0.7 mg/dL (ref 0.3–1.2)
Total Protein: 7.1 g/dL (ref 6.5–8.1)

## 2022-04-29 LAB — MAGNESIUM: Magnesium: 1.8 mg/dL (ref 1.7–2.4)

## 2022-04-29 MED ORDER — SODIUM CHLORIDE 0.9 % IV SOLN: Freq: Once | INTRAVENOUS | Status: AC

## 2022-04-29 MED ORDER — ZOLEDRONIC ACID 4 MG/100ML IV SOLN
4.0000 mg | Freq: Once | INTRAVENOUS | Status: AC
  Administered 2022-04-29: 4 mg via INTRAVENOUS
  Filled 2022-04-29: qty 100

## 2022-04-29 NOTE — Patient Instructions (Signed)
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
## 2022-04-29 NOTE — Progress Notes (Signed)
Patient seen by Dr. Benay Spice today  Vitals are within treatment parameters. No intervention for BP  Labs reviewed by Dr. Benay Spice and are within treatment parameters.   Per physician team, patient is ready for treatment and there are NO modifications to the treatment plan. Ready for Zometa today

## 2022-04-30 LAB — KAPPA/LAMBDA LIGHT CHAINS
Kappa free light chain: 27 mg/L — ABNORMAL HIGH (ref 3.3–19.4)
Kappa, lambda light chain ratio: 2.41 — ABNORMAL HIGH (ref 0.26–1.65)
Lambda free light chains: 11.2 mg/L (ref 5.7–26.3)

## 2022-05-03 LAB — PROTEIN ELECTROPHORESIS, SERUM
A/G Ratio: 1.3 (ref 0.7–1.7)
Albumin ELP: 3.7 g/dL (ref 2.9–4.4)
Alpha-1-Globulin: 0.2 g/dL (ref 0.0–0.4)
Alpha-2-Globulin: 0.7 g/dL (ref 0.4–1.0)
Beta Globulin: 1.1 g/dL (ref 0.7–1.3)
Gamma Globulin: 0.8 g/dL (ref 0.4–1.8)
Globulin, Total: 2.8 g/dL (ref 2.2–3.9)
Total Protein ELP: 6.5 g/dL (ref 6.0–8.5)

## 2022-05-25 ENCOUNTER — Other Ambulatory Visit: Payer: Self-pay | Admitting: *Deleted

## 2022-05-25 DIAGNOSIS — C9000 Multiple myeloma not having achieved remission: Secondary | ICD-10-CM

## 2022-05-25 MED ORDER — LENALIDOMIDE 5 MG PO CAPS: 5.0000 mg | ORAL_CAPSULE | Freq: Every day | ORAL | 0 refills | Status: DC

## 2022-05-25 NOTE — Telephone Encounter (Signed)
Faxed refill request from [facility] for Lenalidomide 5 mg

## 2022-05-28 ENCOUNTER — Inpatient Hospital Stay: Payer: Medicare Other | Attending: Oncology

## 2022-05-28 ENCOUNTER — Encounter: Payer: Self-pay | Admitting: Nurse Practitioner

## 2022-05-28 ENCOUNTER — Inpatient Hospital Stay: Payer: Medicare Other | Admitting: Nurse Practitioner

## 2022-05-28 VITALS — BP 128/90 | HR 82 | Temp 98.2°F | Resp 18 | Ht 73.0 in | Wt 224.6 lb

## 2022-05-28 DIAGNOSIS — M549 Dorsalgia, unspecified: Secondary | ICD-10-CM | POA: Diagnosis not present

## 2022-05-28 DIAGNOSIS — Z7901 Long term (current) use of anticoagulants: Secondary | ICD-10-CM | POA: Diagnosis not present

## 2022-05-28 DIAGNOSIS — I10 Essential (primary) hypertension: Secondary | ICD-10-CM | POA: Insufficient documentation

## 2022-05-28 DIAGNOSIS — D649 Anemia, unspecified: Secondary | ICD-10-CM | POA: Insufficient documentation

## 2022-05-28 DIAGNOSIS — C9000 Multiple myeloma not having achieved remission: Secondary | ICD-10-CM | POA: Insufficient documentation

## 2022-05-28 DIAGNOSIS — Z86718 Personal history of other venous thrombosis and embolism: Secondary | ICD-10-CM | POA: Insufficient documentation

## 2022-05-28 DIAGNOSIS — R0789 Other chest pain: Secondary | ICD-10-CM | POA: Insufficient documentation

## 2022-05-28 DIAGNOSIS — M8580 Other specified disorders of bone density and structure, unspecified site: Secondary | ICD-10-CM | POA: Insufficient documentation

## 2022-05-28 LAB — CBC WITH DIFFERENTIAL (CANCER CENTER ONLY)
Abs Immature Granulocytes: 0.01 10*3/uL (ref 0.00–0.07)
Basophils Absolute: 0.1 10*3/uL (ref 0.0–0.1)
Basophils Relative: 2 %
Eosinophils Absolute: 0.2 10*3/uL (ref 0.0–0.5)
Eosinophils Relative: 4 %
HCT: 42.3 % (ref 39.0–52.0)
Hemoglobin: 14 g/dL (ref 13.0–17.0)
Immature Granulocytes: 0 %
Lymphocytes Relative: 41 %
Lymphs Abs: 1.5 10*3/uL (ref 0.7–4.0)
MCH: 25.6 pg — ABNORMAL LOW (ref 26.0–34.0)
MCHC: 33.1 g/dL (ref 30.0–36.0)
MCV: 77.3 fL — ABNORMAL LOW (ref 80.0–100.0)
Monocytes Absolute: 0.5 10*3/uL (ref 0.1–1.0)
Monocytes Relative: 13 %
Neutro Abs: 1.5 10*3/uL — ABNORMAL LOW (ref 1.7–7.7)
Neutrophils Relative %: 40 %
Platelet Count: 202 10*3/uL (ref 150–400)
RBC: 5.47 MIL/uL (ref 4.22–5.81)
RDW: 13.9 % (ref 11.5–15.5)
WBC Count: 3.8 10*3/uL — ABNORMAL LOW (ref 4.0–10.5)
nRBC: 0 % (ref 0.0–0.2)

## 2022-05-28 LAB — CMP (CANCER CENTER ONLY)
ALT: 22 U/L (ref 0–44)
AST: 20 U/L (ref 15–41)
Albumin: 4.3 g/dL (ref 3.5–5.0)
Alkaline Phosphatase: 52 U/L (ref 38–126)
Anion gap: 10 (ref 5–15)
BUN: 15 mg/dL (ref 6–20)
CO2: 22 mmol/L (ref 22–32)
Calcium: 9.4 mg/dL (ref 8.9–10.3)
Chloride: 108 mmol/L (ref 98–111)
Creatinine: 1.33 mg/dL — ABNORMAL HIGH (ref 0.61–1.24)
GFR, Estimated: >60 mL/min (ref >60)
Glucose, Bld: 106 mg/dL — ABNORMAL HIGH (ref 70–99)
Potassium: 4 mmol/L (ref 3.5–5.1)
Sodium: 140 mmol/L (ref 135–145)
Total Bilirubin: 0.6 mg/dL (ref 0.3–1.2)
Total Protein: 7.4 g/dL (ref 6.5–8.1)

## 2022-05-28 LAB — MAGNESIUM: Magnesium: 1.8 mg/dL (ref 1.7–2.4)

## 2022-05-28 MED ORDER — VITAMIN D (ERGOCALCIFEROL) 1.25 MG (50000 UNIT) PO CAPS: 50000.0000 [IU] | ORAL_CAPSULE | ORAL | 3 refills | Status: DC
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
## 2022-05-31 LAB — KAPPA/LAMBDA LIGHT CHAINS
Kappa free light chain: 27.1 mg/L — ABNORMAL HIGH (ref 3.3–19.4)
Kappa, lambda light chain ratio: 2.12 — ABNORMAL HIGH (ref 0.26–1.65)
Lambda free light chains: 12.8 mg/L (ref 5.7–26.3)

## 2022-06-01 LAB — PROTEIN ELECTROPHORESIS, SERUM
A/G Ratio: 1.2 (ref 0.7–1.7)
Albumin ELP: 3.7 g/dL (ref 2.9–4.4)
Alpha-1-Globulin: 0.2 g/dL (ref 0.0–0.4)
Alpha-2-Globulin: 0.8 g/dL (ref 0.4–1.0)
Beta Globulin: 1.2 g/dL (ref 0.7–1.3)
Gamma Globulin: 0.9 g/dL (ref 0.4–1.8)
Globulin, Total: 3.1 g/dL (ref 2.2–3.9)
M-Spike, %: 0.1 g/dL — ABNORMAL HIGH
Total Protein ELP: 6.8 g/dL (ref 6.0–8.5)

## 2022-06-22 ENCOUNTER — Encounter: Payer: Self-pay | Admitting: *Deleted

## 2022-06-23 ENCOUNTER — Other Ambulatory Visit: Payer: Self-pay | Admitting: *Deleted

## 2022-06-23 DIAGNOSIS — C9000 Multiple myeloma not having achieved remission: Secondary | ICD-10-CM

## 2022-06-23 MED ORDER — LENALIDOMIDE 5 MG PO CAPS
5.0000 mg | ORAL_CAPSULE | Freq: Every day | ORAL | 0 refills | Status: DC
Start: 2022-06-23 — End: 2022-07-20

## 2022-06-23 NOTE — Telephone Encounter (Signed)
Confirmed w/Derek [name] that he is due next cycle of Lenalidomide on 06/30/22. OK to start that day prior to MD visit.

## 2022-06-26 ENCOUNTER — Other Ambulatory Visit: Payer: Self-pay | Admitting: Oncology

## 2022-06-26 DIAGNOSIS — C9000 Multiple myeloma not having achieved remission: Secondary | ICD-10-CM

## 2022-06-28 ENCOUNTER — Other Ambulatory Visit: Payer: Self-pay | Admitting: *Deleted

## 2022-06-28 ENCOUNTER — Encounter: Payer: Self-pay | Admitting: Nurse Practitioner

## 2022-06-28 DIAGNOSIS — C9000 Multiple myeloma not having achieved remission: Secondary | ICD-10-CM

## 2022-06-28 MED ORDER — CYCLOBENZAPRINE HCL 5 MG PO TABS
ORAL_TABLET | ORAL | 0 refills | Status: DC
Start: 2022-06-28 — End: 2022-09-22

## 2022-07-02 ENCOUNTER — Inpatient Hospital Stay: Payer: Medicare Other | Admitting: Oncology

## 2022-07-02 ENCOUNTER — Inpatient Hospital Stay: Payer: Medicare Other | Attending: Oncology

## 2022-07-02 VITALS — BP 118/90 | HR 86 | Temp 98.1°F | Resp 18 | Ht 73.0 in | Wt 215.0 lb

## 2022-07-02 DIAGNOSIS — Z86718 Personal history of other venous thrombosis and embolism: Secondary | ICD-10-CM | POA: Diagnosis not present

## 2022-07-02 DIAGNOSIS — C9000 Multiple myeloma not having achieved remission: Secondary | ICD-10-CM

## 2022-07-02 DIAGNOSIS — R531 Weakness: Secondary | ICD-10-CM | POA: Diagnosis not present

## 2022-07-02 DIAGNOSIS — D649 Anemia, unspecified: Secondary | ICD-10-CM | POA: Insufficient documentation

## 2022-07-02 DIAGNOSIS — M549 Dorsalgia, unspecified: Secondary | ICD-10-CM | POA: Insufficient documentation

## 2022-07-02 DIAGNOSIS — R0789 Other chest pain: Secondary | ICD-10-CM | POA: Insufficient documentation

## 2022-07-02 LAB — CBC WITH DIFFERENTIAL (CANCER CENTER ONLY)
Abs Immature Granulocytes: 0.02 10*3/uL (ref 0.00–0.07)
Basophils Absolute: 0.1 10*3/uL (ref 0.0–0.1)
Basophils Relative: 3 %
Eosinophils Absolute: 0.2 10*3/uL (ref 0.0–0.5)
Eosinophils Relative: 4 %
HCT: 43.8 % (ref 39.0–52.0)
Hemoglobin: 14.9 g/dL (ref 13.0–17.0)
Immature Granulocytes: 1 %
Lymphocytes Relative: 47 %
Lymphs Abs: 2 10*3/uL (ref 0.7–4.0)
MCH: 26 pg (ref 26.0–34.0)
MCHC: 34 g/dL (ref 30.0–36.0)
MCV: 76.4 fL — ABNORMAL LOW (ref 80.0–100.0)
Monocytes Absolute: 0.6 10*3/uL (ref 0.1–1.0)
Monocytes Relative: 14 %
Neutro Abs: 1.3 10*3/uL — ABNORMAL LOW (ref 1.7–7.7)
Neutrophils Relative %: 31 %
Platelet Count: 196 10*3/uL (ref 150–400)
RBC: 5.73 MIL/uL (ref 4.22–5.81)
RDW: 13.8 % (ref 11.5–15.5)
WBC Count: 4.1 10*3/uL (ref 4.0–10.5)
nRBC: 0 % (ref 0.0–0.2)

## 2022-07-02 LAB — CMP (CANCER CENTER ONLY)
ALT: 33 U/L (ref 0–44)
AST: 18 U/L (ref 15–41)
Albumin: 4.6 g/dL (ref 3.5–5.0)
Alkaline Phosphatase: 69 U/L (ref 38–126)
Anion gap: 10 (ref 5–15)
BUN: 12 mg/dL (ref 6–20)
CO2: 23 mmol/L (ref 22–32)
Calcium: 9.3 mg/dL (ref 8.9–10.3)
Chloride: 104 mmol/L (ref 98–111)
Creatinine: 1.37 mg/dL — ABNORMAL HIGH (ref 0.61–1.24)
GFR, Estimated: >60 mL/min (ref >60)
Glucose, Bld: 112 mg/dL — ABNORMAL HIGH (ref 70–99)
Potassium: 3.8 mmol/L (ref 3.5–5.1)
Sodium: 137 mmol/L (ref 135–145)
Total Bilirubin: 0.6 mg/dL (ref 0.3–1.2)
Total Protein: 7 g/dL (ref 6.5–8.1)

## 2022-07-02 LAB — MAGNESIUM: Magnesium: 1.7 mg/dL (ref 1.7–2.4)
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
## 2022-07-05 LAB — KAPPA/LAMBDA LIGHT CHAINS
Kappa free light chain: 34.8 mg/L — ABNORMAL HIGH (ref 3.3–19.4)
Kappa, lambda light chain ratio: 4.52 — ABNORMAL HIGH (ref 0.26–1.65)
Lambda free light chains: 7.7 mg/L (ref 5.7–26.3)

## 2022-07-12 ENCOUNTER — Other Ambulatory Visit: Payer: Self-pay | Admitting: Oncology

## 2022-07-12 ENCOUNTER — Other Ambulatory Visit: Payer: Self-pay | Admitting: *Deleted

## 2022-07-12 DIAGNOSIS — C9000 Multiple myeloma not having achieved remission: Secondary | ICD-10-CM

## 2022-07-12 MED ORDER — DIPHENOXYLATE-ATROPINE 2.5-0.025 MG PO TABS
ORAL_TABLET | ORAL | 0 refills | Status: DC
Start: 2022-07-12 — End: 2022-09-15

## 2022-07-20 ENCOUNTER — Other Ambulatory Visit: Payer: Self-pay | Admitting: *Deleted

## 2022-07-20 DIAGNOSIS — C9000 Multiple myeloma not having achieved remission: Secondary | ICD-10-CM

## 2022-07-20 MED ORDER — LENALIDOMIDE 5 MG PO CAPS
5.0000 mg | ORAL_CAPSULE | Freq: Every day | ORAL | 0 refills | Status: DC
Start: 2022-07-20 — End: 2022-09-15

## 2022-07-26 ENCOUNTER — Inpatient Hospital Stay: Payer: Medicare Other

## 2022-07-26 ENCOUNTER — Inpatient Hospital Stay: Payer: Medicare Other | Attending: Oncology

## 2022-07-26 ENCOUNTER — Inpatient Hospital Stay: Payer: Medicare Other | Admitting: Nurse Practitioner

## 2022-07-26 ENCOUNTER — Encounter: Payer: Self-pay | Admitting: Nurse Practitioner

## 2022-07-26 VITALS — BP 127/89 | HR 60 | Resp 18

## 2022-07-26 VITALS — BP 121/85 | HR 82 | Temp 98.1°F | Resp 18 | Ht 73.0 in | Wt 215.0 lb

## 2022-07-26 DIAGNOSIS — G629 Polyneuropathy, unspecified: Secondary | ICD-10-CM | POA: Insufficient documentation

## 2022-07-26 DIAGNOSIS — D649 Anemia, unspecified: Secondary | ICD-10-CM | POA: Diagnosis not present

## 2022-07-26 DIAGNOSIS — M549 Dorsalgia, unspecified: Secondary | ICD-10-CM | POA: Insufficient documentation

## 2022-07-26 DIAGNOSIS — I10 Essential (primary) hypertension: Secondary | ICD-10-CM | POA: Diagnosis not present

## 2022-07-26 DIAGNOSIS — K0889 Other specified disorders of teeth and supporting structures: Secondary | ICD-10-CM | POA: Diagnosis not present

## 2022-07-26 DIAGNOSIS — R0789 Other chest pain: Secondary | ICD-10-CM | POA: Insufficient documentation

## 2022-07-26 DIAGNOSIS — Z86718 Personal history of other venous thrombosis and embolism: Secondary | ICD-10-CM | POA: Diagnosis not present

## 2022-07-26 DIAGNOSIS — R531 Weakness: Secondary | ICD-10-CM | POA: Insufficient documentation

## 2022-07-26 DIAGNOSIS — C9000 Multiple myeloma not having achieved remission: Secondary | ICD-10-CM

## 2022-07-26 DIAGNOSIS — Z7901 Long term (current) use of anticoagulants: Secondary | ICD-10-CM | POA: Insufficient documentation

## 2022-07-26 LAB — CBC WITH DIFFERENTIAL (CANCER CENTER ONLY)
Abs Immature Granulocytes: 0.02 10*3/uL (ref 0.00–0.07)
Basophils Absolute: 0.1 10*3/uL (ref 0.0–0.1)
Basophils Relative: 2 %
Eosinophils Absolute: 0.1 10*3/uL (ref 0.0–0.5)
Eosinophils Relative: 3 %
HCT: 43 % (ref 39.0–52.0)
Hemoglobin: 14.2 g/dL (ref 13.0–17.0)
Immature Granulocytes: 1 %
Lymphocytes Relative: 39 %
Lymphs Abs: 1.7 10*3/uL (ref 0.7–4.0)
MCH: 25.7 pg — ABNORMAL LOW (ref 26.0–34.0)
MCHC: 33 g/dL (ref 30.0–36.0)
MCV: 77.8 fL — ABNORMAL LOW (ref 80.0–100.0)
Monocytes Absolute: 0.5 10*3/uL (ref 0.1–1.0)
Monocytes Relative: 11 %
Neutro Abs: 1.8 10*3/uL (ref 1.7–7.7)
Neutrophils Relative %: 44 %
Platelet Count: 202 10*3/uL (ref 150–400)
RBC: 5.53 MIL/uL (ref 4.22–5.81)
RDW: 14.3 % (ref 11.5–15.5)
WBC Count: 4.2 10*3/uL (ref 4.0–10.5)
nRBC: 0 % (ref 0.0–0.2)

## 2022-07-26 LAB — CMP (CANCER CENTER ONLY)
ALT: 27 U/L (ref 0–44)
AST: 18 U/L (ref 15–41)
Albumin: 4.3 g/dL (ref 3.5–5.0)
Alkaline Phosphatase: 62 U/L (ref 38–126)
Anion gap: 9 (ref 5–15)
BUN: 10 mg/dL (ref 6–20)
CO2: 24 mmol/L (ref 22–32)
Calcium: 9 mg/dL (ref 8.9–10.3)
Chloride: 107 mmol/L (ref 98–111)
Creatinine: 1.31 mg/dL — ABNORMAL HIGH (ref 0.61–1.24)
GFR, Estimated: >60 mL/min (ref >60)
Glucose, Bld: 106 mg/dL — ABNORMAL HIGH (ref 70–99)
Potassium: 3.9 mmol/L (ref 3.5–5.1)
Sodium: 140 mmol/L (ref 135–145)
Total Bilirubin: 0.7 mg/dL (ref 0.3–1.2)
Total Protein: 7.4 g/dL (ref 6.5–8.1)

## 2022-07-26 MED ORDER — ZOLEDRONIC ACID 4 MG/100ML IV SOLN
4.0000 mg | Freq: Once | INTRAVENOUS | Status: AC
  Administered 2022-07-26: 4 mg via INTRAVENOUS
  Filled 2022-07-26: qty 100

## 2022-07-26 MED ORDER — SODIUM CHLORIDE 0.9 % IV SOLN: Freq: Once | INTRAVENOUS | Status: AC

## 2022-07-26 NOTE — Progress Notes (Signed)
Patient seen by Lisa Thomas NP today  Vitals are within treatment parameters.  Labs reviewed by Lisa Thomas NP and are within treatment parameters.  Per physician team, patient is ready for treatment and there are NO modifications to the treatment plan.     

## 2022-07-26 NOTE — Patient Instructions (Signed)
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
## 2022-07-27 LAB — KAPPA/LAMBDA LIGHT CHAINS
Kappa free light chain: 29.9 mg/L — ABNORMAL HIGH (ref 3.3–19.4)
Kappa, lambda light chain ratio: 2.34 — ABNORMAL HIGH (ref 0.26–1.65)
Lambda free light chains: 12.8 mg/L (ref 5.7–26.3)

## 2022-08-18 ENCOUNTER — Encounter: Payer: Self-pay | Admitting: Nurse Practitioner

## 2022-08-18 ENCOUNTER — Inpatient Hospital Stay: Payer: Medicare Other

## 2022-08-18 ENCOUNTER — Other Ambulatory Visit: Payer: Self-pay | Admitting: Oncology

## 2022-08-18 ENCOUNTER — Inpatient Hospital Stay: Payer: Medicare Other | Admitting: Nurse Practitioner

## 2022-08-18 VITALS — BP 125/86 | HR 78 | Temp 98.1°F | Resp 18 | Ht 73.0 in | Wt 216.0 lb

## 2022-08-18 DIAGNOSIS — C9000 Multiple myeloma not having achieved remission: Secondary | ICD-10-CM

## 2022-08-18 LAB — CBC WITH DIFFERENTIAL (CANCER CENTER ONLY)
Abs Immature Granulocytes: 0.01 10*3/uL (ref 0.00–0.07)
Basophils Absolute: 0.1 10*3/uL (ref 0.0–0.1)
Basophils Relative: 2 %
Eosinophils Absolute: 0.2 10*3/uL (ref 0.0–0.5)
Eosinophils Relative: 3 %
HCT: 40.5 % (ref 39.0–52.0)
Hemoglobin: 13.5 g/dL (ref 13.0–17.0)
Immature Granulocytes: 0 %
Lymphocytes Relative: 35 %
Lymphs Abs: 1.8 10*3/uL (ref 0.7–4.0)
MCH: 25.9 pg — ABNORMAL LOW (ref 26.0–34.0)
MCHC: 33.3 g/dL (ref 30.0–36.0)
MCV: 77.7 fL — ABNORMAL LOW (ref 80.0–100.0)
Monocytes Absolute: 0.4 10*3/uL (ref 0.1–1.0)
Monocytes Relative: 9 %
Neutro Abs: 2.6 10*3/uL (ref 1.7–7.7)
Neutrophils Relative %: 51 %
Platelet Count: 222 10*3/uL (ref 150–400)
RBC: 5.21 MIL/uL (ref 4.22–5.81)
RDW: 14.6 % (ref 11.5–15.5)
WBC Count: 5.1 10*3/uL (ref 4.0–10.5)
nRBC: 0 % (ref 0.0–0.2)

## 2022-08-18 LAB — CMP (CANCER CENTER ONLY)
ALT: 23 U/L (ref 0–44)
AST: 16 U/L (ref 15–41)
Albumin: 4.3 g/dL (ref 3.5–5.0)
Alkaline Phosphatase: 62 U/L (ref 38–126)
Anion gap: 9 (ref 5–15)
BUN: 8 mg/dL (ref 6–20)
CO2: 22 mmol/L (ref 22–32)
Calcium: 9 mg/dL (ref 8.9–10.3)
Chloride: 109 mmol/L (ref 98–111)
Creatinine: 1.15 mg/dL (ref 0.61–1.24)
GFR, Estimated: >60 mL/min (ref >60)
Glucose, Bld: 125 mg/dL — ABNORMAL HIGH (ref 70–99)
Potassium: 4.1 mmol/L (ref 3.5–5.1)
Sodium: 140 mmol/L (ref 135–145)
Total Bilirubin: 0.3 mg/dL (ref 0.3–1.2)
Total Protein: 6.9 g/dL (ref 6.5–8.1)
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
## 2022-08-19 LAB — KAPPA/LAMBDA LIGHT CHAINS
Kappa free light chain: 20.3 mg/L — ABNORMAL HIGH (ref 3.3–19.4)
Kappa, lambda light chain ratio: 1.81 — ABNORMAL HIGH (ref 0.26–1.65)
Lambda free light chains: 11.2 mg/L (ref 5.7–26.3)

## 2022-08-19 NOTE — Telephone Encounter (Signed)
Not needed at this time. Has enough on hand

## 2022-09-15 ENCOUNTER — Inpatient Hospital Stay: Payer: Medicare Other | Admitting: Oncology

## 2022-09-15 ENCOUNTER — Inpatient Hospital Stay: Payer: Medicare Other | Attending: Oncology

## 2022-09-15 ENCOUNTER — Telehealth: Payer: Self-pay | Admitting: Oncology

## 2022-09-15 ENCOUNTER — Other Ambulatory Visit: Payer: Self-pay | Admitting: Oncology

## 2022-09-15 VITALS — BP 124/87 | HR 82 | Temp 98.1°F | Resp 18 | Ht 73.0 in | Wt 217.9 lb

## 2022-09-15 DIAGNOSIS — C9000 Multiple myeloma not having achieved remission: Secondary | ICD-10-CM

## 2022-09-15 DIAGNOSIS — K0889 Other specified disorders of teeth and supporting structures: Secondary | ICD-10-CM | POA: Insufficient documentation

## 2022-09-15 DIAGNOSIS — D630 Anemia in neoplastic disease: Secondary | ICD-10-CM | POA: Insufficient documentation

## 2022-09-15 DIAGNOSIS — M549 Dorsalgia, unspecified: Secondary | ICD-10-CM | POA: Diagnosis not present

## 2022-09-15 DIAGNOSIS — N19 Unspecified kidney failure: Secondary | ICD-10-CM | POA: Insufficient documentation

## 2022-09-15 DIAGNOSIS — Z86718 Personal history of other venous thrombosis and embolism: Secondary | ICD-10-CM | POA: Insufficient documentation

## 2022-09-15 DIAGNOSIS — I10 Essential (primary) hypertension: Secondary | ICD-10-CM | POA: Diagnosis not present

## 2022-09-15 DIAGNOSIS — R0789 Other chest pain: Secondary | ICD-10-CM | POA: Diagnosis not present

## 2022-09-15 DIAGNOSIS — G629 Polyneuropathy, unspecified: Secondary | ICD-10-CM | POA: Diagnosis not present

## 2022-09-15 LAB — CMP (CANCER CENTER ONLY)
ALT: 13 U/L (ref 0–44)
AST: 15 U/L (ref 15–41)
Albumin: 4.3 g/dL (ref 3.5–5.0)
Alkaline Phosphatase: 52 U/L (ref 38–126)
Anion gap: 8 (ref 5–15)
BUN: 10 mg/dL (ref 6–20)
CO2: 22 mmol/L (ref 22–32)
Calcium: 9 mg/dL (ref 8.9–10.3)
Chloride: 110 mmol/L (ref 98–111)
Creatinine: 1.26 mg/dL — ABNORMAL HIGH (ref 0.61–1.24)
GFR, Estimated: >60 mL/min (ref >60)
Glucose, Bld: 126 mg/dL — ABNORMAL HIGH (ref 70–99)
Potassium: 3.8 mmol/L (ref 3.5–5.1)
Sodium: 140 mmol/L (ref 135–145)
Total Bilirubin: 0.5 mg/dL (ref 0.3–1.2)
Total Protein: 6.9 g/dL (ref 6.5–8.1)

## 2022-09-15 LAB — CBC WITH DIFFERENTIAL (CANCER CENTER ONLY)
Abs Immature Granulocytes: 0.01 10*3/uL (ref 0.00–0.07)
Basophils Absolute: 0.1 10*3/uL (ref 0.0–0.1)
Basophils Relative: 3 %
Eosinophils Absolute: 0.1 10*3/uL (ref 0.0–0.5)
Eosinophils Relative: 2 %
HCT: 40.5 % (ref 39.0–52.0)
Hemoglobin: 13.6 g/dL (ref 13.0–17.0)
Immature Granulocytes: 0 %
Lymphocytes Relative: 44 %
Lymphs Abs: 1.8 10*3/uL (ref 0.7–4.0)
MCH: 26.2 pg (ref 26.0–34.0)
MCHC: 33.6 g/dL (ref 30.0–36.0)
MCV: 78 fL — ABNORMAL LOW (ref 80.0–100.0)
Monocytes Absolute: 0.4 10*3/uL (ref 0.1–1.0)
Monocytes Relative: 10 %
Neutro Abs: 1.7 10*3/uL (ref 1.7–7.7)
Neutrophils Relative %: 41 %
Platelet Count: 279 10*3/uL (ref 150–400)
RBC: 5.19 MIL/uL (ref 4.22–5.81)
RDW: 14.7 % (ref 11.5–15.5)
WBC Count: 4.3 10*3/uL (ref 4.0–10.5)
nRBC: 0 % (ref 0.0–0.2)

## 2022-09-15 MED ORDER — LENALIDOMIDE 5 MG PO CAPS
5.0000 mg | ORAL_CAPSULE | Freq: Every day | ORAL | 0 refills | Status: DC
Start: 2022-09-17 — End: 2022-10-07

## 2022-09-15 MED ORDER — DIPHENOXYLATE-ATROPINE 2.5-0.025 MG PO TABS
ORAL_TABLET | ORAL | 0 refills | Status: DC
Start: 2022-09-15 — End: 2022-10-14

## 2022-09-15 MED ORDER — HYDROMORPHONE HCL 2 MG PO TABS: 2.0000 mg | ORAL_TABLET | Freq: Four times a day (QID) | ORAL | 0 refills | Status: AC | PRN
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
## 2022-09-15 NOTE — Telephone Encounter (Signed)
Pt here today for office visit. Pt will start revlimid on 09/17/22 Authorization # obtained today [id number]. Refill sent to [facility]. Pt requested lomotil refill as well and was sent to [facility].

## 2022-09-16 ENCOUNTER — Telehealth: Payer: Self-pay | Admitting: Oncology

## 2022-09-16 NOTE — Telephone Encounter (Signed)
Scheduled per 06/26 los, patient has been called and notified.  

## 2022-09-22 ENCOUNTER — Other Ambulatory Visit: Payer: Self-pay | Admitting: Oncology

## 2022-09-22 ENCOUNTER — Encounter: Payer: Self-pay | Admitting: Nurse Practitioner

## 2022-09-22 DIAGNOSIS — C9000 Multiple myeloma not having achieved remission: Secondary | ICD-10-CM

## 2022-09-24 ENCOUNTER — Telehealth: Payer: Self-pay

## 2022-09-24 NOTE — Telephone Encounter (Signed)
Spoke with pt regarding question regarding his Revlimid. Pt verbalizes understanding and is appreciative of call back.

## 2022-09-25 ENCOUNTER — Other Ambulatory Visit: Payer: Self-pay | Admitting: Nurse Practitioner

## 2022-09-25 DIAGNOSIS — C9000 Multiple myeloma not having achieved remission: Secondary | ICD-10-CM

## 2022-09-27 ENCOUNTER — Encounter: Payer: Self-pay | Admitting: Nurse Practitioner

## 2022-10-07 ENCOUNTER — Other Ambulatory Visit: Payer: Self-pay | Admitting: *Deleted

## 2022-10-07 DIAGNOSIS — C9000 Multiple myeloma not having achieved remission: Secondary | ICD-10-CM

## 2022-10-07 MED ORDER — LENALIDOMIDE 5 MG PO CAPS
5.0000 mg | ORAL_CAPSULE | Freq: Every day | ORAL | 0 refills | Status: DC
Start: 2022-10-07 — End: 2022-11-03

## 2022-10-07 NOTE — Telephone Encounter (Signed)
Faxed refill request from [facility] for next cycle of Revlimid due on 10/15/22. Refill approved.

## 2022-10-14 ENCOUNTER — Encounter: Payer: Self-pay | Admitting: Oncology

## 2022-10-14 ENCOUNTER — Other Ambulatory Visit: Payer: Self-pay | Admitting: *Deleted

## 2022-10-14 ENCOUNTER — Inpatient Hospital Stay: Payer: Medicare Other | Admitting: Oncology

## 2022-10-14 ENCOUNTER — Inpatient Hospital Stay: Payer: Medicare Other | Attending: Oncology

## 2022-10-14 ENCOUNTER — Encounter: Payer: Self-pay | Admitting: *Deleted

## 2022-10-14 VITALS — BP 125/88 | HR 71 | Temp 98.1°F | Resp 18 | Ht 73.0 in | Wt 215.0 lb

## 2022-10-14 DIAGNOSIS — D630 Anemia in neoplastic disease: Secondary | ICD-10-CM | POA: Diagnosis not present

## 2022-10-14 DIAGNOSIS — C9000 Multiple myeloma not having achieved remission: Secondary | ICD-10-CM

## 2022-10-14 DIAGNOSIS — Z86718 Personal history of other venous thrombosis and embolism: Secondary | ICD-10-CM | POA: Insufficient documentation

## 2022-10-14 DIAGNOSIS — N19 Unspecified kidney failure: Secondary | ICD-10-CM | POA: Diagnosis not present

## 2022-10-14 LAB — CBC WITH DIFFERENTIAL (CANCER CENTER ONLY)
Abs Immature Granulocytes: 0.01 10*3/uL (ref 0.00–0.07)
Basophils Absolute: 0.1 10*3/uL (ref 0.0–0.1)
Basophils Relative: 3 %
Eosinophils Absolute: 0.1 10*3/uL (ref 0.0–0.5)
Eosinophils Relative: 3 %
HCT: 40 % (ref 39.0–52.0)
Hemoglobin: 13.6 g/dL (ref 13.0–17.0)
Immature Granulocytes: 0 %
Lymphocytes Relative: 43 %
Lymphs Abs: 1.6 10*3/uL (ref 0.7–4.0)
MCH: 26.7 pg (ref 26.0–34.0)
MCHC: 34 g/dL (ref 30.0–36.0)
MCV: 78.4 fL — ABNORMAL LOW (ref 80.0–100.0)
Monocytes Absolute: 0.4 10*3/uL (ref 0.1–1.0)
Monocytes Relative: 10 %
Neutro Abs: 1.5 10*3/uL — ABNORMAL LOW (ref 1.7–7.7)
Neutrophils Relative %: 41 %
Platelet Count: 255 10*3/uL (ref 150–400)
RBC: 5.1 MIL/uL (ref 4.22–5.81)
RDW: 13.8 % (ref 11.5–15.5)
WBC Count: 3.7 10*3/uL — ABNORMAL LOW (ref 4.0–10.5)
nRBC: 0 % (ref 0.0–0.2)

## 2022-10-14 LAB — CMP (CANCER CENTER ONLY)
ALT: 30 U/L (ref 0–44)
AST: 26 U/L (ref 15–41)
Albumin: 4.4 g/dL (ref 3.5–5.0)
Alkaline Phosphatase: 48 U/L (ref 38–126)
Anion gap: 8 (ref 5–15)
BUN: 14 mg/dL (ref 6–20)
CO2: 24 mmol/L (ref 22–32)
Calcium: 9.4 mg/dL (ref 8.9–10.3)
Chloride: 106 mmol/L (ref 98–111)
Creatinine: 1.38 mg/dL — ABNORMAL HIGH (ref 0.61–1.24)
GFR, Estimated: >60 mL/min (ref >60)
Glucose, Bld: 111 mg/dL — ABNORMAL HIGH (ref 70–99)
Potassium: 3.8 mmol/L (ref 3.5–5.1)
Sodium: 138 mmol/L (ref 135–145)
Total Bilirubin: 0.7 mg/dL (ref 0.3–1.2)
Total Protein: 7.3 g/dL (ref 6.5–8.1)

## 2022-10-14 MED ORDER — DIPHENOXYLATE-ATROPINE 2.5-0.025 MG PO TABS
ORAL_TABLET | ORAL | 4 refills | Status: DC
Start: 2022-10-14 — End: 2023-06-07
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
## 2022-10-14 NOTE — Progress Notes (Signed)
Return fax from J.Hendren, DMD w/[facility] that he will not require extractions,so OK to resume zometa.

## 2022-10-15 ENCOUNTER — Other Ambulatory Visit: Payer: Self-pay | Admitting: Oncology

## 2022-11-03 ENCOUNTER — Other Ambulatory Visit: Payer: Self-pay | Admitting: *Deleted

## 2022-11-03 DIAGNOSIS — C9000 Multiple myeloma not having achieved remission: Secondary | ICD-10-CM

## 2022-11-03 MED ORDER — LENALIDOMIDE 5 MG PO CAPS: 5.0000 mg | ORAL_CAPSULE | Freq: Every day | ORAL | 0 refills | Status: DC

## 2022-11-03 NOTE — Telephone Encounter (Signed)
Refill for Revlimid sent to [facility].

## 2022-11-09 ENCOUNTER — Encounter: Payer: Self-pay | Admitting: Nurse Practitioner

## 2022-11-09 ENCOUNTER — Inpatient Hospital Stay: Payer: Medicare Other | Admitting: Nurse Practitioner

## 2022-11-09 ENCOUNTER — Inpatient Hospital Stay: Payer: Medicare Other

## 2022-11-09 ENCOUNTER — Inpatient Hospital Stay: Payer: Medicare Other | Attending: Oncology

## 2022-11-09 VITALS — BP 122/83 | HR 71 | Temp 98.2°F | Resp 18 | Ht 73.0 in | Wt 220.0 lb

## 2022-11-09 VITALS — BP 122/88 | HR 61 | Resp 18

## 2022-11-09 DIAGNOSIS — C9000 Multiple myeloma not having achieved remission: Secondary | ICD-10-CM

## 2022-11-09 LAB — CBC WITH DIFFERENTIAL (CANCER CENTER ONLY)
Abs Immature Granulocytes: 0 10*3/uL (ref 0.00–0.07)
Basophils Absolute: 0.1 10*3/uL (ref 0.0–0.1)
Basophils Relative: 3 %
Eosinophils Absolute: 0.1 10*3/uL (ref 0.0–0.5)
Eosinophils Relative: 2 %
HCT: 40.4 % (ref 39.0–52.0)
Hemoglobin: 13.4 g/dL (ref 13.0–17.0)
Immature Granulocytes: 0 %
Lymphocytes Relative: 44 %
Lymphs Abs: 1.8 10*3/uL (ref 0.7–4.0)
MCH: 26.4 pg (ref 26.0–34.0)
MCHC: 33.2 g/dL (ref 30.0–36.0)
MCV: 79.5 fL — ABNORMAL LOW (ref 80.0–100.0)
Monocytes Absolute: 0.4 10*3/uL (ref 0.1–1.0)
Monocytes Relative: 9 %
Neutro Abs: 1.8 10*3/uL (ref 1.7–7.7)
Neutrophils Relative %: 42 %
Platelet Count: 223 10*3/uL (ref 150–400)
RBC: 5.08 MIL/uL (ref 4.22–5.81)
RDW: 13.5 % (ref 11.5–15.5)
WBC Count: 4.1 10*3/uL (ref 4.0–10.5)
nRBC: 0 % (ref 0.0–0.2)

## 2022-11-09 LAB — CMP (CANCER CENTER ONLY)
ALT: 18 U/L (ref 0–44)
AST: 17 U/L (ref 15–41)
Albumin: 4.5 g/dL (ref 3.5–5.0)
Alkaline Phosphatase: 55 U/L (ref 38–126)
Anion gap: 9 (ref 5–15)
BUN: 12 mg/dL (ref 6–20)
CO2: 23 mmol/L (ref 22–32)
Calcium: 9.5 mg/dL (ref 8.9–10.3)
Chloride: 108 mmol/L (ref 98–111)
Creatinine: 1.26 mg/dL — ABNORMAL HIGH (ref 0.61–1.24)
GFR, Estimated: >60 mL/min (ref >60)
Glucose, Bld: 111 mg/dL — ABNORMAL HIGH (ref 70–99)
Potassium: 3.7 mmol/L (ref 3.5–5.1)
Sodium: 140 mmol/L (ref 135–145)
Total Bilirubin: 0.6 mg/dL (ref 0.3–1.2)
Total Protein: 6.9 g/dL (ref 6.5–8.1)

## 2022-11-09 MED ORDER — ZOLEDRONIC ACID 4 MG/100ML IV SOLN
4.0000 mg | Freq: Once | INTRAVENOUS | Status: AC
  Administered 2022-11-09: 4 mg via INTRAVENOUS
  Filled 2022-11-09: qty 100

## 2022-11-09 MED ORDER — SODIUM CHLORIDE 0.9 % IV SOLN: Freq: Once | INTRAVENOUS | Status: AC

## 2022-11-09 NOTE — Progress Notes (Signed)
Patient seen by Lisa Thomas NP today  Vitals are within treatment parameters.  Labs reviewed by Lisa Thomas NP and are within treatment parameters.  Per physician team, patient is ready for treatment and there are NO modifications to the treatment plan.     

## 2022-11-09 NOTE — Patient Instructions (Signed)
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
## 2022-11-10 LAB — KAPPA/LAMBDA LIGHT CHAINS
Kappa free light chain: 27.5 mg/L — ABNORMAL HIGH (ref 3.3–19.4)
Kappa, lambda light chain ratio: 1.88 — ABNORMAL HIGH (ref 0.26–1.65)
Lambda free light chains: 14.6 mg/L (ref 5.7–26.3)

## 2022-11-14 LAB — MULTIPLE MYELOMA PANEL, SERUM
Albumin SerPl Elph-Mcnc: 3.6 g/dL (ref 2.9–4.4)
Albumin/Glob SerPl: 1.3 (ref 0.7–1.7)
Alpha 1: 0.2 g/dL (ref 0.0–0.4)
Alpha2 Glob SerPl Elph-Mcnc: 0.7 g/dL (ref 0.4–1.0)
B-Globulin SerPl Elph-Mcnc: 1.1 g/dL (ref 0.7–1.3)
Gamma Glob SerPl Elph-Mcnc: 0.8 g/dL (ref 0.4–1.8)
Globulin, Total: 2.8 g/dL (ref 2.2–3.9)
IgA: 95 mg/dL (ref 90–386)
IgG (Immunoglobin G), Serum: 950 mg/dL (ref 603–1613)
IgM (Immunoglobulin M), Srm: 47 mg/dL (ref 20–172)
Total Protein ELP: 6.4 g/dL (ref 6.0–8.5)

## 2022-11-19 ENCOUNTER — Other Ambulatory Visit: Payer: Self-pay | Admitting: Oncology

## 2022-11-19 ENCOUNTER — Encounter: Payer: Self-pay | Admitting: Oncology

## 2022-11-19 DIAGNOSIS — C9000 Multiple myeloma not having achieved remission: Secondary | ICD-10-CM

## 2022-12-01 ENCOUNTER — Other Ambulatory Visit: Payer: Self-pay | Admitting: *Deleted

## 2022-12-01 DIAGNOSIS — C9000 Multiple myeloma not having achieved remission: Secondary | ICD-10-CM

## 2022-12-01 MED ORDER — LENALIDOMIDE 5 MG PO CAPS: 5.0000 mg | ORAL_CAPSULE | Freq: Every day | ORAL | 0 refills | Status: DC

## 2022-12-01 NOTE — Telephone Encounter (Signed)
Received faxed refill request for Lenalidomide from [facility].

## 2022-12-06 ENCOUNTER — Inpatient Hospital Stay: Payer: Medicare Other | Attending: Oncology

## 2022-12-06 ENCOUNTER — Inpatient Hospital Stay: Payer: Medicare Other | Admitting: Oncology

## 2022-12-06 DIAGNOSIS — C9000 Multiple myeloma not having achieved remission: Secondary | ICD-10-CM | POA: Diagnosis present

## 2022-12-06 LAB — CMP (CANCER CENTER ONLY)
ALT: 15 U/L (ref 0–44)
AST: 16 U/L (ref 15–41)
Albumin: 4.1 g/dL (ref 3.5–5.0)
Alkaline Phosphatase: 53 U/L (ref 38–126)
Anion gap: 10 (ref 5–15)
BUN: 15 mg/dL (ref 6–20)
CO2: 21 mmol/L — ABNORMAL LOW (ref 22–32)
Calcium: 8.6 mg/dL — ABNORMAL LOW (ref 8.9–10.3)
Chloride: 108 mmol/L (ref 98–111)
Creatinine: 1.26 mg/dL — ABNORMAL HIGH (ref 0.61–1.24)
GFR, Estimated: >60 mL/min (ref >60)
Glucose, Bld: 135 mg/dL — ABNORMAL HIGH (ref 70–99)
Potassium: 3.7 mmol/L (ref 3.5–5.1)
Sodium: 139 mmol/L (ref 135–145)
Total Bilirubin: 0.4 mg/dL (ref 0.3–1.2)
Total Protein: 6.7 g/dL (ref 6.5–8.1)

## 2022-12-06 LAB — CBC WITH DIFFERENTIAL (CANCER CENTER ONLY)
Abs Immature Granulocytes: 0.01 10*3/uL (ref 0.00–0.07)
Basophils Absolute: 0.1 10*3/uL (ref 0.0–0.1)
Basophils Relative: 3 %
Eosinophils Absolute: 0.1 10*3/uL (ref 0.0–0.5)
Eosinophils Relative: 2 %
HCT: 40.5 % (ref 39.0–52.0)
Hemoglobin: 13.4 g/dL (ref 13.0–17.0)
Immature Granulocytes: 0 %
Lymphocytes Relative: 45 %
Lymphs Abs: 1.5 10*3/uL (ref 0.7–4.0)
MCH: 26 pg (ref 26.0–34.0)
MCHC: 33.1 g/dL (ref 30.0–36.0)
MCV: 78.6 fL — ABNORMAL LOW (ref 80.0–100.0)
Monocytes Absolute: 0.3 10*3/uL (ref 0.1–1.0)
Monocytes Relative: 9 %
Neutro Abs: 1.4 10*3/uL — ABNORMAL LOW (ref 1.7–7.7)
Neutrophils Relative %: 41 %
Platelet Count: 204 10*3/uL (ref 150–400)
RBC: 5.15 MIL/uL (ref 4.22–5.81)
RDW: 13.2 % (ref 11.5–15.5)
WBC Count: 3.4 10*3/uL — ABNORMAL LOW (ref 4.0–10.5)
nRBC: 0 % (ref 0.0–0.2)

## 2022-12-07 ENCOUNTER — Telehealth: Payer: Self-pay | Admitting: *Deleted

## 2022-12-07 DIAGNOSIS — C9000 Multiple myeloma not having achieved remission: Secondary | ICD-10-CM

## 2022-12-07 LAB — KAPPA/LAMBDA LIGHT CHAINS
Kappa free light chain: 27.6 mg/L — ABNORMAL HIGH (ref 3.3–19.4)
Kappa, lambda light chain ratio: 2.04 — ABNORMAL HIGH (ref 0.26–1.65)
Lambda free light chains: 13.5 mg/L (ref 5.7–26.3)

## 2022-12-07 NOTE — Telephone Encounter (Signed)
9/16 labs reviewed by Dr. Truett Perna: OK to proceed with Revlimid cycle due on 9/20. Myeloma panel still pending. Informed Derek [name] and that he will be rescheduled for October before his next Revlimid cycle is due. Expressed concern that his mom is in hospital with CHF and not doing well.

## 2022-12-09 ENCOUNTER — Other Ambulatory Visit: Payer: Medicare Other

## 2022-12-09 ENCOUNTER — Ambulatory Visit: Payer: Medicare Other | Admitting: Oncology

## 2022-12-10 LAB — MULTIPLE MYELOMA PANEL, SERUM
Albumin SerPl Elph-Mcnc: 3.8 g/dL (ref 2.9–4.4)
Albumin/Glob SerPl: 1.5 (ref 0.7–1.7)
Alpha 1: 0.2 g/dL (ref 0.0–0.4)
Alpha2 Glob SerPl Elph-Mcnc: 0.7 g/dL (ref 0.4–1.0)
B-Globulin SerPl Elph-Mcnc: 1 g/dL (ref 0.7–1.3)
Gamma Glob SerPl Elph-Mcnc: 0.7 g/dL (ref 0.4–1.8)
Globulin, Total: 2.6 g/dL (ref 2.2–3.9)
IgA: 105 mg/dL (ref 90–386)
IgG (Immunoglobin G), Serum: 905 mg/dL (ref 603–1613)
IgM (Immunoglobulin M), Srm: 46 mg/dL (ref 20–172)
M Protein SerPl Elph-Mcnc: 0.3 g/dL — ABNORMAL HIGH
Total Protein ELP: 6.4 g/dL (ref 6.0–8.5)

## 2022-12-29 ENCOUNTER — Other Ambulatory Visit: Payer: Self-pay | Admitting: *Deleted

## 2022-12-29 DIAGNOSIS — C9000 Multiple myeloma not having achieved remission: Secondary | ICD-10-CM

## 2022-12-29 MED ORDER — LENALIDOMIDE 5 MG PO CAPS: 5.0000 mg | ORAL_CAPSULE | Freq: Every day | ORAL | 0 refills | Status: DC

## 2022-12-29 NOTE — Telephone Encounter (Signed)
Faxed refill request received from [facility] for lenalidomide

## 2023-01-04 ENCOUNTER — Encounter: Payer: Self-pay | Admitting: Nurse Practitioner

## 2023-01-04 ENCOUNTER — Inpatient Hospital Stay: Payer: Medicare Other | Attending: Oncology

## 2023-01-04 ENCOUNTER — Inpatient Hospital Stay: Payer: Medicare Other | Admitting: Nurse Practitioner

## 2023-01-04 VITALS — BP 124/89 | HR 67 | Temp 98.1°F | Resp 18 | Ht 73.0 in | Wt 212.7 lb

## 2023-01-04 DIAGNOSIS — Z7901 Long term (current) use of anticoagulants: Secondary | ICD-10-CM | POA: Diagnosis not present

## 2023-01-04 DIAGNOSIS — Z86718 Personal history of other venous thrombosis and embolism: Secondary | ICD-10-CM | POA: Diagnosis not present

## 2023-01-04 DIAGNOSIS — D709 Neutropenia, unspecified: Secondary | ICD-10-CM | POA: Insufficient documentation

## 2023-01-04 DIAGNOSIS — I10 Essential (primary) hypertension: Secondary | ICD-10-CM | POA: Diagnosis not present

## 2023-01-04 DIAGNOSIS — K0889 Other specified disorders of teeth and supporting structures: Secondary | ICD-10-CM | POA: Insufficient documentation

## 2023-01-04 DIAGNOSIS — C9000 Multiple myeloma not having achieved remission: Secondary | ICD-10-CM | POA: Insufficient documentation

## 2023-01-04 DIAGNOSIS — Z79899 Other long term (current) drug therapy: Secondary | ICD-10-CM | POA: Insufficient documentation

## 2023-01-04 DIAGNOSIS — D630 Anemia in neoplastic disease: Secondary | ICD-10-CM | POA: Diagnosis not present

## 2023-01-04 DIAGNOSIS — R002 Palpitations: Secondary | ICD-10-CM | POA: Insufficient documentation

## 2023-01-04 DIAGNOSIS — N19 Unspecified kidney failure: Secondary | ICD-10-CM | POA: Diagnosis not present

## 2023-01-04 DIAGNOSIS — R531 Weakness: Secondary | ICD-10-CM | POA: Diagnosis not present

## 2023-01-04 DIAGNOSIS — G893 Neoplasm related pain (acute) (chronic): Secondary | ICD-10-CM | POA: Diagnosis not present

## 2023-01-04 LAB — CMP (CANCER CENTER ONLY)
ALT: 23 U/L (ref 0–44)
AST: 18 U/L (ref 15–41)
Albumin: 4.4 g/dL (ref 3.5–5.0)
Alkaline Phosphatase: 52 U/L (ref 38–126)
Anion gap: 7 (ref 5–15)
BUN: 12 mg/dL (ref 6–20)
CO2: 23 mmol/L (ref 22–32)
Calcium: 9.1 mg/dL (ref 8.9–10.3)
Chloride: 108 mmol/L (ref 98–111)
Creatinine: 1.25 mg/dL — ABNORMAL HIGH (ref 0.61–1.24)
GFR, Estimated: >60 mL/min (ref >60)
Glucose, Bld: 95 mg/dL (ref 70–99)
Potassium: 3.8 mmol/L (ref 3.5–5.1)
Sodium: 138 mmol/L (ref 135–145)
Total Bilirubin: 0.5 mg/dL (ref 0.3–1.2)
Total Protein: 6.9 g/dL (ref 6.5–8.1)

## 2023-01-04 LAB — CBC WITH DIFFERENTIAL (CANCER CENTER ONLY)
Abs Immature Granulocytes: 0 10*3/uL (ref 0.00–0.07)
Basophils Absolute: 0.1 10*3/uL (ref 0.0–0.1)
Basophils Relative: 4 %
Eosinophils Absolute: 0.1 10*3/uL (ref 0.0–0.5)
Eosinophils Relative: 3 %
HCT: 40.2 % (ref 39.0–52.0)
Hemoglobin: 13.6 g/dL (ref 13.0–17.0)
Immature Granulocytes: 0 %
Lymphocytes Relative: 47 %
Lymphs Abs: 1.6 10*3/uL (ref 0.7–4.0)
MCH: 26.1 pg (ref 26.0–34.0)
MCHC: 33.8 g/dL (ref 30.0–36.0)
MCV: 77 fL — ABNORMAL LOW (ref 80.0–100.0)
Monocytes Absolute: 0.4 10*3/uL (ref 0.1–1.0)
Monocytes Relative: 11 %
Neutro Abs: 1.2 10*3/uL — ABNORMAL LOW (ref 1.7–7.7)
Neutrophils Relative %: 35 %
Platelet Count: 203 10*3/uL (ref 150–400)
RBC: 5.22 MIL/uL (ref 4.22–5.81)
RDW: 13.1 % (ref 11.5–15.5)
WBC Count: 3.4 10*3/uL — ABNORMAL LOW (ref 4.0–10.5)
nRBC: 0 % (ref 0.0–0.2)

## 2023-01-04 MED ORDER — AMLODIPINE BESYLATE 5 MG PO TABS: 5.0000 mg | ORAL_TABLET | Freq: Every day | ORAL | 2 refills | Status: DC
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
## 2023-01-05 LAB — KAPPA/LAMBDA LIGHT CHAINS
Kappa free light chain: 26.1 mg/L — ABNORMAL HIGH (ref 3.3–19.4)
Kappa, lambda light chain ratio: 2.1 — ABNORMAL HIGH (ref 0.26–1.65)
Lambda free light chains: 12.4 mg/L (ref 5.7–26.3)

## 2023-01-06 LAB — MULTIPLE MYELOMA PANEL, SERUM
Albumin SerPl Elph-Mcnc: 3.9 g/dL (ref 2.9–4.4)
Albumin/Glob SerPl: 1.4 (ref 0.7–1.7)
Alpha 1: 0.2 g/dL (ref 0.0–0.4)
Alpha2 Glob SerPl Elph-Mcnc: 0.7 g/dL (ref 0.4–1.0)
B-Globulin SerPl Elph-Mcnc: 1.1 g/dL (ref 0.7–1.3)
Gamma Glob SerPl Elph-Mcnc: 0.8 g/dL (ref 0.4–1.8)
Globulin, Total: 2.8 g/dL (ref 2.2–3.9)
IgA: 116 mg/dL (ref 90–386)
IgG (Immunoglobin G), Serum: 979 mg/dL (ref 603–1613)
IgM (Immunoglobulin M), Srm: 47 mg/dL (ref 20–172)
Total Protein ELP: 6.7 g/dL (ref 6.0–8.5)

## 2023-01-10 ENCOUNTER — Telehealth: Payer: Self-pay

## 2023-01-10 NOTE — Telephone Encounter (Signed)
-----   Message from Lonna Cobb sent at 01/07/2023  4:48 PM EDT ----- Please call him--myeloma labs are stable, follow up as scheduled

## 2023-01-10 NOTE — Telephone Encounter (Signed)
Patient gave verbal understanding and had no further questions or concerns  

## 2023-01-11 ENCOUNTER — Telehealth: Payer: Self-pay | Admitting: *Deleted

## 2023-01-11 ENCOUNTER — Inpatient Hospital Stay: Payer: Medicare Other

## 2023-01-11 DIAGNOSIS — C9000 Multiple myeloma not having achieved remission: Secondary | ICD-10-CM

## 2023-01-11 LAB — CBC WITH DIFFERENTIAL (CANCER CENTER ONLY)
Abs Immature Granulocytes: 0 10*3/uL (ref 0.00–0.07)
Basophils Absolute: 0.2 10*3/uL — ABNORMAL HIGH (ref 0.0–0.1)
Basophils Relative: 4 %
Eosinophils Absolute: 0.1 10*3/uL (ref 0.0–0.5)
Eosinophils Relative: 3 %
HCT: 40.9 % (ref 39.0–52.0)
Hemoglobin: 13.8 g/dL (ref 13.0–17.0)
Immature Granulocytes: 0 %
Lymphocytes Relative: 50 %
Lymphs Abs: 2 10*3/uL (ref 0.7–4.0)
MCH: 26.2 pg (ref 26.0–34.0)
MCHC: 33.7 g/dL (ref 30.0–36.0)
MCV: 77.8 fL — ABNORMAL LOW (ref 80.0–100.0)
Monocytes Absolute: 0.5 10*3/uL (ref 0.1–1.0)
Monocytes Relative: 11 %
Neutro Abs: 1.3 10*3/uL — ABNORMAL LOW (ref 1.7–7.7)
Neutrophils Relative %: 32 %
Platelet Count: 201 10*3/uL (ref 150–400)
RBC: 5.26 MIL/uL (ref 4.22–5.81)
RDW: 13.6 % (ref 11.5–15.5)
WBC Count: 4 10*3/uL (ref 4.0–10.5)
nRBC: 0 % (ref 0.0–0.2)

## 2023-01-11 NOTE — Telephone Encounter (Signed)
MD reviewed CBC w/ANC 1.3--OK to resume Revlimid. Derek [name] notified and will start tonight.

## 2023-01-19 ENCOUNTER — Encounter: Payer: Self-pay | Admitting: Nurse Practitioner

## 2023-01-24 ENCOUNTER — Encounter: Payer: Self-pay | Admitting: Nurse Practitioner

## 2023-02-01 ENCOUNTER — Other Ambulatory Visit: Payer: Self-pay | Admitting: *Deleted

## 2023-02-01 DIAGNOSIS — C9000 Multiple myeloma not having achieved remission: Secondary | ICD-10-CM

## 2023-02-01 MED ORDER — LENALIDOMIDE 5 MG PO CAPS: 5.0000 mg | ORAL_CAPSULE | Freq: Every day | ORAL | 0 refills | Status: DC

## 2023-02-03 ENCOUNTER — Inpatient Hospital Stay: Payer: Medicare Other | Admitting: Oncology

## 2023-02-03 ENCOUNTER — Other Ambulatory Visit: Payer: Self-pay

## 2023-02-03 ENCOUNTER — Inpatient Hospital Stay: Payer: Medicare Other

## 2023-02-03 ENCOUNTER — Inpatient Hospital Stay: Payer: Medicare Other | Attending: Oncology

## 2023-02-03 VITALS — BP 123/90 | HR 64 | Resp 18

## 2023-02-03 VITALS — BP 123/89 | HR 77 | Temp 98.1°F | Resp 20 | Ht 73.0 in | Wt 215.8 lb

## 2023-02-03 DIAGNOSIS — C9000 Multiple myeloma not having achieved remission: Secondary | ICD-10-CM | POA: Insufficient documentation

## 2023-02-03 DIAGNOSIS — I10 Essential (primary) hypertension: Secondary | ICD-10-CM | POA: Insufficient documentation

## 2023-02-03 DIAGNOSIS — R0789 Other chest pain: Secondary | ICD-10-CM | POA: Insufficient documentation

## 2023-02-03 DIAGNOSIS — M549 Dorsalgia, unspecified: Secondary | ICD-10-CM | POA: Insufficient documentation

## 2023-02-03 DIAGNOSIS — N19 Unspecified kidney failure: Secondary | ICD-10-CM | POA: Diagnosis not present

## 2023-02-03 DIAGNOSIS — R531 Weakness: Secondary | ICD-10-CM | POA: Insufficient documentation

## 2023-02-03 LAB — CBC WITH DIFFERENTIAL (CANCER CENTER ONLY)
Abs Immature Granulocytes: 0.01 10*3/uL (ref 0.00–0.07)
Basophils Absolute: 0.1 10*3/uL (ref 0.0–0.1)
Basophils Relative: 3 %
Eosinophils Absolute: 0.2 10*3/uL (ref 0.0–0.5)
Eosinophils Relative: 5 %
HCT: 41.2 % (ref 39.0–52.0)
Hemoglobin: 13.8 g/dL (ref 13.0–17.0)
Immature Granulocytes: 0 %
Lymphocytes Relative: 38 %
Lymphs Abs: 1.3 10*3/uL (ref 0.7–4.0)
MCH: 25.9 pg — ABNORMAL LOW (ref 26.0–34.0)
MCHC: 33.5 g/dL (ref 30.0–36.0)
MCV: 77.4 fL — ABNORMAL LOW (ref 80.0–100.0)
Monocytes Absolute: 0.4 10*3/uL (ref 0.1–1.0)
Monocytes Relative: 10 %
Neutro Abs: 1.6 10*3/uL — ABNORMAL LOW (ref 1.7–7.7)
Neutrophils Relative %: 44 %
Platelet Count: 174 10*3/uL (ref 150–400)
RBC: 5.32 MIL/uL (ref 4.22–5.81)
RDW: 13.8 % (ref 11.5–15.5)
WBC Count: 3.5 10*3/uL — ABNORMAL LOW (ref 4.0–10.5)
nRBC: 0 % (ref 0.0–0.2)

## 2023-02-03 LAB — CMP (CANCER CENTER ONLY)
ALT: 29 U/L (ref 0–44)
AST: 20 U/L (ref 15–41)
Albumin: 4.5 g/dL (ref 3.5–5.0)
Alkaline Phosphatase: 54 U/L (ref 38–126)
Anion gap: 8 (ref 5–15)
BUN: 12 mg/dL (ref 6–20)
CO2: 24 mmol/L (ref 22–32)
Calcium: 8.5 mg/dL — ABNORMAL LOW (ref 8.9–10.3)
Chloride: 108 mmol/L (ref 98–111)
Creatinine: 1.29 mg/dL — ABNORMAL HIGH (ref 0.61–1.24)
GFR, Estimated: >60 mL/min (ref >60)
Glucose, Bld: 102 mg/dL — ABNORMAL HIGH (ref 70–99)
Potassium: 3.4 mmol/L — ABNORMAL LOW (ref 3.5–5.1)
Sodium: 140 mmol/L (ref 135–145)
Total Bilirubin: 0.6 mg/dL (ref <1.2)
Total Protein: 6.8 g/dL (ref 6.5–8.1)

## 2023-02-03 MED ORDER — ZOLEDRONIC ACID 4 MG/100ML IV SOLN
4.0000 mg | Freq: Once | INTRAVENOUS | Status: AC
  Administered 2023-02-03: 4 mg via INTRAVENOUS
  Filled 2023-02-03: qty 100

## 2023-02-03 MED ORDER — POTASSIUM CHLORIDE CRYS ER 10 MEQ PO TBCR: 10.0000 meq | EXTENDED_RELEASE_TABLET | Freq: Two times a day (BID) | ORAL | 3 refills | Status: DC

## 2023-02-03 MED ORDER — SODIUM CHLORIDE 0.9 % IV SOLN: INTRAVENOUS | Status: DC

## 2023-02-03 MED ORDER — VITAMIN D (ERGOCALCIFEROL) 1.25 MG (50000 UNIT) PO CAPS: 50000.0000 [IU] | ORAL_CAPSULE | ORAL | 3 refills | Status: DC

## 2023-02-03 MED ORDER — SODIUM CHLORIDE 0.9% FLUSH: 10.0000 mL | Freq: Two times a day (BID) | INTRAVENOUS | Status: DC
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
## 2023-02-03 NOTE — Patient Instructions (Signed)

## 2023-02-03 NOTE — Progress Notes (Signed)
Patient seen by Dr. Truett Perna today  Vitals are within treatment parameters.  Labs reviewed by Dr. Truett Perna and are within treatment parameters. Calcium- 8.5 Per Dr. Truett Perna okay to treat.   Per physician team, patient is ready for treatment and there are NO modifications to the treatment plan.

## 2023-02-03 NOTE — Addendum Note (Signed)
Addended by: Thornton Papas B on: 02/03/2023 09:48 AM   Modules accepted: Orders

## 2023-02-04 LAB — KAPPA/LAMBDA LIGHT CHAINS
Kappa free light chain: 29.9 mg/L — ABNORMAL HIGH (ref 3.3–19.4)
Kappa, lambda light chain ratio: 2.11 — ABNORMAL HIGH (ref 0.26–1.65)
Lambda free light chains: 14.2 mg/L (ref 5.7–26.3)

## 2023-02-07 LAB — MULTIPLE MYELOMA PANEL, SERUM
Albumin SerPl Elph-Mcnc: 3.7 g/dL (ref 2.9–4.4)
Albumin/Glob SerPl: 1.4 (ref 0.7–1.7)
Alpha 1: 0.2 g/dL (ref 0.0–0.4)
Alpha2 Glob SerPl Elph-Mcnc: 0.7 g/dL (ref 0.4–1.0)
B-Globulin SerPl Elph-Mcnc: 1 g/dL (ref 0.7–1.3)
Gamma Glob SerPl Elph-Mcnc: 0.8 g/dL (ref 0.4–1.8)
Globulin, Total: 2.7 g/dL (ref 2.2–3.9)
IgA: 117 mg/dL (ref 90–386)
IgG (Immunoglobin G), Serum: 897 mg/dL (ref 603–1613)
IgM (Immunoglobulin M), Srm: 49 mg/dL (ref 20–172)
Total Protein ELP: 6.4 g/dL (ref 6.0–8.5)

## 2023-02-09 ENCOUNTER — Encounter: Payer: Self-pay | Admitting: Pediatrics

## 2023-02-22 ENCOUNTER — Telehealth: Payer: Self-pay

## 2023-02-22 NOTE — Telephone Encounter (Signed)
[employer] has contacted [place] to request a diagnosis verification form that needs to be completed and returned via fax. I have sent the completed form to the fax number [phone].

## 2023-02-24 ENCOUNTER — Inpatient Hospital Stay: Payer: Medicare Other | Attending: Oncology

## 2023-02-24 DIAGNOSIS — G893 Neoplasm related pain (acute) (chronic): Secondary | ICD-10-CM | POA: Insufficient documentation

## 2023-02-24 DIAGNOSIS — C9000 Multiple myeloma not having achieved remission: Secondary | ICD-10-CM | POA: Insufficient documentation

## 2023-02-24 DIAGNOSIS — N19 Unspecified kidney failure: Secondary | ICD-10-CM | POA: Insufficient documentation

## 2023-02-24 DIAGNOSIS — Z86718 Personal history of other venous thrombosis and embolism: Secondary | ICD-10-CM | POA: Insufficient documentation

## 2023-02-24 DIAGNOSIS — D630 Anemia in neoplastic disease: Secondary | ICD-10-CM | POA: Insufficient documentation

## 2023-02-24 DIAGNOSIS — I10 Essential (primary) hypertension: Secondary | ICD-10-CM | POA: Insufficient documentation

## 2023-02-24 DIAGNOSIS — R531 Weakness: Secondary | ICD-10-CM | POA: Insufficient documentation

## 2023-02-24 DIAGNOSIS — D709 Neutropenia, unspecified: Secondary | ICD-10-CM | POA: Insufficient documentation

## 2023-02-24 NOTE — Progress Notes (Signed)
[facility] Clinical Social Work  Clinical Social Work was referred by nurse for assessment of psychosocial needs.  Clinical Social Worker contacted patient by phone to offer support and assess for needs. Patient expressed primary need with accessing financial resources for non-medical living expenses. Patient is experiencing high stress and concern about fiancial impact post-treatment due to cost of treatment prior to being approved for medicaid. CSW informed patient of balance with [facility], as reported by Patient Financial Specialist TChilton Si, discussed Leukemia and Lymphoma society Financial Assistance, and Cancer Care. CSW received verbal authorization to email patient on email address on file ([email or web address]) resources including the LIEP program.  Unfortunately, there are limited resources available.   Marguerita Merles, LCSWA Clinical Social Worker [facility]

## 2023-03-01 ENCOUNTER — Other Ambulatory Visit: Payer: Self-pay | Admitting: *Deleted

## 2023-03-01 DIAGNOSIS — C9000 Multiple myeloma not having achieved remission: Secondary | ICD-10-CM

## 2023-03-01 MED ORDER — LENALIDOMIDE 5 MG PO CAPS: 5.0000 mg | ORAL_CAPSULE | Freq: Every day | ORAL | 0 refills | Status: DC

## 2023-03-01 NOTE — Telephone Encounter (Signed)
Fax from [facility] for refill on lenalidomide

## 2023-03-10 ENCOUNTER — Inpatient Hospital Stay: Payer: Medicare Other

## 2023-03-10 ENCOUNTER — Encounter: Payer: Self-pay | Admitting: Nurse Practitioner

## 2023-03-10 ENCOUNTER — Inpatient Hospital Stay: Payer: Medicare Other | Admitting: Nurse Practitioner

## 2023-03-10 ENCOUNTER — Other Ambulatory Visit: Payer: Medicare Other

## 2023-03-10 VITALS — BP 117/80 | HR 80 | Temp 98.1°F | Resp 18 | Ht 73.0 in | Wt 213.6 lb

## 2023-03-10 DIAGNOSIS — G893 Neoplasm related pain (acute) (chronic): Secondary | ICD-10-CM | POA: Diagnosis not present

## 2023-03-10 DIAGNOSIS — C9000 Multiple myeloma not having achieved remission: Secondary | ICD-10-CM | POA: Diagnosis not present

## 2023-03-10 DIAGNOSIS — D709 Neutropenia, unspecified: Secondary | ICD-10-CM | POA: Diagnosis not present

## 2023-03-10 DIAGNOSIS — I10 Essential (primary) hypertension: Secondary | ICD-10-CM | POA: Diagnosis not present

## 2023-03-10 DIAGNOSIS — Z86718 Personal history of other venous thrombosis and embolism: Secondary | ICD-10-CM | POA: Diagnosis not present

## 2023-03-10 DIAGNOSIS — D630 Anemia in neoplastic disease: Secondary | ICD-10-CM | POA: Diagnosis not present

## 2023-03-10 DIAGNOSIS — R531 Weakness: Secondary | ICD-10-CM | POA: Diagnosis not present

## 2023-03-10 DIAGNOSIS — N19 Unspecified kidney failure: Secondary | ICD-10-CM | POA: Diagnosis not present

## 2023-03-10 LAB — CBC WITH DIFFERENTIAL (CANCER CENTER ONLY)
Abs Immature Granulocytes: 0 10*3/uL (ref 0.00–0.07)
Basophils Absolute: 0.1 10*3/uL (ref 0.0–0.1)
Basophils Relative: 2 %
Eosinophils Absolute: 0.1 10*3/uL (ref 0.0–0.5)
Eosinophils Relative: 3 %
HCT: 40.9 % (ref 39.0–52.0)
Hemoglobin: 13.5 g/dL (ref 13.0–17.0)
Immature Granulocytes: 0 %
Lymphocytes Relative: 40 %
Lymphs Abs: 1.6 10*3/uL (ref 0.7–4.0)
MCH: 25.9 pg — ABNORMAL LOW (ref 26.0–34.0)
MCHC: 33 g/dL (ref 30.0–36.0)
MCV: 78.5 fL — ABNORMAL LOW (ref 80.0–100.0)
Monocytes Absolute: 0.5 10*3/uL (ref 0.1–1.0)
Monocytes Relative: 14 %
Neutro Abs: 1.6 10*3/uL — ABNORMAL LOW (ref 1.7–7.7)
Neutrophils Relative %: 41 %
Platelet Count: 212 10*3/uL (ref 150–400)
RBC: 5.21 MIL/uL (ref 4.22–5.81)
RDW: 14.3 % (ref 11.5–15.5)
WBC Count: 4 10*3/uL (ref 4.0–10.5)
nRBC: 0 % (ref 0.0–0.2)

## 2023-03-10 LAB — CMP (CANCER CENTER ONLY)
ALT: 38 U/L (ref 0–44)
AST: 25 U/L (ref 15–41)
Albumin: 4.3 g/dL (ref 3.5–5.0)
Alkaline Phosphatase: 57 U/L (ref 38–126)
Anion gap: 7 (ref 5–15)
BUN: 14 mg/dL (ref 6–20)
CO2: 25 mmol/L (ref 22–32)
Calcium: 8.7 mg/dL — ABNORMAL LOW (ref 8.9–10.3)
Chloride: 107 mmol/L (ref 98–111)
Creatinine: 1.29 mg/dL — ABNORMAL HIGH (ref 0.61–1.24)
GFR, Estimated: >60 mL/min
Glucose, Bld: 78 mg/dL (ref 70–99)
Potassium: 4.1 mmol/L (ref 3.5–5.1)
Sodium: 139 mmol/L (ref 135–145)
Total Bilirubin: 0.6 mg/dL
Total Protein: 6.8 g/dL (ref 6.5–8.1)

## 2023-03-10 LAB — MAGNESIUM: Magnesium: 1.8 mg/dL (ref 1.7–2.4)
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
## 2023-03-11 LAB — KAPPA/LAMBDA LIGHT CHAINS
Kappa free light chain: 26.1 mg/L — ABNORMAL HIGH (ref 3.3–19.4)
Kappa, lambda light chain ratio: 1.78 — ABNORMAL HIGH (ref 0.26–1.65)
Lambda free light chains: 14.7 mg/L (ref 5.7–26.3)

## 2023-03-21 ENCOUNTER — Encounter: Payer: Self-pay | Admitting: Oncology

## 2023-03-23 ENCOUNTER — Encounter: Payer: Self-pay | Admitting: Oncology

## 2023-03-23 HISTORY — PX: COLONOSCOPY WITH ESOPHAGOGASTRODUODENOSCOPY (EGD) AND ESOPHAGEAL DILATION (ED): SHX6495

## 2023-03-27 ENCOUNTER — Telehealth: Payer: Self-pay

## 2023-03-27 NOTE — Telephone Encounter (Signed)
 Oral Oncology Patient Advocate Encounter  Was successful in securing patient a $12,000.00 grant from [facility] to provide copayment coverage for Lenalidomide .  This will keep the out of pocket expense at $0.     [employer] ID: [id number]   The billing information is as follows and has been shared with [facility].    RxBin: [id number] PCN: [id number] Member ID: [id number] Group ID: [id number] Dates of Eligibility: 01/17/23 through 01/16/24  Fund:  Multiple Myeloma - Medicare Access   Morene Potters, CPhT Oncology Pharmacy Patient Advocate  [facility]  [phone] (phone) [phone] (fax)

## 2023-03-27 NOTE — Progress Notes (Signed)
 +  [facility] Initial Consultation   Referring Provider Loris Elsie PARAS, PA-C [id number] [place],  [place] [address]  Primary Care Provider Loris Elsie PARAS, PA-C  Patient Profile: [name] is a 48 y.o. male with a history of multiple myeloma status post autologous stem cell transplant for 2021, AKI/ARF, hypertension, LLE DVT 2014 and superficial thrombosis left greater saphenous vein 2020 (no longer on anticoagulation) who is seen in consultation in the [facility] at the request of Dr. Loris for evaluation and management of the problem(s) noted below.  Problem List: Dysphagia/? GERD Chronic diarrhea secondary to Revlimid  Colon cancer screening   History of Present Illness   Derek [name] is a 48 y.o. male with a history of multiple myeloma status post autologous stem cell transplant for 2021, AKI/ARF, hypertension, LLE DVT 2014 and superficial thrombosis left greater saphenous vein 2020 (no longer on anticoagulation)  who presents for evaluation of GERD/dysphagia and to discuss colon cancer screening  GERD/dysphagia Since stem cell transplant 2021 describes vomiting/regurgitation only of carbonated beverages -can no longer drink soda/Sprite Also notes sensation of potassium pills getting stuck in his throat/esophagus Otherwise denies dysphagia to other solids or liquids Has a history of previous GERD symptoms (noted bile in throat) treated with OTC Pepcid and spontaneously resolved Not currently endorsing GERD symptoms, regurgitation, pyrosis States that he has difficulty belching and needs to move his head a certain way in order to expel gas Remote history of thrush but no Candida esophagitis No previous EGD No family history of esophageal cancer  Chronic diarrhea Endorses loose watery stool since initiation of Revlimid  for multiple myeloma No blood or mucus in stool  Colon cancer screening No previous colonoscopy No family  history of colorectal cancer or polyps Denies changes in bowel habits other than medication associated diarrhea as noted above Notes he has some concern regarding anesthesia for the procedure -indicates he has been awake after receiving sedation for bone biopsies and procedures related to myeloma -red GERD suggest received fentanyl  and Versed  Has a previous history of DVT and no longer on anticoagulation  Last colonoscopy: None Last endoscopy: None  Last Abd CT/CTE/MRE: Whole body PET 12/2021 -no pertinent findings in GI tract  GI Review of Symptoms Significant for GERD, dysphagia and diarrhea. Otherwise negative.  General Review of Systems  Review of systems is significant for the pertinent positives and negatives as listed per the HPI.  Full ROS is otherwise negative.  Past Medical History   Past Medical History:  Diagnosis Date   Acute deep vein thrombosis (DVT) of left lower extremity (HCC)    Depression    Hypertension    Multiple myeloma (HCC)       Past Surgical History   Past Surgical History:  Procedure Laterality Date   BONE BIOPSY     Wisdom Tooth removal        Allergies and Medications   Allergies  Allergen Reactions   Other Nausea And Vomiting   Pork-Derived Products Nausea And Vomiting   Aspirin Hives and Anxiety     Current Outpatient Medications:    amLODipine  (NORVASC ) 5 MG tablet, Take 1 tablet (5 mg total) by mouth daily., Disp: 30 tablet, Rfl: 2   Biotin 1 MG CAPS, Take by mouth., Disp: , Rfl:    cyanocobalamin 2000 MCG tablet, Take 2,000 mcg by mouth daily., Disp: , Rfl:    cyclobenzaprine  (FLEXERIL ) 5 MG tablet, TAKE 1 TABLET TWICE DAILY AS NEEDED FOR MUSCLE  SPASMS, Disp: 30 tablet, Rfl: 0   diphenoxylate -atropine  (LOMOTIL ) 2.5-0.025 MG tablet, TAKE 1 TABLET BY MOUTH AFER EACH LOOSE STOOL UP TO 6 TIMES PER DAY, Disp: 60 tablet, Rfl: 4   HYDROmorphone  (DILAUDID ) 2 MG tablet, Take 1 tablet (2 mg total) by mouth every 6 (six) hours as needed for  severe pain., Disp: 60 tablet, Rfl: 0   labetalol (NORMODYNE) 200 MG tablet, Take by mouth., Disp: , Rfl:    lenalidomide  (REVLIMID ) 5 MG capsule, Take 1 capsule (5 mg total) by mouth daily. Take one by mouth daily x 21 days on and 7 day rest. Repeat every 28 days. Start cycle on 03/09/2023, Disp: 21 capsule, Rfl: 0   magnesium oxide (MAG-OX) 400 (240 Mg) MG tablet, Take 200 mg by mouth daily., Disp: , Rfl:    Multiple Vitamin (MULTIVITAMIN) tablet, Take 1 tablet by mouth daily., Disp: , Rfl:    Na Sulfate-K Sulfate-Mg Sulf 17.5-3.13-1.6 GM/177ML SOLN, Take 1 kit by mouth once for 1 dose., Disp: 354 mL, Rfl: 0   potassium chloride  (KLOR-CON  M) 10 MEQ tablet, Take 1 tablet (10 mEq total) by mouth 2 (two) times daily., Disp: 60 tablet, Rfl: 3   tadalafil (CIALIS) 20 MG tablet, Take 20 mg by mouth daily as needed., Disp: , Rfl:    triamcinolone ointment (KENALOG) 0.1 %, Apply topically., Disp: , Rfl:    Vitamin D , Ergocalciferol , (DRISDOL ) 1.25 MG (50000 UNIT) CAPS capsule, Take 1 capsule (50,000 Units total) by mouth every 7 (seven) days., Disp: 4 capsule, Rfl: 3   Family History   Family History  Problem Relation Age of Onset   Diabetes Maternal Grandmother    Heart disease Maternal Uncle     No colorectal cancer or polyps  Social History   Social History   Social History Narrative   Single, 2 children   Previous cigarette smoker -consumes 1 pack of cigarettes per week and quit in 2007   No current alcohol use -ceased consuming alcohol in 2007   Previously employed as a production designer, theatre/television/film at [employer] -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   MR Derek reports that he quit smoking about 18 years ago. His smoking use included cigarettes. He has never used smokeless tobacco. He reports that he does not drink alcohol and does not use drugs.  Vital Signs and Physical Examination   Vitals:   03/28/23 1045  BP: 106/74  Pulse: 80  SpO2: 98%   Body mass index is 28.5  kg/m. Weight: 216 lb (98 kg)  Constitutional: well-nourished appearing nontoxic appearing 48 y.o. male in no apparent distress.   Eyes: Anicteric sclerae Head: Normocephalic/atraumatic Neck: No masses Pulmonary: Clear to auscultation bilaterally CV: S1, S2, regular rate and rhythm GI/Abdomen: Soft, tender to palpation in the right upper quadrant adjacent to the costal margin by the rib (patient states this is chronic pain related to myeloma), no distention Anorectal: Deferred Neuro: Grossly normal Skin: Normal Bilateral Extremities: No edema   Review of Data  The following data was reviewed at the time of this encounter:  Laboratory Studies   Lab Results  Component Value Date   WBC 4.0 03/10/2023   HGB 13.5 03/10/2023   HCT 40.9 03/10/2023   MCV 78.5 (L) 03/10/2023   PLT 212 03/10/2023     Chemistry      Component Value Date/Time   NA 139 03/10/2023 1025   K 4.1 03/10/2023 1025   CL 107 03/10/2023 1025   CO2 25 03/10/2023 1025  BUN 14 03/10/2023 1025   CREATININE 1.29 (H) 03/10/2023 1025      Component Value Date/Time   CALCIUM 8.7 (L) 03/10/2023 1025   CALCIUM 13.0 (H) 11/21/2018 0923   ALKPHOS 57 03/10/2023 1025   AST 25 03/10/2023 1025   ALT 38 03/10/2023 1025   BILITOT 0.6 03/10/2023 1025      Lab Results  Component Value Date   TSH 1.670 10/19/2021     Imaging Studies  Whole-body PET 12/2021 1. No evidence for hypermetabolic myeloma on today's study. Specifically, the expansile right third rib lesion shows no hypermetabolism with uptake in this region at background marrow activity. No hypermetabolic lesion in the right chest wall. 2. Symmetric uptake identified in the region of the mandibular angle bilaterally without underlying bony or soft tissue lesion at this location. Apparent small level II lymph nodes bilaterally show some mild hypermetabolism, right greater than left. No lymphadenopathy by CT size criteria. Potentially reactive,  follow-up may be warranted. Neck CT with contrast could be used to better evaluate lymph node size as clinically warranted.    GI Procedures and Studies  None    Clinical Impression  It is my clinical impression that Derek [name] is a 48 y.o. male with a history of multiple myeloma status post autologous stem cell transplant for 2021, AKI/ARF, hypertension, LLE DVT 2014 and superficial thrombosis left greater saphenous vein 2020 (no longer on anticoagulation) who presents for evaluation and management of:  Dysphagia/? GERD Chronic diarrhea secondary to Revlimid  Colon cancer screening  Derek [name] describes regurgitation of carbonated beverages and pill dysphagia since his stem cell transplant 2021.  Denies any difficulty swallowing other liquids or food.  Has a remote history of GERD type symptoms in the past but not currently endorsing any active GERD.  He has had thrush in the past but no Candida esophagitis as a complication of his stem cell transplant.  Given that symptoms are specifically related to carbonated beverages and medication effect stricture seems less likely.  Esophageal dysmotility may be a consideration.  At today's visit we discussed performing a diagnostic upper endoscopy for further evaluation of his symptoms.  In the absence of typical GERD symptoms have deferred trialing antacid therapy.  He reports chronic diarrhea with a temporal onset related to Revlimid  and states this is a common side effect.  He will be undergoing screening colonoscopy and at the time of colonoscopy would recommend terminal ileal intubation and performing random colon biopsies to rule out microscopic colitis.  He is amenable to colonoscopy for colon cancer screening.  He did express concern regarding being sedated enough for the procedure.  Advised that he would receive monitored anesthesia care with propofol.  He reports that he has been awake during previous bone biopsies -chart review suggest that  these were primarily performed under moderate sedation with Versed  and fentanyl .  Plan  Schedule diagnostic EGD for further evaluation of dysphagia; consider gastric and duodenal biopsies given history of right upper quadrant abdominal pain and diarrhea Schedule screening colonoscopy as patient is now due.  He has incidental symptoms of chronic diarrhea related to Revlimid  -advised TI intubation and random colon biopsies to rule out microscopic colitis Further recommendations pending results of EGD and colonoscopy.  Planned Follow Up 2-3 months  The patient or caregiver verbalized understanding of the material covered, with no barriers to understanding. All questions were answered. Patient or caregiver is agreeable with the plan outlined above.    It was a pleasure to see  MR [name].  If you have any questions or concerns regarding this evaluation, do not hesitate to contact me.   Inocente Hausen, MD [facility]  I spent total of 45 minutes in both face-to-face and non-face-to-face activities, excluding procedures performed, for the visit on the date of this encounter.

## 2023-03-28 ENCOUNTER — Ambulatory Visit: Payer: Medicare Other | Admitting: Pediatrics

## 2023-03-28 ENCOUNTER — Encounter: Payer: Self-pay | Admitting: Pediatrics

## 2023-03-28 VITALS — BP 106/74 | HR 80 | Ht 73.0 in | Wt 216.0 lb

## 2023-03-28 DIAGNOSIS — R197 Diarrhea, unspecified: Secondary | ICD-10-CM

## 2023-03-28 DIAGNOSIS — Z1211 Encounter for screening for malignant neoplasm of colon: Secondary | ICD-10-CM

## 2023-03-28 DIAGNOSIS — R1310 Dysphagia, unspecified: Secondary | ICD-10-CM | POA: Diagnosis not present

## 2023-03-28 DIAGNOSIS — K529 Noninfective gastroenteritis and colitis, unspecified: Secondary | ICD-10-CM | POA: Diagnosis not present

## 2023-03-28 DIAGNOSIS — K219 Gastro-esophageal reflux disease without esophagitis: Secondary | ICD-10-CM | POA: Diagnosis not present

## 2023-03-28 MED ORDER — NA SULFATE-K SULFATE-MG SULF 17.5-3.13-1.6 GM/177ML PO SOLN: 1.0000 | Freq: Once | ORAL | 0 refills | Status: AC

## 2023-03-28 NOTE — Patient Instructions (Signed)
 You have been scheduled for an endoscopy and colonoscopy. Please follow the written instructions given to you at your visit today.  Please pick up your prep supplies at the pharmacy within the next 1-3 days.  If you use inhalers (even only as needed), please bring them with you on the day of your procedure.  DO NOT TAKE 7 DAYS PRIOR TO TEST- Trulicity (dulaglutide) Ozempic, Wegovy (semaglutide) Mounjaro (tirzepatide) Bydureon Bcise (exanatide extended release)  DO NOT TAKE 1 DAY PRIOR TO YOUR TEST Rybelsus (semaglutide) Adlyxin (lixisenatide) Victoza (liraglutide) Byetta (exanatide)  If your blood pressure at your visit was 140/90 or greater, please contact your primary care physician to follow up on this.  _______________________________________________________  If you are age 51 or older, your body mass index should be between 23-30. Your Body mass index is 28.5 kg/m. If this is out of the aforementioned range listed, please consider follow up with your Primary Care Provider.  If you are age 80 or younger, your body mass index should be between 19-25. Your Body mass index is 28.5 kg/m. If this is out of the aformentioned range listed, please consider follow up with your Primary Care Provider.   ________________________________________________________  The [facility] providers would like to encourage you to use MYCHART to communicate with providers for non-urgent requests or questions.  Due to long hold times on the telephone, sending your provider a message by [facility] may be a faster and more efficient way to get a response.  Please allow 48 business hours for a response.  Please remember that this is for non-urgent requests.  _______________________________________________________

## 2023-04-01 ENCOUNTER — Other Ambulatory Visit: Payer: Self-pay | Admitting: *Deleted

## 2023-04-01 DIAGNOSIS — C9000 Multiple myeloma not having achieved remission: Secondary | ICD-10-CM

## 2023-04-01 MED ORDER — LENALIDOMIDE 5 MG PO CAPS: 5.0000 mg | ORAL_CAPSULE | Freq: Every day | ORAL | 0 refills | Status: DC

## 2023-04-01 NOTE — Telephone Encounter (Signed)
 VM from [facility] requesting refill on lenalidomide 5 mg. Confirmed w/Mr Stephannie Peters he took his last pill on 03/31/23. Will order to start next cycle on 1/21 after MD visit.

## 2023-04-03 ENCOUNTER — Encounter: Payer: Self-pay | Admitting: Certified Registered Nurse Anesthetist

## 2023-04-04 ENCOUNTER — Encounter: Payer: Self-pay | Admitting: Oncology

## 2023-04-08 ENCOUNTER — Ambulatory Visit: Payer: Medicare Other | Admitting: Pediatrics

## 2023-04-08 ENCOUNTER — Encounter: Payer: Self-pay | Admitting: Pediatrics

## 2023-04-08 VITALS — BP 139/88 | HR 69 | Temp 97.9°F | Resp 10 | Ht 73.0 in | Wt 216.0 lb

## 2023-04-08 DIAGNOSIS — K648 Other hemorrhoids: Secondary | ICD-10-CM

## 2023-04-08 DIAGNOSIS — K219 Gastro-esophageal reflux disease without esophagitis: Secondary | ICD-10-CM | POA: Diagnosis not present

## 2023-04-08 DIAGNOSIS — R197 Diarrhea, unspecified: Secondary | ICD-10-CM

## 2023-04-08 DIAGNOSIS — R1310 Dysphagia, unspecified: Secondary | ICD-10-CM

## 2023-04-08 DIAGNOSIS — K529 Noninfective gastroenteritis and colitis, unspecified: Secondary | ICD-10-CM | POA: Diagnosis not present

## 2023-04-08 DIAGNOSIS — Z1211 Encounter for screening for malignant neoplasm of colon: Secondary | ICD-10-CM

## 2023-04-08 MED ORDER — SODIUM CHLORIDE 0.9 % IV SOLN: 500.0000 mL | INTRAVENOUS | Status: DC

## 2023-04-08 NOTE — Progress Notes (Signed)
1255 Robinul 0.1 mg IV given due large amount of secretions upon assessment.  MD made aware, vss

## 2023-04-08 NOTE — Patient Instructions (Addendum)
Resume all of your previous medications today as ordered.  Read all of your discharge instructions.  YOU HAD AN ENDOSCOPIC PROCEDURE TODAY AT THE [facility]:   Refer to the procedure report that was given to you for any specific questions about what was found during the examination.  If the procedure report does not answer your questions, please call your gastroenterologist to clarify.  If you requested that your care partner not be given the details of your procedure findings, then the procedure report has been included in a sealed envelope for you to review at your convenience later.  YOU SHOULD EXPECT: Some feelings of bloating in the abdomen. Passage of more gas than usual.  Walking can help get rid of the air that was put into your GI tract during the procedure and reduce the bloating. If you had a lower endoscopy (such as a colonoscopy or flexible sigmoidoscopy) you may notice spotting of blood in your stool or on the toilet paper. If you underwent a bowel prep for your procedure, you may not have a normal bowel movement for a few days.  Please Note:  You might notice some irritation and congestion in your nose or some drainage.  This is from the oxygen used during your procedure.  There is no need for concern and it should clear up in a day or so.  SYMPTOMS TO REPORT IMMEDIATELY:  Following lower endoscopy (colonoscopy or flexible sigmoidoscopy):  Excessive amounts of blood in the stool  Significant tenderness or worsening of abdominal pains  Swelling of the abdomen that is new, acute  Fever of 100F or higher  Following upper endoscopy (EGD)  Vomiting of blood or coffee ground material  New chest pain or pain under the shoulder blades  Painful or persistently difficult swallowing  New shortness of breath  Fever of 100F or higher  Black, tarry-looking stools  For urgent or emergent issues, a gastroenterologist can be reached at any hour by calling [phone]. Do not  use MyChart messaging for urgent concerns.    DIET:  We do recommend a small meal at first, but then you may proceed to your regular diet.  Drink plenty of fluids but you should avoid alcoholic beverages for 24 hours.  ACTIVITY:  You should plan to take it easy for the rest of today and you should NOT DRIVE or use heavy machinery until tomorrow (because of the sedation medicines used during the test).    FOLLOW UP: Our staff will call the number listed on your records the next business day following your procedure.  We will call around 7:15- 8:00 am to check on you and address any questions or concerns that you may have regarding the information given to you following your procedure. If we do not reach you, we will leave a message.     If any biopsies were taken you will be contacted by phone or by letter within the next 1-3 weeks.  Please call us at [phone] if you have not heard about the biopsies in 3 weeks.    SIGNATURES/CONFIDENTIALITY: You and/or your care partner have signed paperwork which will be entered into your electronic medical record.  These signatures attest to the fact that that the information above on your After Visit Summary has been reviewed and is understood.  Full responsibility of the confidentiality of this discharge information lies with you and/or your care-partner.

## 2023-04-08 NOTE — Progress Notes (Signed)
Zofran 0.4mg  IV diluted with 5 ml of NS over a 3 minute period per order of Dr. Doy Hutching Given at 1410.   1420, pt states that he feels a lot better.

## 2023-04-08 NOTE — Progress Notes (Signed)
Called to room to assist during endoscopic procedure.  Patient ID and intended procedure confirmed with present staff. Received instructions for my participation in the procedure from the performing physician.  

## 2023-04-08 NOTE — Op Note (Signed)
[facility] Patient Name: Derek [name] Procedure Date: 04/08/2023 1:02 PM MRN: [id number] Endoscopist: Maren Beach , MD,  Age: 48 Referring MD:  Date of Birth: Jun 07, 1975 Gender: Male Account #: [id number] Procedure:                Upper GI endoscopy Indications:              Abdominal pain in the right upper quadrant,                            Dysphagia, Diarrhea, History of multiple myeloma                            s/p stem cell transplant Medicines:                Monitored Anesthesia Care Procedure:                Pre-Anesthesia Assessment:                           - Prior to the procedure, a History and Physical                            was performed, and patient medications and                            allergies were reviewed. The patient's tolerance of                            previous anesthesia was also reviewed. The risks                            and benefits of the procedure and the sedation                            options and risks were discussed with the patient.                            All questions were answered, and informed consent                            was obtained. Prior Anticoagulants: The patient has                            taken no anticoagulant or antiplatelet agents. ASA                            Grade Assessment: III - A patient with severe                            systemic disease. After reviewing the risks and                            benefits, the patient was deemed in satisfactory  condition to undergo the procedure.                           After obtaining informed consent, the endoscope was                            passed under direct vision. Throughout the                            procedure, the patient's blood pressure, pulse, and                            oxygen saturations were monitored continuously. The                            Olympus Scope [id number] was introduced  through the                            mouth, and advanced to the second part of duodenum.                            The upper GI endoscopy was accomplished without                            difficulty. The patient tolerated the procedure                            well. Scope In: Scope Out: Findings:                 No endoscopic abnormality was evident in the                            esophagus to explain the patient's complaint of                            dysphagia. The cricopharyngeus was mildly tight                            during passage of the endoscope but no stricture                            was seen. It was decided, however, to proceed with                            dilation of the entire esophagus. A guidewire was                            placed and the scope was withdrawn. Dilation was                            performed with a Savary dilator with no resistance  at 14 mm, 15 mm and 16 mm. The dilation site was                            examined following endoscope reinsertion and showed                            no change.                           The gastric body, gastric antrum, cardia (on                            retroflexion) and gastric fundus (on retroflexion)                            were normal. Biopsies were taken with a cold                            forceps for Helicobacter pylori testing.                           The duodenal bulb and second portion of the                            duodenum were normal. Biopsies were taken with a                            cold forceps for histology. Complications:            No immediate complications. Estimated blood loss:                            Minimal. Estimated Blood Loss:     Estimated blood loss was minimal. Impression:               - No endoscopic esophageal abnormality to explain                            patient's dysphagia. Esophagus dilated to 16 mm                             empirically.                           - Normal gastric body, antrum, cardia and gastric                            fundus. Biopsied.                           - Normal duodenal bulb and second portion of the                            duodenum. Biopsied. Recommendation:           - Await pathology results.                           -  Perform a colonoscopy today.                           - The findings and recommendations were discussed                            with the patient's family.                           - Return to GI clinic as previously scheduled. Maren Beach, MD 04/08/2023 1:59:43 PM

## 2023-04-08 NOTE — Progress Notes (Signed)
Report given to PACU, vss 

## 2023-04-08 NOTE — Op Note (Signed)
[facility] Patient Name: Derek [name] Procedure Date: 04/08/2023 1:01 PM MRN: [id number] Endoscopist: Maren Beach , MD,  Age: 48 Referring MD:  Date of Birth: 1975-06-04 Gender: Male Account #: [id number] Procedure:                Colonoscopy Indications:              Screening for colorectal malignant neoplasm, This                            is the patient's first colonoscopy, Incidental                            diarrhea noted Medicines:                Monitored Anesthesia Care Procedure:                Pre-Anesthesia Assessment:                           - Prior to the procedure, a History and Physical                            was performed, and patient medications and                            allergies were reviewed. The patient's tolerance of                            previous anesthesia was also reviewed. The risks                            and benefits of the procedure and the sedation                            options and risks were discussed with the patient.                            All questions were answered, and informed consent                            was obtained. Prior Anticoagulants: The patient has                            taken no anticoagulant or antiplatelet agents. ASA                            Grade Assessment: III - A patient with severe                            systemic disease. After reviewing the risks and                            benefits, the patient was deemed in satisfactory  condition to undergo the procedure.                           After obtaining informed consent, the colonoscope                            was passed under direct vision. Throughout the                            procedure, the patient's blood pressure, pulse, and                            oxygen saturations were monitored continuously. The                            Olympus Scope [id number] was introduced through the                             anus and advanced to the the terminal ileum. The                            colonoscopy was performed without difficulty. The                            patient tolerated the procedure well. The quality                            of the bowel preparation was evaluated using the                            BBPS [facility] Bowel Preparation Scale) with scores                            of: Right Colon = 3, Transverse Colon = 3 and Left                            Colon = 3 (entire mucosa seen well with no residual                            staining, small fragments of stool or opaque                            liquid). The total BBPS score equals 9. Scope In: 1:37:37 PM Scope Out: 1:49:38 PM Scope Withdrawal Time: 0 hours 8 minutes 46 seconds  Total Procedure Duration: 0 hours 12 minutes 1 second  Findings:                 The perianal and digital rectal examinations were                            normal. Pertinent negatives include normal                            sphincter  tone and no palpable rectal lesions.                           The colon (entire examined portion) appeared                            normal. Biopsies for histology were taken with a                            cold forceps for evaluation of microscopic colitis.                           The terminal ileum appeared normal.                           Internal hemorrhoids were found during retroflexion. Complications:            No immediate complications. Estimated blood loss:                            Minimal. Estimated Blood Loss:     Estimated blood loss was minimal. Impression:               - The entire examined colon is normal. Biopsied.                           - The examined portion of the ileum was normal.                           - Internal hemorrhoids. Recommendation:           - Discharge patient to home (ambulatory).                           - Await pathology results.                            - Repeat colonoscopy in 10 years for screening                            purposes.                           - The findings and recommendations were discussed                            with the patient's family.                           - Return to GI clinic as previously scheduled.                           - Patient has a contact number available for                            emergencies. The signs and symptoms of potential  delayed complications were discussed with the                            patient. Return to normal activities tomorrow.                            Written discharge instructions were provided to the                            patient. Maren Beach, MD 04/08/2023 2:02:58 PM

## 2023-04-08 NOTE — Progress Notes (Signed)
[facility] History and Physical   Primary Care Physician:  Verlee Rossetti, PA-C   Reason for Procedure:  Dysphagia, GERD, right upper quadrant abdominal pain; average risk screening colonoscopy-no previous colonoscopy  Plan:    Diagnostic upper endoscopy and average risk screening colonoscopy     HPI: [name] is a 48 y.o. male undergoing diagnostic upper endoscopy for evaluation of GERD, dysphagia and right upper quadrant abdominal pain.  Patient endorses symptoms of vomiting/regurgitation with the consumption of carbonated beverages and potassium pills.  Can otherwise tolerate oral intake.  Reports previous history of GERD but not currently active.  He is due for average risk screening colonoscopy.  No previous colonoscopy.  No family history of colorectal cancer.  Patient notes incidental findings of chronic diarrhea that he believes are related to taking Revlimid.   Past Medical History:  Diagnosis Date   Acute deep vein thrombosis (DVT) of left lower extremity (HCC)    Depression    Hypertension    Multiple myeloma (HCC)     Past Surgical History:  Procedure Laterality Date   BONE BIOPSY     Wisdom Tooth removal      Prior to Admission medications   Medication Sig Start Date End Date Taking? Authorizing Provider  amLODipine (NORVASC) 5 MG tablet Take 1 tablet (5 mg total) by mouth daily. 01/04/23   Rana Snare, NP  Biotin 1 MG CAPS Take by mouth.    [provider]  cyanocobalamin 2000 MCG tablet Take 2,000 mcg by mouth daily.    [provider]  cyclobenzaprine (FLEXERIL) 5 MG tablet TAKE 1 TABLET TWICE DAILY AS NEEDED FOR MUSCLE SPASMS 11/19/22   Ladene Artist, MD  diphenoxylate-atropine (LOMOTIL) 2.5-0.025 MG tablet TAKE 1 TABLET BY MOUTH AFER EACH LOOSE STOOL UP TO 6 TIMES PER DAY 10/14/22   Ladene Artist, MD  HYDROmorphone (DILAUDID) 2 MG tablet Take 1 tablet (2 mg total) by mouth every 6 (six) hours as needed for  severe pain. 09/15/22   Ladene Artist, MD  labetalol (NORMODYNE) 200 MG tablet Take by mouth. 06/24/21   [provider]  lenalidomide (REVLIMID) 5 MG capsule Take 1 capsule (5 mg total) by mouth daily. Take one by mouth daily x 21 days on and 7 day rest. Repeat every 28 days. Start cycle on 04/12/23 04/01/23   Ladene Artist, MD  magnesium oxide (MAG-OX) 400 (240 Mg) MG tablet Take 200 mg by mouth daily. 01/12/23   [provider]  Multiple Vitamin (MULTIVITAMIN) tablet Take 1 tablet by mouth daily.    [provider]  potassium chloride (KLOR-CON M) 10 MEQ tablet Take 1 tablet (10 mEq total) by mouth 2 (two) times daily. 02/03/23   Ladene Artist, MD  tadalafil (CIALIS) 20 MG tablet Take 20 mg by mouth daily as needed. 11/09/21   [provider]  triamcinolone ointment (KENALOG) 0.1 % Apply topically. 10/10/21   [provider]  Vitamin D, Ergocalciferol, (DRISDOL) 1.25 MG (50000 UNIT) CAPS capsule Take 1 capsule (50,000 Units total) by mouth every 7 (seven) days. 02/03/23   Ladene Artist, MD    Current Outpatient Medications  Medication Sig Dispense Refill   amLODipine (NORVASC) 5 MG tablet Take 1 tablet (5 mg total) by mouth daily. 30 tablet 2   cyanocobalamin 2000 MCG tablet Take 2,000 mcg by mouth daily.     diphenoxylate-atropine (LOMOTIL) 2.5-0.025 MG tablet TAKE 1 TABLET BY MOUTH AFER EACH LOOSE  STOOL UP TO 6 TIMES PER DAY 60 tablet 4   labetalol (NORMODYNE) 200 MG tablet Take by mouth.     magnesium oxide (MAG-OX) 400 (240 Mg) MG tablet Take 200 mg by mouth daily.     Multiple Vitamin (MULTIVITAMIN) tablet Take 1 tablet by mouth daily.     potassium chloride (KLOR-CON M) 10 MEQ tablet Take 1 tablet (10 mEq total) by mouth 2 (two) times daily. 60 tablet 3   tadalafil (CIALIS) 20 MG tablet Take 20 mg by mouth daily as needed.     Vitamin D, Ergocalciferol, (DRISDOL) 1.25 MG (50000 UNIT) CAPS capsule Take 1 capsule (50,000 Units total) by  mouth every 7 (seven) days. 4 capsule 3   Biotin 1 MG CAPS Take by mouth.     cyclobenzaprine (FLEXERIL) 5 MG tablet TAKE 1 TABLET TWICE DAILY AS NEEDED FOR MUSCLE SPASMS 30 tablet 0   HYDROmorphone (DILAUDID) 2 MG tablet Take 1 tablet (2 mg total) by mouth every 6 (six) hours as needed for severe pain. 60 tablet 0   lenalidomide (REVLIMID) 5 MG capsule Take 1 capsule (5 mg total) by mouth daily. Take one by mouth daily x 21 days on and 7 day rest. Repeat every 28 days. Start cycle on 04/12/23 (Patient not taking: Reported on 04/08/2023) 21 capsule 0   triamcinolone ointment (KENALOG) 0.1 % Apply topically. (Patient not taking: Reported on 04/08/2023)     Current Facility-Administered Medications  Medication Dose Route Frequency Provider Last Rate Last Admin   0.9 %  sodium chloride infusion  500 mL Intravenous Continuous Kilie Rund, Durene Romans, MD        Allergies as of 04/08/2023 - Review Complete 04/08/2023  Allergen Reaction Noted   Pork-derived products Nausea And Vomiting 11/22/2018   Aspirin Hives and Anxiety 01/30/2013    Family History  Problem Relation Age of Onset   Heart disease Maternal Uncle    Diabetes Maternal Grandmother    Colon cancer Neg Hx    Colon polyps Neg Hx    Esophageal cancer Neg Hx    Rectal cancer Neg Hx    Stomach cancer Neg Hx     Social History   Socioeconomic History   Marital status: Single    Spouse name: Not on file   Number of children: 2   Years of education: Not on file   Highest education level: Not on file  Occupational History   Not on file  Tobacco Use   Smoking status: Former    Current packs/day: 0.00    Types: Cigarettes    Quit date: 03/22/2005    Years since quitting: 18.0   Smokeless tobacco: Never  Vaping Use   Vaping status: Never Used  Substance and Sexual Activity   Alcohol use: No   Drug use: No   Sexual activity: Not on file  Other Topics Concern   Not on file  Social History Narrative   Single, 2 children   Previous  cigarette smoker -consumes 1 pack of cigarettes per week and quit in 2007   No current alcohol use -ceased consuming alcohol in 2007   Previously employed as a Production designer, theatre/television/film at [employer] -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   Social Drivers of Health   Financial Resource Strain: High Risk (03/26/2022)   Overall Financial Resource Strain (CARDIA)    Difficulty of Paying Living Expenses: Hard  Food Insecurity: High Risk (10/24/2022)   Received from [facility]   Hunger Vital Sign  Worried About Programme researcher, broadcasting/film/video in the Last Year: Often true    Ran Out of Food in the Last Year: Often true  Transportation Needs: Not on file (10/24/2022)  Physical Activity: Not on file  Stress: Not on file  Social Connections: Not on file  Intimate Partner Violence: Not At Risk (03/26/2022)   Humiliation, Afraid, Rape, and Kick questionnaire    Fear of Current or Ex-Partner: No    Emotionally Abused: No    Physically Abused: No    Sexually Abused: No    Review of Systems:  All other review of systems negative except as mentioned in the HPI.  Physical Exam: Vital signs BP (!) 147/98   Pulse 71   Temp 97.9 F (36.6 C) (Temporal)   Resp 11   Ht 6\' 1"  (1.854 m)   Wt 216 lb (98 kg)   SpO2 100%   BMI 28.50 kg/m   General:   Alert,  Well-developed, well-nourished, pleasant and cooperative in NAD Airway:  Mallampati 1 Lungs:  Clear throughout to auscultation.   Heart:  Regular rate and rhythm; no murmurs, clicks, rubs,  or gallops. Abdomen:  Soft, nontender and nondistended. Normal bowel sounds.   Neuro/Psych:  Normal mood and affect. A and O x 3  Maren Beach, MD [facility]

## 2023-04-11 ENCOUNTER — Telehealth: Payer: Self-pay

## 2023-04-11 NOTE — Telephone Encounter (Signed)
No answer, left message to call if having any issues or concerns, B.Lj Miyamoto RN 

## 2023-04-12 ENCOUNTER — Inpatient Hospital Stay: Payer: Medicare Other | Attending: Oncology

## 2023-04-12 ENCOUNTER — Inpatient Hospital Stay (HOSPITAL_BASED_OUTPATIENT_CLINIC_OR_DEPARTMENT_OTHER): Payer: Medicare Other | Admitting: Oncology

## 2023-04-12 ENCOUNTER — Other Ambulatory Visit: Payer: Self-pay | Admitting: Nurse Practitioner

## 2023-04-12 VITALS — BP 113/79 | HR 77 | Temp 98.1°F | Resp 18 | Ht 73.0 in | Wt 214.0 lb

## 2023-04-12 DIAGNOSIS — N19 Unspecified kidney failure: Secondary | ICD-10-CM | POA: Diagnosis not present

## 2023-04-12 DIAGNOSIS — C9000 Multiple myeloma not having achieved remission: Secondary | ICD-10-CM

## 2023-04-12 DIAGNOSIS — R0789 Other chest pain: Secondary | ICD-10-CM | POA: Insufficient documentation

## 2023-04-12 DIAGNOSIS — M549 Dorsalgia, unspecified: Secondary | ICD-10-CM | POA: Insufficient documentation

## 2023-04-12 DIAGNOSIS — Z86718 Personal history of other venous thrombosis and embolism: Secondary | ICD-10-CM | POA: Diagnosis not present

## 2023-04-12 DIAGNOSIS — Z7901 Long term (current) use of anticoagulants: Secondary | ICD-10-CM | POA: Diagnosis not present

## 2023-04-12 DIAGNOSIS — K0889 Other specified disorders of teeth and supporting structures: Secondary | ICD-10-CM | POA: Insufficient documentation

## 2023-04-12 DIAGNOSIS — R531 Weakness: Secondary | ICD-10-CM | POA: Insufficient documentation

## 2023-04-12 DIAGNOSIS — I10 Essential (primary) hypertension: Secondary | ICD-10-CM | POA: Insufficient documentation

## 2023-04-12 DIAGNOSIS — D630 Anemia in neoplastic disease: Secondary | ICD-10-CM | POA: Diagnosis not present

## 2023-04-12 LAB — CBC WITH DIFFERENTIAL (CANCER CENTER ONLY)
Abs Immature Granulocytes: 0.01 10*3/uL (ref 0.00–0.07)
Basophils Absolute: 0.1 10*3/uL (ref 0.0–0.1)
Basophils Relative: 4 %
Eosinophils Absolute: 0.1 10*3/uL (ref 0.0–0.5)
Eosinophils Relative: 3 %
HCT: 40.4 % (ref 39.0–52.0)
Hemoglobin: 13.8 g/dL (ref 13.0–17.0)
Immature Granulocytes: 0 %
Lymphocytes Relative: 37 %
Lymphs Abs: 1.4 10*3/uL (ref 0.7–4.0)
MCH: 26.5 pg (ref 26.0–34.0)
MCHC: 34.2 g/dL (ref 30.0–36.0)
MCV: 77.5 fL — ABNORMAL LOW (ref 80.0–100.0)
Monocytes Absolute: 0.4 10*3/uL (ref 0.1–1.0)
Monocytes Relative: 11 %
Neutro Abs: 1.8 10*3/uL (ref 1.7–7.7)
Neutrophils Relative %: 45 %
Platelet Count: 225 10*3/uL (ref 150–400)
RBC: 5.21 MIL/uL (ref 4.22–5.81)
RDW: 14.3 % (ref 11.5–15.5)
WBC Count: 3.9 10*3/uL — ABNORMAL LOW (ref 4.0–10.5)
nRBC: 0 % (ref 0.0–0.2)

## 2023-04-12 LAB — CMP (CANCER CENTER ONLY)
ALT: 25 U/L (ref 0–44)
AST: 19 U/L (ref 15–41)
Albumin: 4.4 g/dL (ref 3.5–5.0)
Alkaline Phosphatase: 57 U/L (ref 38–126)
Anion gap: 8 (ref 5–15)
BUN: 11 mg/dL (ref 6–20)
CO2: 26 mmol/L (ref 22–32)
Calcium: 9.3 mg/dL (ref 8.9–10.3)
Chloride: 106 mmol/L (ref 98–111)
Creatinine: 1.28 mg/dL — ABNORMAL HIGH (ref 0.61–1.24)
GFR, Estimated: >60 mL/min (ref >60)
Glucose, Bld: 103 mg/dL — ABNORMAL HIGH (ref 70–99)
Potassium: 3.9 mmol/L (ref 3.5–5.1)
Sodium: 140 mmol/L (ref 135–145)
Total Bilirubin: 0.4 mg/dL (ref 0.0–1.2)
Total Protein: 6.7 g/dL (ref 6.5–8.1)
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
## 2023-04-13 LAB — KAPPA/LAMBDA LIGHT CHAINS
Kappa free light chain: 27.7 mg/L — ABNORMAL HIGH (ref 3.3–19.4)
Kappa, lambda light chain ratio: 2.56 — ABNORMAL HIGH (ref 0.26–1.65)
Lambda free light chains: 10.8 mg/L (ref 5.7–26.3)

## 2023-04-13 LAB — SURGICAL PATHOLOGY

## 2023-04-14 ENCOUNTER — Encounter: Payer: Self-pay | Admitting: Pediatrics

## 2023-04-18 LAB — PROTEIN ELECTROPHORESIS, SERUM
A/G Ratio: 1.2 (ref 0.7–1.7)
Albumin ELP: 3.5 g/dL (ref 2.9–4.4)
Alpha-1-Globulin: 0.2 g/dL (ref 0.0–0.4)
Alpha-2-Globulin: 0.8 g/dL (ref 0.4–1.0)
Beta Globulin: 1.2 g/dL (ref 0.7–1.3)
Gamma Globulin: 0.8 g/dL (ref 0.4–1.8)
Globulin, Total: 3 g/dL (ref 2.2–3.9)
Total Protein ELP: 6.5 g/dL (ref 6.0–8.5)

## 2023-05-02 ENCOUNTER — Other Ambulatory Visit: Payer: Self-pay | Admitting: *Deleted

## 2023-05-02 DIAGNOSIS — C9000 Multiple myeloma not having achieved remission: Secondary | ICD-10-CM

## 2023-05-02 MED ORDER — LENALIDOMIDE 5 MG PO CAPS: 5.0000 mg | ORAL_CAPSULE | Freq: Every day | ORAL | 0 refills | Status: DC

## 2023-05-02 NOTE — Telephone Encounter (Signed)
 Call from [facility] requesting refill on Revlimid  w/next cycle due 05/10/23

## 2023-05-03 ENCOUNTER — Encounter: Payer: Self-pay | Admitting: Oncology

## 2023-05-10 ENCOUNTER — Inpatient Hospital Stay (HOSPITAL_BASED_OUTPATIENT_CLINIC_OR_DEPARTMENT_OTHER): Payer: 59 | Admitting: Nurse Practitioner

## 2023-05-10 ENCOUNTER — Inpatient Hospital Stay: Payer: 59 | Attending: Oncology

## 2023-05-10 ENCOUNTER — Inpatient Hospital Stay: Payer: 59

## 2023-05-10 ENCOUNTER — Encounter: Payer: Self-pay | Admitting: Nurse Practitioner

## 2023-05-10 VITALS — BP 123/90 | HR 76 | Temp 97.9°F | Resp 18 | Ht 73.0 in | Wt 214.8 lb

## 2023-05-10 DIAGNOSIS — N19 Unspecified kidney failure: Secondary | ICD-10-CM | POA: Insufficient documentation

## 2023-05-10 DIAGNOSIS — R0789 Other chest pain: Secondary | ICD-10-CM | POA: Insufficient documentation

## 2023-05-10 DIAGNOSIS — I10 Essential (primary) hypertension: Secondary | ICD-10-CM | POA: Diagnosis not present

## 2023-05-10 DIAGNOSIS — R531 Weakness: Secondary | ICD-10-CM | POA: Insufficient documentation

## 2023-05-10 DIAGNOSIS — R21 Rash and other nonspecific skin eruption: Secondary | ICD-10-CM | POA: Insufficient documentation

## 2023-05-10 DIAGNOSIS — C9000 Multiple myeloma not having achieved remission: Secondary | ICD-10-CM | POA: Insufficient documentation

## 2023-05-10 DIAGNOSIS — M549 Dorsalgia, unspecified: Secondary | ICD-10-CM | POA: Diagnosis not present

## 2023-05-10 LAB — CBC WITH DIFFERENTIAL (CANCER CENTER ONLY)
Abs Immature Granulocytes: 0.01 10*3/uL (ref 0.00–0.07)
Basophils Absolute: 0.1 10*3/uL (ref 0.0–0.1)
Basophils Relative: 3 %
Eosinophils Absolute: 0.1 10*3/uL (ref 0.0–0.5)
Eosinophils Relative: 2 %
HCT: 42.4 % (ref 39.0–52.0)
Hemoglobin: 14 g/dL (ref 13.0–17.0)
Immature Granulocytes: 0 %
Lymphocytes Relative: 39 %
Lymphs Abs: 1.8 10*3/uL (ref 0.7–4.0)
MCH: 26.3 pg (ref 26.0–34.0)
MCHC: 33 g/dL (ref 30.0–36.0)
MCV: 79.5 fL — ABNORMAL LOW (ref 80.0–100.0)
Monocytes Absolute: 0.6 10*3/uL (ref 0.1–1.0)
Monocytes Relative: 14 %
Neutro Abs: 1.9 10*3/uL (ref 1.7–7.7)
Neutrophils Relative %: 42 %
Platelet Count: 247 10*3/uL (ref 150–400)
RBC: 5.33 MIL/uL (ref 4.22–5.81)
RDW: 14.1 % (ref 11.5–15.5)
WBC Count: 4.5 10*3/uL (ref 4.0–10.5)
nRBC: 0 % (ref 0.0–0.2)

## 2023-05-10 LAB — CMP (CANCER CENTER ONLY)
ALT: 37 U/L (ref 0–44)
AST: 30 U/L (ref 15–41)
Albumin: 4.3 g/dL (ref 3.5–5.0)
Alkaline Phosphatase: 69 U/L (ref 38–126)
Anion gap: 7 (ref 5–15)
BUN: 10 mg/dL (ref 6–20)
CO2: 27 mmol/L (ref 22–32)
Calcium: 9.2 mg/dL (ref 8.9–10.3)
Chloride: 106 mmol/L (ref 98–111)
Creatinine: 1.35 mg/dL — ABNORMAL HIGH (ref 0.61–1.24)
GFR, Estimated: >60 mL/min (ref >60)
Glucose, Bld: 97 mg/dL (ref 70–99)
Potassium: 3.9 mmol/L (ref 3.5–5.1)
Sodium: 140 mmol/L (ref 135–145)
Total Bilirubin: 0.4 mg/dL (ref 0.0–1.2)
Total Protein: 7.5 g/dL (ref 6.5–8.1)

## 2023-05-10 MED ORDER — ZOLEDRONIC ACID 4 MG/100ML IV SOLN
4.0000 mg | Freq: Once | INTRAVENOUS | Status: AC
Start: 2023-05-10 — End: 2023-05-10
  Administered 2023-05-10: 4 mg via INTRAVENOUS
  Filled 2023-05-10: qty 100

## 2023-05-10 NOTE — Progress Notes (Signed)
Patient seen by Lonna Cobb NP today  Vitals are within treatment parameters:Yes   Labs are within treatment parameters: Yes creatinine 1.35  Treatment plan has been signed: Yes   Per physician team, Patient is ready for treatment and there are NO modifications to the treatment plan.
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
## 2023-05-29 NOTE — Progress Notes (Unsigned)
 8+  [facility] Return Visit   Referring Provider Verlee Rossetti, PA-C [id number] [place],  [place] [address]  Primary Care Provider Verlee Rossetti, PA-C  Patient Profile: [name] is a 48 y.o. male with a history of multiple myeloma status post autologous stem cell transplant for 2021, AKI/ARF, hypertension, LLE DVT 2014 and superficial thrombosis left greater saphenous vein 2020 (no longer on anticoagulation) who returns to the gastroenterology office for follow-up of of the problem(s) noted below.  Problem List: Dysphagia/? 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 was last seen in the office 03/28/2023  Current GI Meds  None  Interval History   GERD/dysphagia -- Since stem cell transplant 2021 describes vomiting/regurgitation only of carbonated beverages -can no longer drink soda/Sprite -- Also notes sensation of potassium pills getting stuck in his throat/esophagus -- Otherwise denies dysphagia to other solids or liquids  -- EGD: Normal esophagus, stomach and duodenum; mildly tight cricopharyngeus Savary dilated to 16 mm   Has a history of previous GERD symptoms (noted bile in throat) treated with OTC Pepcid and spontaneously resolved Not currently endorsing GERD symptoms, regurgitation, pyrosis States that he has difficulty belching and needs to move his head a certain way in order to expel gas Remote history of thrush but no Candida esophagitis No previous EGD No family history of esophageal cancer  Chronic diarrhea -- Endorses loose watery stool since initiation of Revlimid for multiple myeloma -- No blood or mucus in stool -- Colonoscopy 03/2023 normal -no microscopic colitis on biopsies  Colon cancer screening -- Colonocopy 03/2023 - no polyps - repeat 2035 -- No family history of colorectal cancer or polyps   Denies changes in bowel habits other than  medication associated diarrhea as noted above Notes he has some concern regarding anesthesia for the procedure -indicates he has been awake after receiving sedation for bone biopsies and procedures related to myeloma -red GERD suggest received fentanyl and Versed Has a previous history of DVT and no longer on anticoagulation  Last colonoscopy: 03/2013 - normal colon and TI, + IH; normal biopsies Last endoscopy: 03/2023 - Normal esophagus, stomach and duodenum; mildly tight cricopharyngeus Savary dilated to 16 mm  Last Abd CT/CTE/MRE: Whole body PET 12/2021 -no pertinent findings in GI tract  GI Review of Symptoms Significant for GERD, dysphagia and diarrhea. Otherwise negative.  General Review of Systems  Review of systems is significant for the pertinent positives and negatives as listed per the HPI.  Full ROS is otherwise negative.  Past Medical History   Past Medical History:  Diagnosis Date   Acute deep vein thrombosis (DVT) of left lower extremity (HCC)    Depression    Hypertension    Multiple myeloma (HCC)       Past Surgical History   Past Surgical History:  Procedure Laterality Date   BONE BIOPSY     Wisdom Tooth removal        Allergies and Medications   Allergies  Allergen Reactions   Pork-Derived Products Nausea And Vomiting   Aspirin Hives and Anxiety     Current Outpatient Medications:    amLODipine (NORVASC) 5 MG tablet, TAKE 1 TABLET(5 MG) BY MOUTH DAILY, Disp: 30 tablet, Rfl: 5   Biotin 1 MG CAPS, Take by mouth., Disp: , Rfl:    cyanocobalamin 2000 MCG tablet, Take 2,000 mcg by mouth daily., Disp: , Rfl:  cyclobenzaprine (FLEXERIL) 5 MG tablet, TAKE 1 TABLET TWICE DAILY AS NEEDED FOR MUSCLE SPASMS, Disp: 30 tablet, Rfl: 0   diphenoxylate-atropine (LOMOTIL) 2.5-0.025 MG tablet, TAKE 1 TABLET BY MOUTH AFER EACH LOOSE STOOL UP TO 6 TIMES PER DAY, Disp: 60 tablet, Rfl: 4   HYDROmorphone (DILAUDID) 2 MG tablet, Take 1 tablet (2 mg total) by mouth every 6  (six) hours as needed for severe pain., Disp: 60 tablet, Rfl: 0   labetalol (NORMODYNE) 200 MG tablet, Take by mouth., Disp: , Rfl:    lenalidomide (REVLIMID) 5 MG capsule, Take 1 capsule (5 mg total) by mouth daily. Take one by mouth daily x 21 days on and 7 day rest. Repeat every 28 days. Start cycle on 05/10/2023, Disp: 21 capsule, Rfl: 0   magnesium oxide (MAG-OX) 400 (240 Mg) MG tablet, Take 200 mg by mouth daily., Disp: , Rfl:    Multiple Vitamin (MULTIVITAMIN) tablet, Take 1 tablet by mouth daily., Disp: , Rfl:    potassium chloride (KLOR-CON M) 10 MEQ tablet, Take 1 tablet (10 mEq total) by mouth 2 (two) times daily., Disp: 60 tablet, Rfl: 3   tadalafil (CIALIS) 20 MG tablet, Take 20 mg by mouth daily as needed., Disp: , Rfl:    triamcinolone ointment (KENALOG) 0.1 %, Apply topically. (Patient not taking: Reported on 04/08/2023), Disp: , Rfl:    Vitamin D, Ergocalciferol, (DRISDOL) 1.25 MG (50000 UNIT) CAPS capsule, Take 1 capsule (50,000 Units total) by mouth every 7 (seven) days., Disp: 4 capsule, Rfl: 3   Family History   Family History  Problem Relation Age of Onset   Heart disease Maternal Uncle    Diabetes Maternal Grandmother    Colon cancer Neg Hx    Colon polyps Neg Hx    Esophageal cancer Neg Hx    Rectal cancer Neg Hx    Stomach cancer Neg Hx     No colorectal cancer or polyps  Social History   Social History   Social History Narrative   Single, 2 children   Previous cigarette smoker -consumes 1 pack of cigarettes per week and quit in 2007   No current alcohol use -ceased consuming alcohol in 2007   Previously employed as a Production designer, theatre/television/film at [employer] -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   [name] reports that he quit smoking about 18 years ago. His smoking use included cigarettes. He has never used smokeless tobacco. He reports that he does not drink alcohol and does not use drugs.  Vital Signs and Physical Examination   There were no vitals filed  for this visit.  There is no height or weight on file to calculate BMI.    Constitutional: well-nourished appearing nontoxic appearing 48 y.o. male in no apparent distress.   Eyes: Anicteric sclerae Head: Normocephalic/atraumatic Neck: No masses Pulmonary: Clear to auscultation bilaterally CV: S1, S2, regular rate and rhythm GI/Abdomen: Soft, tender to palpation in the right upper quadrant adjacent to the costal margin by the rib (patient states this is chronic pain related to myeloma), no distention Anorectal: Deferred Neuro: Grossly normal Skin: Normal Bilateral Extremities: No edema   Review of Data  The following data was reviewed at the time of this encounter:  Laboratory Studies   Lab Results  Component Value Date   WBC 4.5 05/10/2023   HGB 14.0 05/10/2023   HCT 42.4 05/10/2023   MCV 79.5 (L) 05/10/2023   PLT 247 05/10/2023     Chemistry  Component Value Date/Time   NA 140 05/10/2023 0855   K 3.9 05/10/2023 0855   CL 106 05/10/2023 0855   CO2 27 05/10/2023 0855   BUN 10 05/10/2023 0855   CREATININE 1.35 (H) 05/10/2023 0855      Component Value Date/Time   CALCIUM 9.2 05/10/2023 0855   CALCIUM 13.0 (H) 11/21/2018 0923   ALKPHOS 69 05/10/2023 0855   AST 30 05/10/2023 0855   ALT 37 05/10/2023 0855   BILITOT 0.4 05/10/2023 0855      Lab Results  Component Value Date   TSH 1.670 10/19/2021     Imaging Studies  Whole-body PET 12/2021 1. No evidence for hypermetabolic myeloma on today's study. Specifically, the expansile right third rib lesion shows no hypermetabolism with uptake in this region at background marrow activity. No hypermetabolic lesion in the right chest wall. 2. Symmetric uptake identified in the region of the mandibular angle bilaterally without underlying bony or soft tissue lesion at this location. Apparent small level II lymph nodes bilaterally show some mild hypermetabolism, right greater than left. No lymphadenopathy by CT  size criteria. Potentially reactive, follow-up may be warranted. Neck CT with contrast could be used to better evaluate lymph node size as clinically warranted.    GI Procedures and Studies  EGD/Colonoscopy 03/2023 EGD - Normal esophagus, stomach and duodenum; mildly tight cricopharyngeus Savary dilated to 16 mm Colonoscopy - normal colon and TI, + IH; normal biopsies  Clinical Impression  It is my clinical impression that [name] is a 48 y.o. male with a history of multiple myeloma status post autologous stem cell transplant for 2021, AKI/ARF, hypertension, LLE DVT 2014 and superficial thrombosis left greater saphenous vein 2020 (no longer on anticoagulation) who presents for evaluation and management of:  Dysphagia/? GERD Chronic diarrhea secondary to Revlimid Colon cancer screening  [name] describes regurgitation of carbonated beverages and pill dysphagia since his stem cell transplant 2021.  Denies any difficulty swallowing other liquids or food.  Has a remote history of GERD type symptoms in the past but not currently endorsing any active GERD.  He has had thrush in the past but no Candida esophagitis as a complication of his stem cell transplant.  Given that symptoms are specifically related to carbonated beverages and medication effect stricture seems less likely.  Esophageal dysmotility may be a consideration.  At today's visit we discussed performing a diagnostic upper endoscopy for further evaluation of his symptoms.  In the absence of typical GERD symptoms have deferred trialing antacid therapy.  He reports chronic diarrhea with a temporal onset related to Revlimid and states this is a common side effect.  He will be undergoing screening colonoscopy and at the time of colonoscopy would recommend terminal ileal intubation and performing random colon biopsies to rule out microscopic colitis.  He is amenable to colonoscopy for colon cancer screening.  He did express concern regarding  being sedated enough for the procedure.  Advised that he would receive monitored anesthesia care with propofol.  He reports that he has been awake during previous bone biopsies -chart review suggest that these were primarily performed under moderate sedation with Versed and fentanyl.  Plan  Schedule diagnostic EGD for further evaluation of dysphagia; consider gastric and duodenal biopsies given history of right upper quadrant abdominal pain and diarrhea Schedule screening colonoscopy as patient is now due.  He has incidental symptoms of chronic diarrhea related to Revlimid -advised TI intubation and random colon biopsies to rule out microscopic colitis Further  recommendations pending results of EGD and colonoscopy.  Planned Follow Up 2-3 months  The patient or caregiver verbalized understanding of the material covered, with no barriers to understanding. All questions were answered. Patient or caregiver is agreeable with the plan outlined above.    It was a pleasure to see [name].  If you have any questions or concerns regarding this evaluation, do not hesitate to contact me.   Derek Beach, MD [facility]  I spent total of 45 minutes in both face-to-face and non-face-to-face activities, excluding procedures performed, for the visit on the date of this encounter.

## 2023-05-30 ENCOUNTER — Encounter: Payer: Self-pay | Admitting: Pediatrics

## 2023-05-30 ENCOUNTER — Ambulatory Visit (INDEPENDENT_AMBULATORY_CARE_PROVIDER_SITE_OTHER): Payer: Medicare Other | Admitting: Pediatrics

## 2023-05-30 VITALS — BP 114/64 | HR 76 | Ht 71.0 in | Wt 214.0 lb

## 2023-05-30 DIAGNOSIS — K529 Noninfective gastroenteritis and colitis, unspecified: Secondary | ICD-10-CM

## 2023-05-30 DIAGNOSIS — R1310 Dysphagia, unspecified: Secondary | ICD-10-CM | POA: Diagnosis not present

## 2023-05-30 DIAGNOSIS — R197 Diarrhea, unspecified: Secondary | ICD-10-CM

## 2023-05-30 NOTE — Patient Instructions (Signed)
 _______________________________________________________  If your blood pressure at your visit was 140/90 or greater, please contact your primary care physician to follow up on this.  _______________________________________________________  If you are age 50 or older, your body mass index should be between 23-30. Your Body mass index is 29.85 kg/m. If this is out of the aforementioned range listed, please consider follow up with your Primary Care Provider.  If you are age 58 or younger, your body mass index should be between 19-25. Your Body mass index is 29.85 kg/m. If this is out of the aformentioned range listed, please consider follow up with your Primary Care Provider.   ________________________________________________________  The [facility] providers would like to encourage you to use [facility] to communicate with providers for non-urgent requests or questions.  Due to long hold times on the telephone, sending your provider a message by [facility] may be a faster and more efficient way to get a response.  Please allow 48 business hours for a response.  Please remember that this is for non-urgent requests.  _______________________________________________________  I appreciate the opportunity to care for you. Maren Beach, MD

## 2023-05-31 ENCOUNTER — Other Ambulatory Visit: Payer: Self-pay | Admitting: *Deleted

## 2023-05-31 DIAGNOSIS — C9000 Multiple myeloma not having achieved remission: Secondary | ICD-10-CM

## 2023-05-31 MED ORDER — LENALIDOMIDE 5 MG PO CAPS: 5.0000 mg | ORAL_CAPSULE | Freq: Every day | ORAL | 0 refills | Status: DC

## 2023-05-31 NOTE — Telephone Encounter (Signed)
 Received faxed refill request from [facility] for Revlimid

## 2023-06-02 ENCOUNTER — Inpatient Hospital Stay: Payer: 59 | Attending: Oncology

## 2023-06-02 ENCOUNTER — Inpatient Hospital Stay (HOSPITAL_BASED_OUTPATIENT_CLINIC_OR_DEPARTMENT_OTHER): Payer: 59 | Admitting: Oncology

## 2023-06-02 ENCOUNTER — Telehealth: Payer: Self-pay | Admitting: Oncology

## 2023-06-02 VITALS — BP 109/81 | HR 78 | Temp 98.1°F | Resp 18 | Ht 71.0 in | Wt 215.7 lb

## 2023-06-02 DIAGNOSIS — R0789 Other chest pain: Secondary | ICD-10-CM | POA: Insufficient documentation

## 2023-06-02 DIAGNOSIS — M549 Dorsalgia, unspecified: Secondary | ICD-10-CM | POA: Diagnosis not present

## 2023-06-02 DIAGNOSIS — I10 Essential (primary) hypertension: Secondary | ICD-10-CM | POA: Insufficient documentation

## 2023-06-02 DIAGNOSIS — R531 Weakness: Secondary | ICD-10-CM | POA: Insufficient documentation

## 2023-06-02 DIAGNOSIS — C9000 Multiple myeloma not having achieved remission: Secondary | ICD-10-CM | POA: Diagnosis not present

## 2023-06-02 DIAGNOSIS — Z86718 Personal history of other venous thrombosis and embolism: Secondary | ICD-10-CM | POA: Insufficient documentation

## 2023-06-02 LAB — CMP (CANCER CENTER ONLY)
ALT: 23 U/L (ref 0–44)
AST: 21 U/L (ref 15–41)
Albumin: 4.3 g/dL (ref 3.5–5.0)
Alkaline Phosphatase: 52 U/L (ref 38–126)
Anion gap: 9 (ref 5–15)
BUN: 13 mg/dL (ref 6–20)
CO2: 24 mmol/L (ref 22–32)
Calcium: 8.7 mg/dL — ABNORMAL LOW (ref 8.9–10.3)
Chloride: 108 mmol/L (ref 98–111)
Creatinine: 1.22 mg/dL (ref 0.61–1.24)
GFR, Estimated: >60 mL/min (ref >60)
Glucose, Bld: 108 mg/dL — ABNORMAL HIGH (ref 70–99)
Potassium: 3.7 mmol/L (ref 3.5–5.1)
Sodium: 141 mmol/L (ref 135–145)
Total Bilirubin: 0.6 mg/dL (ref 0.0–1.2)
Total Protein: 6.6 g/dL (ref 6.5–8.1)

## 2023-06-02 LAB — CBC WITH DIFFERENTIAL (CANCER CENTER ONLY)
Abs Immature Granulocytes: 0 10*3/uL (ref 0.00–0.07)
Basophils Absolute: 0.1 10*3/uL (ref 0.0–0.1)
Basophils Relative: 2 %
Eosinophils Absolute: 0.1 10*3/uL (ref 0.0–0.5)
Eosinophils Relative: 4 %
HCT: 39 % (ref 39.0–52.0)
Hemoglobin: 12.9 g/dL — ABNORMAL LOW (ref 13.0–17.0)
Immature Granulocytes: 0 %
Lymphocytes Relative: 39 %
Lymphs Abs: 1.5 10*3/uL (ref 0.7–4.0)
MCH: 25.7 pg — ABNORMAL LOW (ref 26.0–34.0)
MCHC: 33.1 g/dL (ref 30.0–36.0)
MCV: 77.7 fL — ABNORMAL LOW (ref 80.0–100.0)
Monocytes Absolute: 0.4 10*3/uL (ref 0.1–1.0)
Monocytes Relative: 11 %
Neutro Abs: 1.7 10*3/uL (ref 1.7–7.7)
Neutrophils Relative %: 44 %
Platelet Count: 170 10*3/uL (ref 150–400)
RBC: 5.02 MIL/uL (ref 4.22–5.81)
RDW: 14.3 % (ref 11.5–15.5)
WBC Count: 3.8 10*3/uL — ABNORMAL LOW (ref 4.0–10.5)
nRBC: 0 % (ref 0.0–0.2)

## 2023-06-02 NOTE — Telephone Encounter (Signed)
 Spoke with patient confirming upcoming appointment
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
## 2023-06-03 LAB — KAPPA/LAMBDA LIGHT CHAINS
Kappa free light chain: 30.4 mg/L — ABNORMAL HIGH (ref 3.3–19.4)
Kappa, lambda light chain ratio: 2.11 — ABNORMAL HIGH (ref 0.26–1.65)
Lambda free light chains: 14.4 mg/L (ref 5.7–26.3)

## 2023-06-05 LAB — MULTIPLE MYELOMA PANEL, SERUM
Albumin SerPl Elph-Mcnc: 3.8 g/dL (ref 2.9–4.4)
Albumin/Glob SerPl: 1.5 (ref 0.7–1.7)
Alpha 1: 0.2 g/dL (ref 0.0–0.4)
Alpha2 Glob SerPl Elph-Mcnc: 0.7 g/dL (ref 0.4–1.0)
B-Globulin SerPl Elph-Mcnc: 1.1 g/dL (ref 0.7–1.3)
Gamma Glob SerPl Elph-Mcnc: 0.8 g/dL (ref 0.4–1.8)
Globulin, Total: 2.7 g/dL (ref 2.2–3.9)
IgA: 121 mg/dL (ref 90–386)
IgG (Immunoglobin G), Serum: 941 mg/dL (ref 603–1613)
IgM (Immunoglobulin M), Srm: 43 mg/dL (ref 20–172)
Total Protein ELP: 6.5 g/dL (ref 6.0–8.5)

## 2023-06-07 ENCOUNTER — Other Ambulatory Visit: Payer: Self-pay | Admitting: Oncology

## 2023-06-07 DIAGNOSIS — C9000 Multiple myeloma not having achieved remission: Secondary | ICD-10-CM

## 2023-06-24 ENCOUNTER — Other Ambulatory Visit: Payer: Self-pay | Admitting: Oncology

## 2023-06-24 DIAGNOSIS — C9000 Multiple myeloma not having achieved remission: Secondary | ICD-10-CM

## 2023-06-28 ENCOUNTER — Other Ambulatory Visit: Payer: Self-pay | Admitting: *Deleted

## 2023-06-28 DIAGNOSIS — C9000 Multiple myeloma not having achieved remission: Secondary | ICD-10-CM

## 2023-06-28 MED ORDER — LENALIDOMIDE 5 MG PO CAPS: 5.0000 mg | ORAL_CAPSULE | Freq: Every day | ORAL | 0 refills | Status: DC

## 2023-06-28 NOTE — Telephone Encounter (Signed)
Received refill request for Lenalidomide

## 2023-07-01 ENCOUNTER — Ambulatory Visit: Admitting: Oncology

## 2023-07-01 ENCOUNTER — Inpatient Hospital Stay: Attending: Oncology

## 2023-07-01 ENCOUNTER — Other Ambulatory Visit

## 2023-07-01 ENCOUNTER — Telehealth: Payer: Self-pay | Admitting: Oncology

## 2023-07-01 ENCOUNTER — Inpatient Hospital Stay (HOSPITAL_BASED_OUTPATIENT_CLINIC_OR_DEPARTMENT_OTHER): Admitting: Oncology

## 2023-07-01 DIAGNOSIS — C9000 Multiple myeloma not having achieved remission: Secondary | ICD-10-CM

## 2023-07-01 DIAGNOSIS — Z86718 Personal history of other venous thrombosis and embolism: Secondary | ICD-10-CM | POA: Diagnosis not present

## 2023-07-01 DIAGNOSIS — D630 Anemia in neoplastic disease: Secondary | ICD-10-CM | POA: Insufficient documentation

## 2023-07-01 DIAGNOSIS — R531 Weakness: Secondary | ICD-10-CM | POA: Diagnosis not present

## 2023-07-01 LAB — CBC WITH DIFFERENTIAL (CANCER CENTER ONLY)
Abs Immature Granulocytes: 0.01 10*3/uL (ref 0.00–0.07)
Basophils Absolute: 0.1 10*3/uL (ref 0.0–0.1)
Basophils Relative: 2 %
Eosinophils Absolute: 0.1 10*3/uL (ref 0.0–0.5)
Eosinophils Relative: 3 %
HCT: 41.8 % (ref 39.0–52.0)
Hemoglobin: 13.9 g/dL (ref 13.0–17.0)
Immature Granulocytes: 0 %
Lymphocytes Relative: 44 %
Lymphs Abs: 1.6 10*3/uL (ref 0.7–4.0)
MCH: 26.2 pg (ref 26.0–34.0)
MCHC: 33.3 g/dL (ref 30.0–36.0)
MCV: 78.7 fL — ABNORMAL LOW (ref 80.0–100.0)
Monocytes Absolute: 0.4 10*3/uL (ref 0.1–1.0)
Monocytes Relative: 11 %
Neutro Abs: 1.5 10*3/uL — ABNORMAL LOW (ref 1.7–7.7)
Neutrophils Relative %: 40 %
Platelet Count: 173 10*3/uL (ref 150–400)
RBC: 5.31 MIL/uL (ref 4.22–5.81)
RDW: 14.2 % (ref 11.5–15.5)
WBC Count: 3.7 10*3/uL — ABNORMAL LOW (ref 4.0–10.5)
nRBC: 0 % (ref 0.0–0.2)

## 2023-07-01 LAB — CMP (CANCER CENTER ONLY)
ALT: 41 U/L (ref 0–44)
AST: 31 U/L (ref 15–41)
Albumin: 4.3 g/dL (ref 3.5–5.0)
Alkaline Phosphatase: 59 U/L (ref 38–126)
Anion gap: 7 (ref 5–15)
BUN: 14 mg/dL (ref 6–20)
CO2: 26 mmol/L (ref 22–32)
Calcium: 8.8 mg/dL — ABNORMAL LOW (ref 8.9–10.3)
Chloride: 107 mmol/L (ref 98–111)
Creatinine: 1.34 mg/dL — ABNORMAL HIGH (ref 0.61–1.24)
GFR, Estimated: >60 mL/min (ref >60)
Glucose, Bld: 93 mg/dL (ref 70–99)
Potassium: 4.1 mmol/L (ref 3.5–5.1)
Sodium: 140 mmol/L (ref 135–145)
Total Bilirubin: 0.5 mg/dL (ref 0.0–1.2)
Total Protein: 7.1 g/dL (ref 6.5–8.1)

## 2023-07-01 MED ORDER — VITAMIN D (ERGOCALCIFEROL) 1.25 MG (50000 UNIT) PO CAPS: 50000.0000 [IU] | ORAL_CAPSULE | ORAL | 3 refills | Status: DC
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
## 2023-07-01 NOTE — Telephone Encounter (Signed)
 Contacted pt to schedule an appt per 07/01/23 LOS. Unable to reach via phone, voicemail was left.       Follow-Up Information  Follow-up disposition: Return for Lab, office.  Check out comments: Office and zometa 5/9

## 2023-07-05 NOTE — Telephone Encounter (Signed)
 Contacted pt to schedule an appt. Unable to reach via phone, voicemail was left.

## 2023-07-07 NOTE — Telephone Encounter (Addendum)
 Patient has been scheduled for follow-up visit per 07/01/23 LOS.  LVM notifying pt of appt details, provided my direct number to pt if appt changes need to be made.

## 2023-07-08 ENCOUNTER — Other Ambulatory Visit: Payer: Self-pay | Admitting: *Deleted

## 2023-07-08 DIAGNOSIS — C9000 Multiple myeloma not having achieved remission: Secondary | ICD-10-CM

## 2023-07-26 ENCOUNTER — Encounter: Payer: Self-pay | Admitting: Oncology

## 2023-07-27 ENCOUNTER — Other Ambulatory Visit: Payer: Self-pay | Admitting: Oncology

## 2023-07-27 DIAGNOSIS — C9000 Multiple myeloma not having achieved remission: Secondary | ICD-10-CM

## 2023-07-29 ENCOUNTER — Other Ambulatory Visit

## 2023-07-29 ENCOUNTER — Ambulatory Visit: Admitting: Oncology

## 2023-07-29 ENCOUNTER — Ambulatory Visit

## 2023-08-01 ENCOUNTER — Inpatient Hospital Stay

## 2023-08-01 ENCOUNTER — Inpatient Hospital Stay (HOSPITAL_BASED_OUTPATIENT_CLINIC_OR_DEPARTMENT_OTHER): Admitting: Oncology

## 2023-08-01 ENCOUNTER — Inpatient Hospital Stay: Attending: Oncology

## 2023-08-01 VITALS — BP 127/87 | HR 76 | Temp 98.2°F | Resp 18 | Ht 71.0 in | Wt 213.7 lb

## 2023-08-01 DIAGNOSIS — C9000 Multiple myeloma not having achieved remission: Secondary | ICD-10-CM

## 2023-08-01 DIAGNOSIS — C9001 Multiple myeloma in remission: Secondary | ICD-10-CM | POA: Insufficient documentation

## 2023-08-01 LAB — CBC WITH DIFFERENTIAL (CANCER CENTER ONLY)
Abs Immature Granulocytes: 0 10*3/uL (ref 0.00–0.07)
Basophils Absolute: 0.1 10*3/uL (ref 0.0–0.1)
Basophils Relative: 3 %
Eosinophils Absolute: 0.2 10*3/uL (ref 0.0–0.5)
Eosinophils Relative: 4 %
HCT: 41.1 % (ref 39.0–52.0)
Hemoglobin: 13.7 g/dL (ref 13.0–17.0)
Immature Granulocytes: 0 %
Lymphocytes Relative: 45 %
Lymphs Abs: 1.7 10*3/uL (ref 0.7–4.0)
MCH: 26.3 pg (ref 26.0–34.0)
MCHC: 33.3 g/dL (ref 30.0–36.0)
MCV: 78.9 fL — ABNORMAL LOW (ref 80.0–100.0)
Monocytes Absolute: 0.4 10*3/uL (ref 0.1–1.0)
Monocytes Relative: 11 %
Neutro Abs: 1.4 10*3/uL — ABNORMAL LOW (ref 1.7–7.7)
Neutrophils Relative %: 37 %
Platelet Count: 191 10*3/uL (ref 150–400)
RBC: 5.21 MIL/uL (ref 4.22–5.81)
RDW: 13.6 % (ref 11.5–15.5)
WBC Count: 3.8 10*3/uL — ABNORMAL LOW (ref 4.0–10.5)
nRBC: 0 % (ref 0.0–0.2)

## 2023-08-01 LAB — CMP (CANCER CENTER ONLY)
ALT: 36 U/L (ref 0–44)
AST: 25 U/L (ref 15–41)
Albumin: 4.2 g/dL (ref 3.5–5.0)
Alkaline Phosphatase: 67 U/L (ref 38–126)
Anion gap: 10 (ref 5–15)
BUN: 11 mg/dL (ref 6–20)
CO2: 24 mmol/L (ref 22–32)
Calcium: 9.3 mg/dL (ref 8.9–10.3)
Chloride: 107 mmol/L (ref 98–111)
Creatinine: 1.29 mg/dL — ABNORMAL HIGH (ref 0.61–1.24)
GFR, Estimated: >60 mL/min (ref >60)
Glucose, Bld: 94 mg/dL (ref 70–99)
Potassium: 4.1 mmol/L (ref 3.5–5.1)
Sodium: 141 mmol/L (ref 135–145)
Total Bilirubin: 0.2 mg/dL (ref 0.0–1.2)
Total Protein: 6.7 g/dL (ref 6.5–8.1)

## 2023-08-01 MED ORDER — ZOLEDRONIC ACID 4 MG/100ML IV SOLN
4.0000 mg | Freq: Once | INTRAVENOUS | Status: AC
Start: 2023-08-01 — End: 2023-08-01
  Administered 2023-08-01: 4 mg via INTRAVENOUS
  Filled 2023-08-01: qty 100

## 2023-08-01 MED ORDER — SODIUM CHLORIDE 0.9% FLUSH: 10.0000 mL | Freq: Two times a day (BID) | INTRAVENOUS | Status: DC

## 2023-08-01 MED ORDER — SODIUM CHLORIDE 0.9 % IV SOLN: INTRAVENOUS | Status: DC

## 2023-08-01 NOTE — Progress Notes (Signed)
 Patient seen by Dr. Coni Deep today  Vitals are within treatment parameters:Yes   Labs are within treatment parameters: Yes Neut 1.4 it's ok to proceed  Treatment plan has been signed: Yes   Per physician team, Patient is ready for treatment and there are NO modifications to the treatment plan.

## 2023-08-01 NOTE — Patient Instructions (Signed)
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
## 2023-08-09 ENCOUNTER — Telehealth: Payer: Self-pay | Admitting: Nurse Practitioner

## 2023-08-09 NOTE — Telephone Encounter (Signed)
 Patient has been scheduled for follow-up visit per 08/08/23 LOS.  LVM notifying pt of appt details, provided my direct number to pt if appt changes need to be made.

## 2023-08-20 ENCOUNTER — Other Ambulatory Visit: Payer: Self-pay | Admitting: Oncology

## 2023-08-20 DIAGNOSIS — C9000 Multiple myeloma not having achieved remission: Secondary | ICD-10-CM

## 2023-08-22 ENCOUNTER — Encounter: Payer: Self-pay | Admitting: Oncology

## 2023-08-23 ENCOUNTER — Other Ambulatory Visit: Payer: Self-pay | Admitting: Oncology

## 2023-08-23 DIAGNOSIS — C9000 Multiple myeloma not having achieved remission: Secondary | ICD-10-CM

## 2023-09-01 ENCOUNTER — Inpatient Hospital Stay: Admitting: Nurse Practitioner

## 2023-09-01 ENCOUNTER — Inpatient Hospital Stay: Attending: Oncology

## 2023-09-01 ENCOUNTER — Encounter: Payer: Self-pay | Admitting: Nurse Practitioner

## 2023-09-01 VITALS — BP 109/78 | HR 76 | Temp 98.1°F | Resp 18 | Ht 71.0 in | Wt 208.8 lb

## 2023-09-01 DIAGNOSIS — C9000 Multiple myeloma not having achieved remission: Secondary | ICD-10-CM

## 2023-09-01 DIAGNOSIS — Z86718 Personal history of other venous thrombosis and embolism: Secondary | ICD-10-CM | POA: Diagnosis not present

## 2023-09-01 DIAGNOSIS — C9001 Multiple myeloma in remission: Secondary | ICD-10-CM | POA: Diagnosis present

## 2023-09-01 DIAGNOSIS — R531 Weakness: Secondary | ICD-10-CM | POA: Insufficient documentation

## 2023-09-01 DIAGNOSIS — G893 Neoplasm related pain (acute) (chronic): Secondary | ICD-10-CM | POA: Diagnosis not present

## 2023-09-01 DIAGNOSIS — I10 Essential (primary) hypertension: Secondary | ICD-10-CM | POA: Insufficient documentation

## 2023-09-01 LAB — CBC WITH DIFFERENTIAL (CANCER CENTER ONLY)
Abs Immature Granulocytes: 0 10*3/uL (ref 0.00–0.07)
Basophils Absolute: 0.1 10*3/uL (ref 0.0–0.1)
Basophils Relative: 3 %
Eosinophils Absolute: 0.1 10*3/uL (ref 0.0–0.5)
Eosinophils Relative: 2 %
HCT: 41.7 % (ref 39.0–52.0)
Hemoglobin: 13.8 g/dL (ref 13.0–17.0)
Immature Granulocytes: 0 %
Lymphocytes Relative: 42 %
Lymphs Abs: 1.5 10*3/uL (ref 0.7–4.0)
MCH: 26.1 pg (ref 26.0–34.0)
MCHC: 33.1 g/dL (ref 30.0–36.0)
MCV: 79 fL — ABNORMAL LOW (ref 80.0–100.0)
Monocytes Absolute: 0.4 10*3/uL (ref 0.1–1.0)
Monocytes Relative: 11 %
Neutro Abs: 1.6 10*3/uL — ABNORMAL LOW (ref 1.7–7.7)
Neutrophils Relative %: 42 %
Platelet Count: 219 10*3/uL (ref 150–400)
RBC: 5.28 MIL/uL (ref 4.22–5.81)
RDW: 13.9 % (ref 11.5–15.5)
WBC Count: 3.7 10*3/uL — ABNORMAL LOW (ref 4.0–10.5)
nRBC: 0 % (ref 0.0–0.2)

## 2023-09-01 LAB — CMP (CANCER CENTER ONLY)
ALT: 24 U/L (ref 0–44)
AST: 22 U/L (ref 15–41)
Albumin: 4.3 g/dL (ref 3.5–5.0)
Alkaline Phosphatase: 61 U/L (ref 38–126)
Anion gap: 12 (ref 5–15)
BUN: 11 mg/dL (ref 6–20)
CO2: 21 mmol/L — ABNORMAL LOW (ref 22–32)
Calcium: 9.4 mg/dL (ref 8.9–10.3)
Chloride: 107 mmol/L (ref 98–111)
Creatinine: 1.25 mg/dL — ABNORMAL HIGH (ref 0.61–1.24)
GFR, Estimated: >60 mL/min (ref >60)
Glucose, Bld: 110 mg/dL — ABNORMAL HIGH (ref 70–99)
Potassium: 4.3 mmol/L (ref 3.5–5.1)
Sodium: 140 mmol/L (ref 135–145)
Total Bilirubin: 0.5 mg/dL (ref 0.0–1.2)
Total Protein: 6.9 g/dL (ref 6.5–8.1)
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
## 2023-09-19 ENCOUNTER — Other Ambulatory Visit: Payer: Self-pay | Admitting: Oncology

## 2023-09-19 DIAGNOSIS — C9000 Multiple myeloma not having achieved remission: Secondary | ICD-10-CM

## 2023-09-28 ENCOUNTER — Inpatient Hospital Stay: Attending: Oncology

## 2023-09-28 ENCOUNTER — Inpatient Hospital Stay (HOSPITAL_BASED_OUTPATIENT_CLINIC_OR_DEPARTMENT_OTHER): Admitting: Oncology

## 2023-09-28 VITALS — BP 115/84 | HR 75 | Temp 97.8°F | Resp 18 | Ht 71.0 in | Wt 208.3 lb

## 2023-09-28 DIAGNOSIS — M549 Dorsalgia, unspecified: Secondary | ICD-10-CM | POA: Diagnosis not present

## 2023-09-28 DIAGNOSIS — C9000 Multiple myeloma not having achieved remission: Secondary | ICD-10-CM

## 2023-09-28 DIAGNOSIS — R0789 Other chest pain: Secondary | ICD-10-CM | POA: Insufficient documentation

## 2023-09-28 DIAGNOSIS — Z86718 Personal history of other venous thrombosis and embolism: Secondary | ICD-10-CM | POA: Diagnosis not present

## 2023-09-28 DIAGNOSIS — R21 Rash and other nonspecific skin eruption: Secondary | ICD-10-CM | POA: Diagnosis not present

## 2023-09-28 DIAGNOSIS — I10 Essential (primary) hypertension: Secondary | ICD-10-CM | POA: Insufficient documentation

## 2023-09-28 DIAGNOSIS — N19 Unspecified kidney failure: Secondary | ICD-10-CM | POA: Diagnosis not present

## 2023-09-28 DIAGNOSIS — R531 Weakness: Secondary | ICD-10-CM | POA: Diagnosis not present

## 2023-09-28 DIAGNOSIS — Z9221 Personal history of antineoplastic chemotherapy: Secondary | ICD-10-CM | POA: Diagnosis not present

## 2023-09-28 LAB — CBC WITH DIFFERENTIAL (CANCER CENTER ONLY)
Abs Immature Granulocytes: 0.01 K/uL (ref 0.00–0.07)
Basophils Absolute: 0.1 K/uL (ref 0.0–0.1)
Basophils Relative: 4 %
Eosinophils Absolute: 0.1 K/uL (ref 0.0–0.5)
Eosinophils Relative: 3 %
HCT: 39.7 % (ref 39.0–52.0)
Hemoglobin: 13.3 g/dL (ref 13.0–17.0)
Immature Granulocytes: 0 %
Lymphocytes Relative: 45 %
Lymphs Abs: 1.5 K/uL (ref 0.7–4.0)
MCH: 25.9 pg — ABNORMAL LOW (ref 26.0–34.0)
MCHC: 33.5 g/dL (ref 30.0–36.0)
MCV: 77.2 fL — ABNORMAL LOW (ref 80.0–100.0)
Monocytes Absolute: 0.3 K/uL (ref 0.1–1.0)
Monocytes Relative: 10 %
Neutro Abs: 1.3 K/uL — ABNORMAL LOW (ref 1.7–7.7)
Neutrophils Relative %: 38 %
Platelet Count: 210 K/uL (ref 150–400)
RBC: 5.14 MIL/uL (ref 4.22–5.81)
RDW: 13.9 % (ref 11.5–15.5)
WBC Count: 3.3 K/uL — ABNORMAL LOW (ref 4.0–10.5)
nRBC: 0 % (ref 0.0–0.2)

## 2023-09-28 LAB — CMP (CANCER CENTER ONLY)
ALT: 29 U/L (ref 0–44)
AST: 24 U/L (ref 15–41)
Albumin: 4.2 g/dL (ref 3.5–5.0)
Alkaline Phosphatase: 65 U/L (ref 38–126)
Anion gap: 12 (ref 5–15)
BUN: 10 mg/dL (ref 6–20)
CO2: 21 mmol/L — ABNORMAL LOW (ref 22–32)
Calcium: 9 mg/dL (ref 8.9–10.3)
Chloride: 109 mmol/L (ref 98–111)
Creatinine: 1.19 mg/dL (ref 0.61–1.24)
GFR, Estimated: >60 mL/min (ref >60)
Glucose, Bld: 118 mg/dL — ABNORMAL HIGH (ref 70–99)
Potassium: 4.2 mmol/L (ref 3.5–5.1)
Sodium: 141 mmol/L (ref 135–145)
Total Bilirubin: 0.4 mg/dL (ref 0.0–1.2)
Total Protein: 6.8 g/dL (ref 6.5–8.1)
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
## 2023-09-29 LAB — KAPPA/LAMBDA LIGHT CHAINS
Kappa free light chain: 30.4 mg/L — ABNORMAL HIGH (ref 3.3–19.4)
Kappa, lambda light chain ratio: 2.49 — ABNORMAL HIGH (ref 0.26–1.65)
Lambda free light chains: 12.2 mg/L (ref 5.7–26.3)

## 2023-10-03 ENCOUNTER — Other Ambulatory Visit: Payer: Self-pay | Admitting: Oncology

## 2023-10-03 DIAGNOSIS — C9000 Multiple myeloma not having achieved remission: Secondary | ICD-10-CM

## 2023-10-03 LAB — MULTIPLE MYELOMA PANEL, SERUM
Albumin SerPl Elph-Mcnc: 3.9 g/dL (ref 2.9–4.4)
Albumin/Glob SerPl: 1.6 (ref 0.7–1.7)
Alpha 1: 0.1 g/dL (ref 0.0–0.4)
Alpha2 Glob SerPl Elph-Mcnc: 0.6 g/dL (ref 0.4–1.0)
B-Globulin SerPl Elph-Mcnc: 1 g/dL (ref 0.7–1.3)
Gamma Glob SerPl Elph-Mcnc: 0.7 g/dL (ref 0.4–1.8)
Globulin, Total: 2.5 g/dL (ref 2.2–3.9)
IgA: 121 mg/dL (ref 90–386)
IgG (Immunoglobin G), Serum: 899 mg/dL (ref 603–1613)
IgM (Immunoglobulin M), Srm: 48 mg/dL (ref 20–172)
Total Protein ELP: 6.4 g/dL (ref 6.0–8.5)

## 2023-10-07 ENCOUNTER — Other Ambulatory Visit: Payer: Self-pay | Admitting: Oncology

## 2023-10-07 DIAGNOSIS — C9000 Multiple myeloma not having achieved remission: Secondary | ICD-10-CM

## 2023-10-18 ENCOUNTER — Other Ambulatory Visit: Payer: Self-pay | Admitting: Oncology

## 2023-10-18 DIAGNOSIS — C9000 Multiple myeloma not having achieved remission: Secondary | ICD-10-CM

## 2023-10-26 ENCOUNTER — Encounter: Payer: Self-pay | Admitting: Nurse Practitioner

## 2023-10-26 ENCOUNTER — Inpatient Hospital Stay

## 2023-10-26 ENCOUNTER — Inpatient Hospital Stay (HOSPITAL_BASED_OUTPATIENT_CLINIC_OR_DEPARTMENT_OTHER): Admitting: Nurse Practitioner

## 2023-10-26 ENCOUNTER — Inpatient Hospital Stay: Attending: Oncology

## 2023-10-26 VITALS — BP 117/78 | HR 75 | Temp 97.9°F | Resp 18

## 2023-10-26 VITALS — BP 113/87 | HR 70 | Temp 97.8°F | Resp 18 | Ht 71.0 in | Wt 207.3 lb

## 2023-10-26 DIAGNOSIS — R531 Weakness: Secondary | ICD-10-CM | POA: Insufficient documentation

## 2023-10-26 DIAGNOSIS — C9000 Multiple myeloma not having achieved remission: Secondary | ICD-10-CM

## 2023-10-26 DIAGNOSIS — I10 Essential (primary) hypertension: Secondary | ICD-10-CM | POA: Insufficient documentation

## 2023-10-26 DIAGNOSIS — Z86718 Personal history of other venous thrombosis and embolism: Secondary | ICD-10-CM | POA: Diagnosis not present

## 2023-10-26 DIAGNOSIS — G893 Neoplasm related pain (acute) (chronic): Secondary | ICD-10-CM | POA: Diagnosis not present

## 2023-10-26 DIAGNOSIS — D709 Neutropenia, unspecified: Secondary | ICD-10-CM | POA: Insufficient documentation

## 2023-10-26 DIAGNOSIS — G629 Polyneuropathy, unspecified: Secondary | ICD-10-CM | POA: Insufficient documentation

## 2023-10-26 LAB — CBC WITH DIFFERENTIAL (CANCER CENTER ONLY)
Abs Immature Granulocytes: 0 K/uL (ref 0.00–0.07)
Basophils Absolute: 0.1 K/uL (ref 0.0–0.1)
Basophils Relative: 3 %
Eosinophils Absolute: 0.1 K/uL (ref 0.0–0.5)
Eosinophils Relative: 2 %
HCT: 40.4 % (ref 39.0–52.0)
Hemoglobin: 13.4 g/dL (ref 13.0–17.0)
Immature Granulocytes: 0 %
Lymphocytes Relative: 42 %
Lymphs Abs: 1.4 K/uL (ref 0.7–4.0)
MCH: 26.6 pg (ref 26.0–34.0)
MCHC: 33.2 g/dL (ref 30.0–36.0)
MCV: 80.2 fL (ref 80.0–100.0)
Monocytes Absolute: 0.4 K/uL (ref 0.1–1.0)
Monocytes Relative: 11 %
Neutro Abs: 1.4 K/uL — ABNORMAL LOW (ref 1.7–7.7)
Neutrophils Relative %: 42 %
Platelet Count: 197 K/uL (ref 150–400)
RBC: 5.04 MIL/uL (ref 4.22–5.81)
RDW: 14.3 % (ref 11.5–15.5)
WBC Count: 3.3 K/uL — ABNORMAL LOW (ref 4.0–10.5)
nRBC: 0 % (ref 0.0–0.2)

## 2023-10-26 LAB — CMP (CANCER CENTER ONLY)
ALT: 18 U/L (ref 0–44)
AST: 20 U/L (ref 15–41)
Albumin: 4.5 g/dL (ref 3.5–5.0)
Alkaline Phosphatase: 58 U/L (ref 38–126)
Anion gap: 11 (ref 5–15)
BUN: 11 mg/dL (ref 6–20)
CO2: 23 mmol/L (ref 22–32)
Calcium: 9.5 mg/dL (ref 8.9–10.3)
Chloride: 108 mmol/L (ref 98–111)
Creatinine: 1.29 mg/dL — ABNORMAL HIGH (ref 0.61–1.24)
GFR, Estimated: >60 mL/min (ref >60)
Glucose, Bld: 75 mg/dL (ref 70–99)
Potassium: 4.3 mmol/L (ref 3.5–5.1)
Sodium: 142 mmol/L (ref 135–145)
Total Bilirubin: 0.6 mg/dL (ref 0.0–1.2)
Total Protein: 7 g/dL (ref 6.5–8.1)

## 2023-10-26 MED ORDER — SODIUM CHLORIDE 0.9% FLUSH: 10.0000 mL | Freq: Two times a day (BID) | INTRAVENOUS | Status: DC

## 2023-10-26 MED ORDER — ZOLEDRONIC ACID 4 MG/100ML IV SOLN
4.0000 mg | Freq: Once | INTRAVENOUS | Status: AC
  Administered 2023-10-26: 4 mg via INTRAVENOUS
  Filled 2023-10-26: qty 100
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
## 2023-10-26 NOTE — Progress Notes (Signed)
 Patient seen by Olam Ned NP today  Vitals are within treatment parameters:Yes   Labs are within treatment parameters: Yes  Creatinine- 1.29, Neutro ANC- 1.4 Treatment plan has been signed: Yes   Per physician team, Patient is ready for treatment and there are NO modifications to the treatment plan.

## 2023-10-26 NOTE — Patient Instructions (Signed)

## 2023-10-29 ENCOUNTER — Other Ambulatory Visit: Payer: Self-pay | Admitting: Oncology

## 2023-10-29 DIAGNOSIS — C9000 Multiple myeloma not having achieved remission: Secondary | ICD-10-CM

## 2023-10-31 ENCOUNTER — Encounter: Payer: Self-pay | Admitting: Oncology

## 2023-11-14 ENCOUNTER — Other Ambulatory Visit: Payer: Self-pay | Admitting: Oncology

## 2023-11-14 DIAGNOSIS — C9000 Multiple myeloma not having achieved remission: Secondary | ICD-10-CM

## 2023-11-15 ENCOUNTER — Other Ambulatory Visit: Payer: Self-pay | Admitting: *Deleted

## 2023-11-15 DIAGNOSIS — C9000 Multiple myeloma not having achieved remission: Secondary | ICD-10-CM

## 2023-11-15 MED ORDER — LENALIDOMIDE 5 MG PO CAPS: 5.0000 mg | ORAL_CAPSULE | Freq: Every day | ORAL | 0 refills | Status: DC

## 2023-11-24 ENCOUNTER — Telehealth: Payer: Self-pay | Admitting: *Deleted

## 2023-11-24 ENCOUNTER — Inpatient Hospital Stay (HOSPITAL_BASED_OUTPATIENT_CLINIC_OR_DEPARTMENT_OTHER): Admitting: Oncology

## 2023-11-24 ENCOUNTER — Inpatient Hospital Stay: Attending: Oncology

## 2023-11-24 VITALS — BP 108/83 | HR 76 | Temp 97.8°F | Resp 18 | Ht 71.0 in | Wt 205.1 lb

## 2023-11-24 DIAGNOSIS — N19 Unspecified kidney failure: Secondary | ICD-10-CM | POA: Insufficient documentation

## 2023-11-24 DIAGNOSIS — C9000 Multiple myeloma not having achieved remission: Secondary | ICD-10-CM | POA: Insufficient documentation

## 2023-11-24 DIAGNOSIS — Z9221 Personal history of antineoplastic chemotherapy: Secondary | ICD-10-CM | POA: Diagnosis not present

## 2023-11-24 DIAGNOSIS — D709 Neutropenia, unspecified: Secondary | ICD-10-CM | POA: Diagnosis not present

## 2023-11-24 DIAGNOSIS — R110 Nausea: Secondary | ICD-10-CM | POA: Diagnosis not present

## 2023-11-24 DIAGNOSIS — G893 Neoplasm related pain (acute) (chronic): Secondary | ICD-10-CM | POA: Insufficient documentation

## 2023-11-24 DIAGNOSIS — I10 Essential (primary) hypertension: Secondary | ICD-10-CM | POA: Diagnosis not present

## 2023-11-24 DIAGNOSIS — R531 Weakness: Secondary | ICD-10-CM | POA: Diagnosis not present

## 2023-11-24 DIAGNOSIS — Z86718 Personal history of other venous thrombosis and embolism: Secondary | ICD-10-CM | POA: Diagnosis not present

## 2023-11-24 LAB — CMP (CANCER CENTER ONLY)
ALT: 37 U/L (ref 0–44)
AST: 32 U/L (ref 15–41)
Albumin: 4.3 g/dL (ref 3.5–5.0)
Alkaline Phosphatase: 66 U/L (ref 38–126)
Anion gap: 11 (ref 5–15)
BUN: 8 mg/dL (ref 6–20)
CO2: 20 mmol/L — ABNORMAL LOW (ref 22–32)
Calcium: 9.2 mg/dL (ref 8.9–10.3)
Chloride: 109 mmol/L (ref 98–111)
Creatinine: 1.15 mg/dL (ref 0.61–1.24)
GFR, Estimated: >60 mL/min (ref >60)
Glucose, Bld: 89 mg/dL (ref 70–99)
Potassium: 4.3 mmol/L (ref 3.5–5.1)
Sodium: 140 mmol/L (ref 135–145)
Total Bilirubin: 0.4 mg/dL (ref 0.0–1.2)
Total Protein: 6.9 g/dL (ref 6.5–8.1)

## 2023-11-24 LAB — CBC WITH DIFFERENTIAL (CANCER CENTER ONLY)
Abs Immature Granulocytes: 0 K/uL (ref 0.00–0.07)
Basophils Absolute: 0.1 K/uL (ref 0.0–0.1)
Basophils Relative: 4 %
Eosinophils Absolute: 0.1 K/uL (ref 0.0–0.5)
Eosinophils Relative: 2 %
HCT: 39.7 % (ref 39.0–52.0)
Hemoglobin: 13.2 g/dL (ref 13.0–17.0)
Immature Granulocytes: 0 %
Lymphocytes Relative: 41 %
Lymphs Abs: 1.2 K/uL (ref 0.7–4.0)
MCH: 26.1 pg (ref 26.0–34.0)
MCHC: 33.2 g/dL (ref 30.0–36.0)
MCV: 78.5 fL — ABNORMAL LOW (ref 80.0–100.0)
Monocytes Absolute: 0.4 K/uL (ref 0.1–1.0)
Monocytes Relative: 12 %
Neutro Abs: 1.2 K/uL — ABNORMAL LOW (ref 1.7–7.7)
Neutrophils Relative %: 41 %
Platelet Count: 195 K/uL (ref 150–400)
RBC: 5.06 MIL/uL (ref 4.22–5.81)
RDW: 14.1 % (ref 11.5–15.5)
WBC Count: 3 K/uL — ABNORMAL LOW (ref 4.0–10.5)
nRBC: 0 % (ref 0.0–0.2)

## 2023-11-24 LAB — IRON AND TIBC
Iron: 45 ug/dL (ref 45–182)
Saturation Ratios: 11 % — ABNORMAL LOW (ref 17.9–39.5)
TIBC: 412 ug/dL (ref 250–450)
UIBC: 367 ug/dL

## 2023-11-24 LAB — FERRITIN: Ferritin: 144 ng/mL (ref 24–336)

## 2023-11-24 MED ORDER — PROCHLORPERAZINE MALEATE 10 MG PO TABS
10.0000 mg | ORAL_TABLET | Freq: Four times a day (QID) | ORAL | 2 refills | Status: AC | PRN
Start: 2023-11-24 — End: ?
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
## 2023-11-24 NOTE — Telephone Encounter (Addendum)
 He has been scheduled for Monday, 12/05/23 at 3:40 pm. That is Dr. Andy next available appointment.  Notified Mr. Premo of appointment and he agrees.

## 2023-11-24 NOTE — Progress Notes (Signed)
 Sent staff message to Dr. Andy nurse, [facility] requesting an appointment for nausea/vomiting. Inquired if new referral is needed.

## 2023-11-25 ENCOUNTER — Telehealth: Payer: Self-pay | Admitting: Oncology

## 2023-11-25 LAB — KAPPA/LAMBDA LIGHT CHAINS
Kappa free light chain: 27.7 mg/L — ABNORMAL HIGH (ref 3.3–19.4)
Kappa, lambda light chain ratio: 2.2 — ABNORMAL HIGH (ref 0.26–1.65)
Lambda free light chains: 12.6 mg/L (ref 5.7–26.3)

## 2023-11-25 NOTE — Telephone Encounter (Signed)
 Patient has been scheduled for follow-up visit per 11/24/23 LOS.  Pt aware of scheduled appt details.

## 2023-11-28 LAB — MULTIPLE MYELOMA PANEL, SERUM
Albumin SerPl Elph-Mcnc: 3.5 g/dL (ref 2.9–4.4)
Albumin/Glob SerPl: 1.3 (ref 0.7–1.7)
Alpha 1: 0.2 g/dL (ref 0.0–0.4)
Alpha2 Glob SerPl Elph-Mcnc: 0.7 g/dL (ref 0.4–1.0)
B-Globulin SerPl Elph-Mcnc: 1.1 g/dL (ref 0.7–1.3)
Gamma Glob SerPl Elph-Mcnc: 0.8 g/dL (ref 0.4–1.8)
Globulin, Total: 2.9 g/dL (ref 2.2–3.9)
IgA: 126 mg/dL (ref 90–386)
IgG (Immunoglobin G), Serum: 929 mg/dL (ref 603–1613)
IgM (Immunoglobulin M), Srm: 45 mg/dL (ref 20–172)
Total Protein ELP: 6.4 g/dL (ref 6.0–8.5)

## 2023-12-04 NOTE — Progress Notes (Deleted)
 8+   Gastroenterology Return Visit   Referring Provider Loris Elsie PARAS, PA-C [id number] [place],  [place] [address]  Primary Care Provider Loris Elsie PARAS, PA-C  Patient Profile: MR Karlene Bunting Streater is a 48 y.o. male with a history of multiple myeloma status post autologous stem cell transplant for 2021, AKI/ARF, hypertension, LLE DVT 2014 and superficial thrombosis left greater saphenous vein 2020 (no longer on anticoagulation) who returns to the gastroenterology office for follow-up of of the problem(s) noted below.  Problem List: Dysphagia/? 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 Tigges was last seen in the office 03/28/2023  Current GI Meds  Lomotil  as needed  Interval History   GERD/dysphagia -- Since stem cell transplant 2021 describes vomiting/regurgitation only of carbonated beverages -can no longer drink soda/Sprite -- Also notes sensation of potassium pills getting stuck in his throat/esophagus -- Otherwise denies dysphagia to other solids or liquids  -- EGD: Normal esophagus, stomach and duodenum; mildly tight cricopharyngeus Savary dilated to 16 mm  -- At today's visit he denies there has been substantial change in his dysphagia -- He is able to swallow food and drinks adequately -- Swallowing issues when he attempts to swallow a particular potassium tablet and carbonated beverages -- Carbonated beverages come right back up -- Has obtained the smallest version of of the potassium tablet but states that he feels like it gets stuck and needs to work its way down -- Does not have issues swallowing other pills because he thinks they are coded differently  --Discussed options of repeat dilation to 18-20 mm consideration of esophageal manometry -he declines further intervention at this time  Chronic diarrhea -- Endorses loose watery stool since  initiation of Revlimid  for multiple myeloma -- No blood or mucus in stool -- Colonoscopy 03/2023 normal -no microscopic colitis on biopsies -- Continues to have 4-5 loose bowel movements a day that he feels are manageable on Lomotil   Colon cancer screening -- Colonocopy 03/2023 - no polyps - repeat 2035 -- No family history of colorectal cancer or polyps  Last colonoscopy: 03/2013 - normal colon and TI, + IH; normal biopsies Last endoscopy: 03/2023 - Normal esophagus, stomach and duodenum; mildly tight cricopharyngeus Savary dilated to 16 mm  Last Abd CT/CTE/MRE: Whole body PET 12/2021 -no pertinent findings in GI tract  GI Review of Symptoms Significant for dysphagia and diarrhea. Otherwise negative.  General Review of Systems  Review of systems is significant for the pertinent positives and negatives as listed per the HPI.  Full ROS is otherwise negative.  Past Medical History   Past Medical History:  Diagnosis Date   Acute deep vein thrombosis (DVT) of left lower extremity (HCC)    Depression    Hypertension    Multiple myeloma (HCC)       Past Surgical History   Past Surgical History:  Procedure Laterality Date   BONE BIOPSY     Wisdom Tooth removal        Allergies and Medications   Allergies  Allergen Reactions   Pork-Derived Products Nausea And Vomiting   Aspirin Hives and Anxiety     Current Outpatient Medications:    amLODipine  (NORVASC ) 5 MG tablet, TAKE 1 TABLET(5 MG) BY MOUTH DAILY, Disp: 30 tablet, Rfl: 5   Biotin 1 MG CAPS, Take by mouth., Disp: , Rfl:    cyanocobalamin 2000 MCG tablet, Take 2,000  mcg by mouth daily., Disp: , Rfl:    cyclobenzaprine  (FLEXERIL ) 5 MG tablet, TAKE 1 TABLET BY MOUTH TWICE DAILY AS NEEDED FOR MUSCLE SPASMS, Disp: 30 tablet, Rfl: 0   diphenoxylate -atropine  (LOMOTIL ) 2.5-0.025 MG tablet, TAKE 1 AFTER EACH LOOSE STOOL MAX OF 6 PER DAY, Disp: 60 tablet, Rfl: 1   HYDROmorphone  (DILAUDID ) 2 MG tablet, Take 1 tablet (2 mg total) by  mouth every 6 (six) hours as needed for severe pain., Disp: 60 tablet, Rfl: 0   labetalol (NORMODYNE) 200 MG tablet, Take 200 mg by mouth 2 (two) times daily., Disp: , Rfl:    lenalidomide  (REVLIMID ) 5 MG capsule, Take 1 capsule (5 mg total) by mouth daily. Take 5 mg daily x 21 days on, then 7 day rest. Start cycle on 11/25/2023, Disp: 21 capsule, Rfl: 0   magnesium oxide (MAG-OX) 400 (240 Mg) MG tablet, Take 200 mg by mouth daily., Disp: , Rfl:    Multiple Vitamin (MULTIVITAMIN) tablet, Take 1 tablet by mouth daily., Disp: , Rfl:    potassium chloride  (KLOR-CON  M) 10 MEQ tablet, TAKE 1 TABLET(10 MEQ) BY MOUTH TWICE DAILY, Disp: 60 tablet, Rfl: 3   prochlorperazine  (COMPAZINE ) 10 MG tablet, Take 1 tablet (10 mg total) by mouth every 6 (six) hours as needed., Disp: 30 tablet, Rfl: 2   tadalafil (CIALIS) 20 MG tablet, Take 20 mg by mouth daily as needed., Disp: , Rfl:    Vitamin D , Ergocalciferol , (DRISDOL ) 1.25 MG (50000 UNIT) CAPS capsule, TAKE 1 CAPSULE BY MOUTH EVERY 7 DAYS, Disp: 4 capsule, Rfl: 3   Family History   Family History  Problem Relation Age of Onset   Heart disease Maternal Uncle    Diabetes Maternal Grandmother    Colon cancer Neg Hx    Colon polyps Neg Hx    Esophageal cancer Neg Hx    Rectal cancer Neg Hx    Stomach cancer Neg Hx     No colorectal cancer or polyps  Social History   Social History   Social History Narrative   Single, 2 children   Previous cigarette smoker -consumes 1 pack of cigarettes per week and quit in 2007   No current alcohol use -ceased consuming alcohol in 2007   Previously employed as a Production designer, theatre/television/film at [employer] -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   MR Karlene reports that he quit smoking about 18 years ago. His smoking use included cigarettes. He has never used smokeless tobacco. He reports that he does not drink alcohol and does not use drugs.  Vital Signs and Physical Examination   There were no vitals filed for this  visit.   There is no height or weight on file to calculate BMI.    Constitutional: well-nourished appearing nontoxic appearing 48 y.o. male in no apparent distress.   Eyes: Anicteric sclerae Head: Normocephalic/atraumatic Neuro: Grossly normal Skin: Normal   Review of Data  The following data was reviewed at the time of this encounter:  Laboratory Studies   Lab Results  Component Value Date   WBC 3.0 (L) 11/24/2023   HGB 13.2 11/24/2023   HCT 39.7 11/24/2023   MCV 78.5 (L) 11/24/2023   PLT 195 11/24/2023     Chemistry      Component Value Date/Time   NA 140 11/24/2023 0920   K 4.3 11/24/2023 0920   CL 109 11/24/2023 0920   CO2 20 (L) 11/24/2023 0920   BUN 8 11/24/2023 0920   CREATININE 1.15 11/24/2023 0920  Component Value Date/Time   CALCIUM 9.2 11/24/2023 0920   CALCIUM 13.0 (H) 11/21/2018 0923   ALKPHOS 66 11/24/2023 0920   AST 32 11/24/2023 0920   ALT 37 11/24/2023 0920   BILITOT 0.4 11/24/2023 0920      Lab Results  Component Value Date   TSH 1.670 10/19/2021     Imaging Studies  Whole-body PET 12/2021 1. No evidence for hypermetabolic myeloma on today's study. Specifically, the expansile right third rib lesion shows no hypermetabolism with uptake in this region at background marrow activity. No hypermetabolic lesion in the right chest wall. 2. Symmetric uptake identified in the region of the mandibular angle bilaterally without underlying bony or soft tissue lesion at this location. Apparent small level II lymph nodes bilaterally show some mild hypermetabolism, right greater than left. No lymphadenopathy by CT size criteria. Potentially reactive, follow-up may be warranted. Neck CT with contrast could be used to better evaluate lymph node size as clinically warranted.    GI Procedures and Studies  EGD/Colonoscopy 03/2023 EGD - Normal esophagus, stomach and duodenum; mildly tight cricopharyngeus Savary dilated to 16 mm - no H. pylori or  celiac disease Colonoscopy - normal colon and TI, + IH; normal biopsies  Clinical Impression  It is my clinical impression that Mr. Neisler is a 48 y.o. 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 Lieurance describes regurgitation of carbonated beverages and pill dysphagia typically with a potassium tablet since his stem cell transplant 2021.  Denies any difficulty swallowing other liquids or food.  Has a remote history of GERD type symptoms in the past but not currently endorsing any active GERD.  EGD 03/2023 was overall normal.  Cricopharyngeus was mildly tight and therefore an empiric Savary dilation was performed to 16 mm.  Mr. Vasco does not feel that his dysphagia has substantially changed since empiric dilation.  He is able to swallow food and most liquids adequately.  Notes that his only difficulties are with carbonated beverages as well as one particular potassium tablet -able to swallow other pills.  At today's visit we discussed options of further empiric dilation to 18-20 mm for consideration of esophageal manometry to evaluate for esophageal dysmotility.  He declines further intervention at this time.  If symptoms worsen or progress we can reevaluate this.  He reports chronic diarrhea with a temporal onset related to Revlimid  and states this is a common side effect.  Colonoscopy 03/2023 was endoscopically normal and biopsies ruled out microscopic colitis.  Discussed that his diarrhea may certainly be a side effect of Revlimid .  He feels he is able to maintain an adequate quality of life using Lomotil  as needed.  He is up-to-date regarding  colorectal cancer screening-colonoscopy 03/2023 was normal-next due 03/2033  Plan  Continue conservative management regarding symptoms of dysphagia which are isolated to carbonated beverages and a potassium tablet.  If symptoms worsen or progress can reevaluate further dilation and/or esophageal manometry Continue Lomotil  as needed for management of diarrhea related to Revlimid  Up-to-date for colorectal cancer screening-next colonoscopy due 03/2033  Planned Follow Up PRN  The patient or caregiver verbalized understanding of the material covered, with no barriers to understanding. All questions were answered. Patient or  caregiver is agreeable with the plan outlined above.    It was a pleasure to see MR McKinley.  If you have any questions or concerns regarding this evaluation, do not hesitate to contact me.   Inocente Hausen, MD [facility]

## 2023-12-05 ENCOUNTER — Encounter: Payer: Self-pay | Admitting: Pediatrics

## 2023-12-05 ENCOUNTER — Ambulatory Visit: Admitting: Pediatrics

## 2023-12-14 ENCOUNTER — Other Ambulatory Visit: Payer: Self-pay | Admitting: *Deleted

## 2023-12-14 DIAGNOSIS — C9000 Multiple myeloma not having achieved remission: Secondary | ICD-10-CM

## 2023-12-14 MED ORDER — LENALIDOMIDE 5 MG PO CAPS: 5.0000 mg | ORAL_CAPSULE | Freq: Every day | ORAL | 0 refills | Status: DC

## 2023-12-14 NOTE — Telephone Encounter (Signed)
 Received faxed refill request for Lenalidomide .

## 2023-12-22 ENCOUNTER — Other Ambulatory Visit: Payer: Self-pay | Admitting: Oncology

## 2023-12-22 ENCOUNTER — Telehealth: Payer: Self-pay | Admitting: *Deleted

## 2023-12-22 DIAGNOSIS — C9000 Multiple myeloma not having achieved remission: Secondary | ICD-10-CM

## 2023-12-22 NOTE — Telephone Encounter (Signed)
 LVM that Dr. Cloretta wants him to delay his revlimid  start till 10/10 when he is seen.

## 2023-12-27 ENCOUNTER — Inpatient Hospital Stay: Attending: Oncology

## 2023-12-27 ENCOUNTER — Inpatient Hospital Stay (HOSPITAL_BASED_OUTPATIENT_CLINIC_OR_DEPARTMENT_OTHER): Admitting: Nurse Practitioner

## 2023-12-27 ENCOUNTER — Encounter: Payer: Self-pay | Admitting: Nurse Practitioner

## 2023-12-27 VITALS — BP 118/88 | HR 83 | Temp 98.0°F | Resp 18 | Ht 71.0 in | Wt 201.1 lb

## 2023-12-27 DIAGNOSIS — C9000 Multiple myeloma not having achieved remission: Secondary | ICD-10-CM | POA: Diagnosis not present

## 2023-12-27 DIAGNOSIS — G893 Neoplasm related pain (acute) (chronic): Secondary | ICD-10-CM | POA: Diagnosis not present

## 2023-12-27 DIAGNOSIS — G629 Polyneuropathy, unspecified: Secondary | ICD-10-CM | POA: Insufficient documentation

## 2023-12-27 DIAGNOSIS — N19 Unspecified kidney failure: Secondary | ICD-10-CM | POA: Diagnosis not present

## 2023-12-27 DIAGNOSIS — Z86718 Personal history of other venous thrombosis and embolism: Secondary | ICD-10-CM | POA: Diagnosis not present

## 2023-12-27 DIAGNOSIS — R21 Rash and other nonspecific skin eruption: Secondary | ICD-10-CM | POA: Insufficient documentation

## 2023-12-27 DIAGNOSIS — R531 Weakness: Secondary | ICD-10-CM | POA: Insufficient documentation

## 2023-12-27 DIAGNOSIS — I10 Essential (primary) hypertension: Secondary | ICD-10-CM | POA: Insufficient documentation

## 2023-12-27 LAB — CBC WITH DIFFERENTIAL (CANCER CENTER ONLY)
Abs Immature Granulocytes: 0.01 K/uL (ref 0.00–0.07)
Basophils Absolute: 0.2 K/uL — ABNORMAL HIGH (ref 0.0–0.1)
Basophils Relative: 4 %
Eosinophils Absolute: 0.1 K/uL (ref 0.0–0.5)
Eosinophils Relative: 2 %
HCT: 40.6 % (ref 39.0–52.0)
Hemoglobin: 13.5 g/dL (ref 13.0–17.0)
Immature Granulocytes: 0 %
Lymphocytes Relative: 43 %
Lymphs Abs: 1.7 K/uL (ref 0.7–4.0)
MCH: 26.2 pg (ref 26.0–34.0)
MCHC: 33.3 g/dL (ref 30.0–36.0)
MCV: 78.7 fL — ABNORMAL LOW (ref 80.0–100.0)
Monocytes Absolute: 0.6 K/uL (ref 0.1–1.0)
Monocytes Relative: 14 %
Neutro Abs: 1.5 K/uL — ABNORMAL LOW (ref 1.7–7.7)
Neutrophils Relative %: 37 %
Platelet Count: 201 K/uL (ref 150–400)
RBC: 5.16 MIL/uL (ref 4.22–5.81)
RDW: 13.6 % (ref 11.5–15.5)
WBC Count: 4 K/uL (ref 4.0–10.5)
nRBC: 0 % (ref 0.0–0.2)

## 2023-12-27 LAB — CMP (CANCER CENTER ONLY)
ALT: 54 U/L — ABNORMAL HIGH (ref 0–44)
AST: 49 U/L — ABNORMAL HIGH (ref 15–41)
Albumin: 4.3 g/dL (ref 3.5–5.0)
Alkaline Phosphatase: 78 U/L (ref 38–126)
Anion gap: 12 (ref 5–15)
BUN: 14 mg/dL (ref 6–20)
CO2: 22 mmol/L (ref 22–32)
Calcium: 10 mg/dL (ref 8.9–10.3)
Chloride: 105 mmol/L (ref 98–111)
Creatinine: 1.27 mg/dL — ABNORMAL HIGH (ref 0.61–1.24)
GFR, Estimated: >60 mL/min (ref >60)
Glucose, Bld: 78 mg/dL (ref 70–99)
Potassium: 4.4 mmol/L (ref 3.5–5.1)
Sodium: 140 mmol/L (ref 135–145)
Total Bilirubin: 0.3 mg/dL (ref 0.0–1.2)
Total Protein: 7.1 g/dL (ref 6.5–8.1)
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
## 2024-01-11 ENCOUNTER — Other Ambulatory Visit: Payer: Self-pay | Admitting: Oncology

## 2024-01-11 DIAGNOSIS — C9000 Multiple myeloma not having achieved remission: Secondary | ICD-10-CM

## 2024-01-12 ENCOUNTER — Encounter: Payer: Self-pay | Admitting: Oncology

## 2024-01-12 ENCOUNTER — Other Ambulatory Visit: Payer: Self-pay | Admitting: Oncology

## 2024-01-12 DIAGNOSIS — C9000 Multiple myeloma not having achieved remission: Secondary | ICD-10-CM

## 2024-01-13 ENCOUNTER — Encounter: Payer: Self-pay | Admitting: Oncology

## 2024-01-16 ENCOUNTER — Other Ambulatory Visit: Payer: Self-pay | Admitting: Oncology

## 2024-01-24 ENCOUNTER — Inpatient Hospital Stay

## 2024-01-24 ENCOUNTER — Inpatient Hospital Stay: Admitting: Oncology

## 2024-01-24 ENCOUNTER — Inpatient Hospital Stay: Attending: Oncology

## 2024-01-24 VITALS — BP 113/83 | HR 57 | Resp 18

## 2024-01-24 VITALS — BP 116/84 | HR 66 | Temp 97.8°F | Resp 18 | Ht 71.0 in | Wt 210.8 lb

## 2024-01-24 DIAGNOSIS — I10 Essential (primary) hypertension: Secondary | ICD-10-CM | POA: Diagnosis not present

## 2024-01-24 DIAGNOSIS — C9000 Multiple myeloma not having achieved remission: Secondary | ICD-10-CM | POA: Insufficient documentation

## 2024-01-24 DIAGNOSIS — M549 Dorsalgia, unspecified: Secondary | ICD-10-CM | POA: Insufficient documentation

## 2024-01-24 DIAGNOSIS — Z86718 Personal history of other venous thrombosis and embolism: Secondary | ICD-10-CM | POA: Diagnosis not present

## 2024-01-24 DIAGNOSIS — R531 Weakness: Secondary | ICD-10-CM | POA: Diagnosis not present

## 2024-01-24 DIAGNOSIS — N19 Unspecified kidney failure: Secondary | ICD-10-CM | POA: Insufficient documentation

## 2024-01-24 DIAGNOSIS — D701 Agranulocytosis secondary to cancer chemotherapy: Secondary | ICD-10-CM | POA: Insufficient documentation

## 2024-01-24 DIAGNOSIS — R0789 Other chest pain: Secondary | ICD-10-CM | POA: Insufficient documentation

## 2024-01-24 LAB — CBC WITH DIFFERENTIAL (CANCER CENTER ONLY)
Abs Immature Granulocytes: 0.01 K/uL (ref 0.00–0.07)
Basophils Absolute: 0.1 K/uL (ref 0.0–0.1)
Basophils Relative: 3 %
Eosinophils Absolute: 0.1 K/uL (ref 0.0–0.5)
Eosinophils Relative: 2 %
HCT: 39.9 % (ref 39.0–52.0)
Hemoglobin: 13.4 g/dL (ref 13.0–17.0)
Immature Granulocytes: 0 %
Lymphocytes Relative: 45 %
Lymphs Abs: 1.4 K/uL (ref 0.7–4.0)
MCH: 26.6 pg (ref 26.0–34.0)
MCHC: 33.6 g/dL (ref 30.0–36.0)
MCV: 79.2 fL — ABNORMAL LOW (ref 80.0–100.0)
Monocytes Absolute: 0.4 K/uL (ref 0.1–1.0)
Monocytes Relative: 13 %
Neutro Abs: 1.1 K/uL — ABNORMAL LOW (ref 1.7–7.7)
Neutrophils Relative %: 37 %
Platelet Count: 212 K/uL (ref 150–400)
RBC: 5.04 MIL/uL (ref 4.22–5.81)
RDW: 14.2 % (ref 11.5–15.5)
WBC Count: 3.1 K/uL — ABNORMAL LOW (ref 4.0–10.5)
nRBC: 0 % (ref 0.0–0.2)

## 2024-01-24 LAB — CMP (CANCER CENTER ONLY)
ALT: 20 U/L (ref 0–44)
AST: 24 U/L (ref 15–41)
Albumin: 4.3 g/dL (ref 3.5–5.0)
Alkaline Phosphatase: 65 U/L (ref 38–126)
Anion gap: 9 (ref 5–15)
BUN: 11 mg/dL (ref 6–20)
CO2: 24 mmol/L (ref 22–32)
Calcium: 9.7 mg/dL (ref 8.9–10.3)
Chloride: 109 mmol/L (ref 98–111)
Creatinine: 1.26 mg/dL — ABNORMAL HIGH (ref 0.61–1.24)
GFR, Estimated: >60 mL/min (ref >60)
Glucose, Bld: 96 mg/dL (ref 70–99)
Potassium: 4.3 mmol/L (ref 3.5–5.1)
Sodium: 141 mmol/L (ref 135–145)
Total Bilirubin: 0.4 mg/dL (ref 0.0–1.2)
Total Protein: 6.9 g/dL (ref 6.5–8.1)

## 2024-01-24 MED ORDER — ZOLEDRONIC ACID 4 MG/100ML IV SOLN
4.0000 mg | Freq: Once | INTRAVENOUS | Status: AC
  Administered 2024-01-24: 4 mg via INTRAVENOUS
  Filled 2024-01-24: qty 100

## 2024-01-24 MED ORDER — SODIUM CHLORIDE 0.9% FLUSH: 10.0000 mL | Freq: Two times a day (BID) | INTRAVENOUS | Status: DC
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
## 2024-01-24 NOTE — Patient Instructions (Signed)

## 2024-01-24 NOTE — Progress Notes (Signed)
 Patient seen by Dr. Arley Hof today  Vitals are within treatment parameters:Yes   Labs are within treatment parameters: Yes   Treatment plan has been signed: Yes   Per physician team, Patient is ready for treatment and there are NO modifications to the treatment plan. Ready for Zometa 

## 2024-01-25 LAB — KAPPA/LAMBDA LIGHT CHAINS
Kappa free light chain: 28.4 mg/L — ABNORMAL HIGH (ref 3.3–19.4)
Kappa, lambda light chain ratio: 2.03 — ABNORMAL HIGH (ref 0.26–1.65)
Lambda free light chains: 14 mg/L (ref 5.7–26.3)

## 2024-01-30 LAB — MULTIPLE MYELOMA PANEL, SERUM
Albumin SerPl Elph-Mcnc: 3.9 g/dL (ref 2.9–4.4)
Albumin/Glob SerPl: 1.5 (ref 0.7–1.7)
Alpha 1: 0.1 g/dL (ref 0.0–0.4)
Alpha2 Glob SerPl Elph-Mcnc: 0.6 g/dL (ref 0.4–1.0)
B-Globulin SerPl Elph-Mcnc: 1.2 g/dL (ref 0.7–1.3)
Gamma Glob SerPl Elph-Mcnc: 0.7 g/dL (ref 0.4–1.8)
Globulin, Total: 2.7 g/dL (ref 2.2–3.9)
IgA: 120 mg/dL (ref 90–386)
IgG (Immunoglobin G), Serum: 936 mg/dL (ref 603–1613)
IgM (Immunoglobulin M), Srm: 43 mg/dL (ref 20–172)
Total Protein ELP: 6.6 g/dL (ref 6.0–8.5)

## 2024-02-14 ENCOUNTER — Other Ambulatory Visit: Payer: Self-pay | Admitting: *Deleted

## 2024-02-14 DIAGNOSIS — C9000 Multiple myeloma not having achieved remission: Secondary | ICD-10-CM

## 2024-02-14 MED ORDER — LENALIDOMIDE 5 MG PO CAPS: 5.0000 mg | ORAL_CAPSULE | Freq: Every day | ORAL | 0 refills | Status: DC

## 2024-02-14 NOTE — Telephone Encounter (Signed)
 Faxed refill request received from [facility] for Lenalidomide 

## 2024-02-21 ENCOUNTER — Encounter: Payer: Self-pay | Admitting: Nurse Practitioner

## 2024-02-21 ENCOUNTER — Inpatient Hospital Stay: Attending: Oncology

## 2024-02-21 ENCOUNTER — Inpatient Hospital Stay: Admitting: Nurse Practitioner

## 2024-02-21 VITALS — BP 139/89 | HR 86 | Temp 97.8°F | Resp 18 | Ht 71.0 in | Wt 216.0 lb

## 2024-02-21 DIAGNOSIS — N19 Unspecified kidney failure: Secondary | ICD-10-CM | POA: Insufficient documentation

## 2024-02-21 DIAGNOSIS — Z86718 Personal history of other venous thrombosis and embolism: Secondary | ICD-10-CM | POA: Insufficient documentation

## 2024-02-21 DIAGNOSIS — G893 Neoplasm related pain (acute) (chronic): Secondary | ICD-10-CM | POA: Insufficient documentation

## 2024-02-21 DIAGNOSIS — I10 Essential (primary) hypertension: Secondary | ICD-10-CM | POA: Insufficient documentation

## 2024-02-21 DIAGNOSIS — C9000 Multiple myeloma not having achieved remission: Secondary | ICD-10-CM | POA: Diagnosis not present

## 2024-02-21 DIAGNOSIS — R531 Weakness: Secondary | ICD-10-CM | POA: Insufficient documentation

## 2024-02-21 LAB — CBC WITH DIFFERENTIAL (CANCER CENTER ONLY)
Abs Immature Granulocytes: 0.01 K/uL (ref 0.00–0.07)
Basophils Absolute: 0.1 K/uL (ref 0.0–0.1)
Basophils Relative: 3 %
Eosinophils Absolute: 0.1 K/uL (ref 0.0–0.5)
Eosinophils Relative: 3 %
HCT: 39.2 % (ref 39.0–52.0)
Hemoglobin: 13.3 g/dL (ref 13.0–17.0)
Immature Granulocytes: 0 %
Lymphocytes Relative: 42 %
Lymphs Abs: 1.9 K/uL (ref 0.7–4.0)
MCH: 26.5 pg (ref 26.0–34.0)
MCHC: 33.9 g/dL (ref 30.0–36.0)
MCV: 78.1 fL — ABNORMAL LOW (ref 80.0–100.0)
Monocytes Absolute: 0.6 K/uL (ref 0.1–1.0)
Monocytes Relative: 13 %
Neutro Abs: 1.8 K/uL (ref 1.7–7.7)
Neutrophils Relative %: 39 %
Platelet Count: 203 K/uL (ref 150–400)
RBC: 5.02 MIL/uL (ref 4.22–5.81)
RDW: 13.9 % (ref 11.5–15.5)
WBC Count: 4.5 K/uL (ref 4.0–10.5)
nRBC: 0 % (ref 0.0–0.2)

## 2024-02-21 LAB — CMP (CANCER CENTER ONLY)
ALT: 48 U/L — ABNORMAL HIGH (ref 0–44)
AST: 31 U/L (ref 15–41)
Albumin: 4.1 g/dL (ref 3.5–5.0)
Alkaline Phosphatase: 69 U/L (ref 38–126)
Anion gap: 11 (ref 5–15)
BUN: 13 mg/dL (ref 6–20)
CO2: 23 mmol/L (ref 22–32)
Calcium: 9.6 mg/dL (ref 8.9–10.3)
Chloride: 107 mmol/L (ref 98–111)
Creatinine: 1.14 mg/dL (ref 0.61–1.24)
GFR, Estimated: >60 mL/min (ref >60)
Glucose, Bld: 108 mg/dL — ABNORMAL HIGH (ref 70–99)
Potassium: 4.1 mmol/L (ref 3.5–5.1)
Sodium: 140 mmol/L (ref 135–145)
Total Bilirubin: 0.3 mg/dL (ref 0.0–1.2)
Total Protein: 6.8 g/dL (ref 6.5–8.1)

## 2024-02-21 MED ORDER — CYCLOBENZAPRINE HCL 5 MG PO TABS: 5.0000 mg | ORAL_TABLET | Freq: Two times a day (BID) | ORAL | 0 refills | Status: DC | PRN
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
## 2024-02-28 ENCOUNTER — Other Ambulatory Visit: Payer: Self-pay | Admitting: *Deleted

## 2024-02-28 DIAGNOSIS — C9000 Multiple myeloma not having achieved remission: Secondary | ICD-10-CM

## 2024-02-28 DIAGNOSIS — Z7689 Persons encountering health services in other specified circumstances: Secondary | ICD-10-CM

## 2024-02-28 NOTE — Progress Notes (Signed)
 Called [facility] re: referral placed. Was told by employee to re-enter the order, not as a referral but as order for sleep evaluation and it will roll over to make a referral. Order placed as requested.SABRA

## 2024-03-07 NOTE — Progress Notes (Unsigned)
 8+  [facility] Return Visit   Referring Provider Loris Elsie PARAS, PA-C [id number] [place],  [place] [address]  Primary Care Provider Loris Elsie PARAS, PA-C  Patient Profile: MR Karlene Bunting [place] is a 48 y.o. male with a history of multiple myeloma status post autologous stem cell transplant for 2021, AKI/ARF, hypertension, LLE DVT 2014 and superficial thrombosis left greater saphenous vein 2020 (no longer on anticoagulation) who returns to the gastroenterology office for follow-up of of the problem(s) noted below.  Problem List: Dysphagia/? 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 Klich was last seen in the office 03/28/2023  Current GI Meds  Lomotil  as needed  Interval History   GERD/dysphagia -- Since stem cell transplant 2021 describes vomiting/regurgitation only of carbonated beverages -can no longer drink soda/Sprite -- Also notes sensation of potassium pills getting stuck in his throat/esophagus -- Otherwise denies dysphagia to other solids or liquids  -- EGD 03/2023: Normal esophagus, stomach and duodenum; mildly tight cricopharyngeus Savary dilated to 16 mm  -- At today's visit he denies there has been substantial change in his dysphagia -- He is able to swallow food and drinks adequately -- Swallowing issues when he attempts to swallow a particular potassium tablet and carbonated beverages -- Carbonated beverages come right back up -- Has obtained the smallest version of of the potassium tablet but states that he feels like it gets stuck and needs to work its way down -- Does not have issues swallowing other pills because he thinks they are coded differently  --Discussed options of repeat dilation to 18-20 mm consideration of esophageal manometry -he declines further intervention at this time  Chronic diarrhea -- Endorses loose watery stool since  initiation of Revlimid  for multiple myeloma -- No blood or mucus in stool -- Colonoscopy 03/2023 normal -no microscopic colitis on biopsies -- Continues to have 4-5 loose bowel movements a day that he feels are manageable on Lomotil   Colon cancer screening -- Colonocopy 03/2023 - no polyps - repeat 2035 -- No family history of colorectal cancer or polyps  Last colonoscopy: 03/2013 - normal colon and TI, + IH; normal biopsies Last endoscopy: 03/2023 - Normal esophagus, stomach and duodenum; mildly tight cricopharyngeus Savary dilated to 16 mm  Last Abd CT/CTE/MRE: Whole body PET 12/2021 -no pertinent findings in GI tract  GI Review of Symptoms Significant for dysphagia and diarrhea. Otherwise negative.  General Review of Systems  Review of systems is significant for the pertinent positives and negatives as listed per the HPI.  Full ROS is otherwise negative.  Past Medical History   Past Medical History:  Diagnosis Date   Acute deep vein thrombosis (DVT) of left lower extremity (HCC)    Depression    Hypertension    Multiple myeloma (HCC)       Past Surgical History   Past Surgical History:  Procedure Laterality Date   BONE BIOPSY     Wisdom Tooth removal        Allergies and Medications   Allergies  Allergen Reactions   Porcine (Pork) Protein-Containing Drug Products Nausea And Vomiting   Aspirin Hives and Anxiety     Current Outpatient Medications:    amLODipine  (NORVASC ) 5 MG tablet, TAKE 1 TABLET(5 MG) BY MOUTH DAILY, Disp: 30 tablet, Rfl: 5   Biotin 1 MG CAPS, Take by mouth., Disp: , Rfl:    cyanocobalamin 2000  MCG tablet, Take 2,000 mcg by mouth daily., Disp: , Rfl:    cyclobenzaprine  (FLEXERIL ) 5 MG tablet, Take 1 tablet (5 mg total) by mouth 2 (two) times daily as needed for muscle spasms., Disp: 30 tablet, Rfl: 0   diphenoxylate -atropine  (LOMOTIL ) 2.5-0.025 MG tablet, TAKE 1 AFTER EACH LOOSE STOOL MAX OF 6 PER DAY, Disp: 60 tablet, Rfl: 1   HYDROmorphone   (DILAUDID ) 2 MG tablet, Take 1 tablet (2 mg total) by mouth every 6 (six) hours as needed for severe pain. (Patient not taking: Reported on 12/27/2023), Disp: 60 tablet, Rfl: 0   labetalol (NORMODYNE) 200 MG tablet, Take 200 mg by mouth 2 (two) times daily., Disp: , Rfl:    lenalidomide  (REVLIMID ) 5 MG capsule, Take 1 capsule (5 mg total) by mouth daily. 5 mg daily x 21 days on, then 7 days off. Repeat every 28 days. Start cycle on 02/21/2024, Disp: 21 capsule, Rfl: 0   magnesium oxide (MAG-OX) 400 (240 Mg) MG tablet, Take 200 mg by mouth daily., Disp: , Rfl:    Multiple Vitamin (MULTIVITAMIN) tablet, Take 1 tablet by mouth daily., Disp: , Rfl:    potassium chloride  (KLOR-CON  M) 10 MEQ tablet, TAKE 1 TABLET(10 MEQ) BY MOUTH TWICE DAILY, Disp: 180 tablet, Rfl: 0   prochlorperazine  (COMPAZINE ) 10 MG tablet, Take 1 tablet (10 mg total) by mouth every 6 (six) hours as needed. (Patient not taking: Reported on 12/27/2023), Disp: 30 tablet, Rfl: 2   tadalafil (CIALIS) 20 MG tablet, Take 20 mg by mouth daily as needed., Disp: , Rfl:    Vitamin D , Ergocalciferol , (DRISDOL ) 1.25 MG (50000 UNIT) CAPS capsule, TAKE 1 CAPSULE BY MOUTH EVERY 7 DAYS, Disp: 4 capsule, Rfl: 3   Family History   Family History  Problem Relation Age of Onset   Heart disease Maternal Uncle    Diabetes Maternal Grandmother    Colon cancer Neg Hx    Colon polyps Neg Hx    Esophageal cancer Neg Hx    Rectal cancer Neg Hx    Stomach cancer Neg Hx     No colorectal cancer or polyps  Social History   Social History   Social History Narrative   Single, 2 children   Previous cigarette smoker -consumes 1 pack of cigarettes per week and quit in 2007   No current alcohol use -ceased consuming alcohol in 2007   Previously employed as a production designer, theatre/television/film at [employer] -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   MR Karlene reports that he quit smoking about 18 years ago. His smoking use included cigarettes. He has never used  smokeless tobacco. He reports that he does not drink alcohol and does not use drugs.  Vital Signs and Physical Examination   There were no vitals filed for this visit.   There is no height or weight on file to calculate BMI.    Constitutional: well-nourished appearing nontoxic appearing 48 y.o. male in no apparent distress.   Eyes: Anicteric sclerae Head: Normocephalic/atraumatic Neuro: Grossly normal Skin: Normal   Review of Data  The following data was reviewed at the time of this encounter:  Laboratory Studies   Lab Results  Component Value Date   WBC 4.5 02/21/2024   HGB 13.3 02/21/2024   HCT 39.2 02/21/2024   MCV 78.1 (L) 02/21/2024   PLT 203 02/21/2024     Chemistry      Component Value Date/Time   NA 140 02/21/2024 1058   K 4.1 02/21/2024 1058  CL 107 02/21/2024 1058   CO2 23 02/21/2024 1058   BUN 13 02/21/2024 1058   CREATININE 1.14 02/21/2024 1058      Component Value Date/Time   CALCIUM 9.6 02/21/2024 1058   CALCIUM 13.0 (H) 11/21/2018 0923   ALKPHOS 69 02/21/2024 1058   AST 31 02/21/2024 1058   ALT 48 (H) 02/21/2024 1058   BILITOT 0.3 02/21/2024 1058      Lab Results  Component Value Date   TSH 1.670 10/19/2021     Imaging Studies  Whole-body PET 12/2021 1. No evidence for hypermetabolic myeloma on today's study. Specifically, the expansile right third rib lesion shows no hypermetabolism with uptake in this region at background marrow activity. No hypermetabolic lesion in the right chest wall. 2. Symmetric uptake identified in the region of the mandibular angle bilaterally without underlying bony or soft tissue lesion at this location. Apparent small level II lymph nodes bilaterally show some mild hypermetabolism, right greater than left. No lymphadenopathy by CT size criteria. Potentially reactive, follow-up may be warranted. Neck CT with contrast could be used to better evaluate lymph node size as clinically warranted.    GI  Procedures and Studies  EGD/Colonoscopy 03/2023 EGD - Normal esophagus, stomach and duodenum; mildly tight cricopharyngeus Savary dilated to 16 mm Colonoscopy - normal colon and TI, + IH; normal biopsies  Clinical Impression  It is my clinical impression that Mr. Juenger is a 48 y.o. 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 Gilpatrick describes regurgitation of carbonated beverages and pill dysphagia typically with a potassium tablet since his stem cell transplant 2021.  Denies any difficulty swallowing other liquids or food.  Has a remote history of GERD type symptoms in the past but not currently endorsing any active GERD.  EGD 03/2023 was overall normal.  Cricopharyngeus was mildly tight and therefore an empiric Savary dilation was performed to 16 mm.  Mr. Honor does not feel that his dysphagia has substantially changed since empiric dilation.  He is able to swallow food and most liquids adequately.  Notes that his only difficulties are with carbonated beverages as well as one particular potassium tablet -able to swallow other pills.  At today's visit we discussed options of further empiric dilation to 18-20 mm for consideration of esophageal manometry to evaluate for esophageal dysmotility.  He declines further intervention at this time.  If symptoms worsen or progress we can reevaluate this.  He reports chronic diarrhea with a temporal onset related to Revlimid  and states this is a common side effect.  Colonoscopy 03/2023 was endoscopically normal and biopsies ruled out microscopic colitis.  Discussed that his diarrhea may certainly be a side effect  of Revlimid .  He feels he is able to maintain an adequate quality of life using Lomotil  as needed.  He is up-to-date regarding colorectal cancer screening-colonoscopy 03/2023 was normal-next due 03/2033  Plan  Continue conservative management regarding symptoms of dysphagia which are isolated to carbonated beverages and a potassium tablet.  If symptoms worsen or progress can reevaluate further dilation and/or esophageal manometry Continue Lomotil  as needed for management of diarrhea related to Revlimid  Up-to-date for colorectal cancer screening-next colonoscopy due 03/2033  Planned Follow Up PRN  The patient or caregiver verbalized understanding of the material covered, with no barriers to understanding. All questions were answered. Patient or caregiver is agreeable with the plan outlined above.    It was a pleasure to see MR Loma.  If you have any questions or concerns regarding this evaluation, do not hesitate to contact me.   Inocente Hausen, MD [facility]

## 2024-03-08 ENCOUNTER — Encounter: Payer: Self-pay | Admitting: Pediatrics

## 2024-03-08 ENCOUNTER — Ambulatory Visit: Admitting: Pediatrics

## 2024-03-08 VITALS — BP 122/70 | HR 77 | Ht 73.0 in | Wt 221.2 lb

## 2024-03-08 DIAGNOSIS — R197 Diarrhea, unspecified: Secondary | ICD-10-CM

## 2024-03-08 DIAGNOSIS — T451X5A Adverse effect of antineoplastic and immunosuppressive drugs, initial encounter: Secondary | ICD-10-CM | POA: Diagnosis not present

## 2024-03-08 DIAGNOSIS — K529 Noninfective gastroenteritis and colitis, unspecified: Secondary | ICD-10-CM | POA: Diagnosis not present

## 2024-03-08 DIAGNOSIS — R1310 Dysphagia, unspecified: Secondary | ICD-10-CM

## 2024-03-08 DIAGNOSIS — K30 Functional dyspepsia: Secondary | ICD-10-CM | POA: Diagnosis not present

## 2024-03-08 NOTE — Patient Instructions (Signed)
 Thank you for entrusting me with your care and for choosing [facility], Dr. Inocente Hausen  _______________________________________________________  If your blood pressure at your visit was 140/90 or greater, please contact your primary care physician to follow up on this.  _______________________________________________________  If you are age 50 or older, your body mass index should be between 23-30. Your Body mass index is 29.19 kg/m. If this is out of the aforementioned range listed, please consider follow up with your Primary Care Provider.  If you are age 55 or younger, your body mass index should be between 19-25. Your Body mass index is 29.19 kg/m. If this is out of the aformentioned range listed, please consider follow up with your Primary Care Provider.   ________________________________________________________  The [facility] providers would like to encourage you to use MYCHART to communicate with providers for non-urgent requests or questions.  Due to long hold times on the telephone, sending your provider a message by [facility] may be a faster and more efficient way to get a response.  Please allow 48 business hours for a response.  Please remember that this is for non-urgent requests.  _______________________________________________________  [facility] is using a team-based approach to care.  Your team is made up of your doctor and two to three APPS. Our APPS (Nurse Practitioners and Physician Assistants) work with your physician to ensure care continuity for you. They are fully qualified to address your health concerns and develop a treatment plan. They communicate directly with your gastroenterologist to care for you. Seeing the Advanced Practice Practitioners on your physician's team can help you by facilitating care more promptly, often allowing for earlier appointments, access to diagnostic testing, procedures, and other specialty referrals.

## 2024-03-13 ENCOUNTER — Other Ambulatory Visit: Payer: Self-pay | Admitting: Oncology

## 2024-03-13 DIAGNOSIS — C9000 Multiple myeloma not having achieved remission: Secondary | ICD-10-CM

## 2024-03-20 ENCOUNTER — Inpatient Hospital Stay

## 2024-03-20 ENCOUNTER — Inpatient Hospital Stay (HOSPITAL_BASED_OUTPATIENT_CLINIC_OR_DEPARTMENT_OTHER): Admitting: Oncology

## 2024-03-20 VITALS — BP 112/90 | HR 75 | Temp 98.1°F | Resp 18 | Ht 73.0 in | Wt 216.5 lb

## 2024-03-20 DIAGNOSIS — C9000 Multiple myeloma not having achieved remission: Secondary | ICD-10-CM

## 2024-03-20 LAB — CBC WITH DIFFERENTIAL (CANCER CENTER ONLY)
Abs Immature Granulocytes: 0.01 K/uL (ref 0.00–0.07)
Basophils Absolute: 0.1 K/uL (ref 0.0–0.1)
Basophils Relative: 2 %
Eosinophils Absolute: 0.2 K/uL (ref 0.0–0.5)
Eosinophils Relative: 3 %
HCT: 39.4 % (ref 39.0–52.0)
Hemoglobin: 13.3 g/dL (ref 13.0–17.0)
Immature Granulocytes: 0 %
Lymphocytes Relative: 35 %
Lymphs Abs: 1.6 K/uL (ref 0.7–4.0)
MCH: 26.5 pg (ref 26.0–34.0)
MCHC: 33.8 g/dL (ref 30.0–36.0)
MCV: 78.5 fL — ABNORMAL LOW (ref 80.0–100.0)
Monocytes Absolute: 0.5 K/uL (ref 0.1–1.0)
Monocytes Relative: 11 %
Neutro Abs: 2.3 K/uL (ref 1.7–7.7)
Neutrophils Relative %: 49 %
Platelet Count: 198 K/uL (ref 150–400)
RBC: 5.02 MIL/uL (ref 4.22–5.81)
RDW: 13.9 % (ref 11.5–15.5)
WBC Count: 4.7 K/uL (ref 4.0–10.5)
nRBC: 0 % (ref 0.0–0.2)

## 2024-03-20 LAB — CMP (CANCER CENTER ONLY)
ALT: 29 U/L (ref 0–44)
AST: 41 U/L (ref 15–41)
Albumin: 4.3 g/dL (ref 3.5–5.0)
Alkaline Phosphatase: 61 U/L (ref 38–126)
Anion gap: 10 (ref 5–15)
BUN: 14 mg/dL (ref 6–20)
CO2: 24 mmol/L (ref 22–32)
Calcium: 9.1 mg/dL (ref 8.9–10.3)
Chloride: 106 mmol/L (ref 98–111)
Creatinine: 1.44 mg/dL — ABNORMAL HIGH (ref 0.61–1.24)
GFR, Estimated: 59 mL/min — ABNORMAL LOW
Glucose, Bld: 86 mg/dL (ref 70–99)
Potassium: 4.1 mmol/L (ref 3.5–5.1)
Sodium: 140 mmol/L (ref 135–145)
Total Bilirubin: 1 mg/dL (ref 0.0–1.2)
Total Protein: 6.9 g/dL (ref 6.5–8.1)
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
## 2024-03-26 DIAGNOSIS — C9000 Multiple myeloma not having achieved remission: Secondary | ICD-10-CM

## 2024-03-28 ENCOUNTER — Encounter: Payer: Self-pay | Admitting: Oncology

## 2024-04-10 ENCOUNTER — Encounter: Payer: Self-pay | Admitting: Oncology

## 2024-04-11 ENCOUNTER — Other Ambulatory Visit: Payer: Self-pay | Admitting: Oncology

## 2024-04-11 DIAGNOSIS — C9000 Multiple myeloma not having achieved remission: Secondary | ICD-10-CM

## 2024-04-13 ENCOUNTER — Encounter: Payer: Self-pay | Admitting: *Deleted

## 2024-04-13 NOTE — Progress Notes (Signed)
 Received notification from [employer] that his insurance now requires his Revlimid  to be obtained by [facility] and [employer] has forwarded recent script to this pharmacy. Phone #[phone] Fax# [phone]

## 2024-04-16 ENCOUNTER — Telehealth: Payer: Self-pay

## 2024-04-16 ENCOUNTER — Encounter: Admitting: Pediatrics

## 2024-04-16 NOTE — Telephone Encounter (Signed)
 Unable to speak directly with patient, left a detailed voicemail message advising him of change to his appointment time on Tuesday 04/17/2024.

## 2024-04-17 ENCOUNTER — Inpatient Hospital Stay: Attending: Oncology

## 2024-04-17 ENCOUNTER — Inpatient Hospital Stay: Admitting: Nurse Practitioner

## 2024-04-17 ENCOUNTER — Encounter: Payer: Self-pay | Admitting: Nurse Practitioner

## 2024-04-17 ENCOUNTER — Inpatient Hospital Stay

## 2024-04-17 VITALS — BP 118/85 | HR 100 | Temp 98.1°F | Resp 18 | Ht 73.0 in | Wt 218.3 lb

## 2024-04-17 DIAGNOSIS — R531 Weakness: Secondary | ICD-10-CM | POA: Diagnosis not present

## 2024-04-17 DIAGNOSIS — N19 Unspecified kidney failure: Secondary | ICD-10-CM | POA: Diagnosis not present

## 2024-04-17 DIAGNOSIS — Z86718 Personal history of other venous thrombosis and embolism: Secondary | ICD-10-CM | POA: Insufficient documentation

## 2024-04-17 DIAGNOSIS — G893 Neoplasm related pain (acute) (chronic): Secondary | ICD-10-CM | POA: Diagnosis not present

## 2024-04-17 DIAGNOSIS — I10 Essential (primary) hypertension: Secondary | ICD-10-CM | POA: Insufficient documentation

## 2024-04-17 DIAGNOSIS — C9000 Multiple myeloma not having achieved remission: Secondary | ICD-10-CM

## 2024-04-17 LAB — CMP (CANCER CENTER ONLY)
ALT: 42 U/L (ref 0–44)
AST: 30 U/L (ref 15–41)
Albumin: 4.3 g/dL (ref 3.5–5.0)
Alkaline Phosphatase: 66 U/L (ref 38–126)
Anion gap: 13 (ref 5–15)
BUN: 10 mg/dL (ref 6–20)
CO2: 20 mmol/L — ABNORMAL LOW (ref 22–32)
Calcium: 9.5 mg/dL (ref 8.9–10.3)
Chloride: 107 mmol/L (ref 98–111)
Creatinine: 1.24 mg/dL (ref 0.61–1.24)
GFR, Estimated: >60 mL/min
Glucose, Bld: 105 mg/dL — ABNORMAL HIGH (ref 70–99)
Potassium: 4.1 mmol/L (ref 3.5–5.1)
Sodium: 140 mmol/L (ref 135–145)
Total Bilirubin: 0.5 mg/dL (ref 0.0–1.2)
Total Protein: 7.1 g/dL (ref 6.5–8.1)

## 2024-04-17 LAB — CBC WITH DIFFERENTIAL (CANCER CENTER ONLY)
Abs Immature Granulocytes: 0 10*3/uL (ref 0.00–0.07)
Basophils Absolute: 0.1 10*3/uL (ref 0.0–0.1)
Basophils Relative: 2 %
Eosinophils Absolute: 0.1 10*3/uL (ref 0.0–0.5)
Eosinophils Relative: 2 %
HCT: 41.7 % (ref 39.0–52.0)
Hemoglobin: 14.2 g/dL (ref 13.0–17.0)
Immature Granulocytes: 0 %
Lymphocytes Relative: 37 %
Lymphs Abs: 1.5 10*3/uL (ref 0.7–4.0)
MCH: 26.7 pg (ref 26.0–34.0)
MCHC: 34.1 g/dL (ref 30.0–36.0)
MCV: 78.5 fL — ABNORMAL LOW (ref 80.0–100.0)
Monocytes Absolute: 0.5 10*3/uL (ref 0.1–1.0)
Monocytes Relative: 12 %
Neutro Abs: 1.9 10*3/uL (ref 1.7–7.7)
Neutrophils Relative %: 47 %
Platelet Count: 205 10*3/uL (ref 150–400)
RBC: 5.31 MIL/uL (ref 4.22–5.81)
RDW: 13.7 % (ref 11.5–15.5)
WBC Count: 4.1 10*3/uL (ref 4.0–10.5)
nRBC: 0 % (ref 0.0–0.2)

## 2024-04-17 MED ORDER — ZOLEDRONIC ACID 4 MG/100ML IV SOLN
4.0000 mg | Freq: Once | INTRAVENOUS | Status: AC
  Administered 2024-04-17: 4 mg via INTRAVENOUS
  Filled 2024-04-17: qty 100
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
## 2024-04-17 NOTE — Progress Notes (Addendum)
 Patient seen by Olam Ned NP today  Vitals are within treatment parameters:Yes  Increased 2 pounds  Labs are within treatment parameters: Yes   Treatment plan has been signed: Yes   Per physician team, Patient is ready for treatment and there are NO modifications to the treatment plan.   Ready for Zometa 

## 2024-04-19 LAB — MULTIPLE MYELOMA PANEL, SERUM
Albumin SerPl Elph-Mcnc: 3.7 g/dL (ref 2.9–4.4)
Albumin/Glob SerPl: 1.3 (ref 0.7–1.7)
Alpha 1: 0.2 g/dL (ref 0.0–0.4)
Alpha2 Glob SerPl Elph-Mcnc: 0.7 g/dL (ref 0.4–1.0)
B-Globulin SerPl Elph-Mcnc: 1.1 g/dL (ref 0.7–1.3)
Gamma Glob SerPl Elph-Mcnc: 0.9 g/dL (ref 0.4–1.8)
Globulin, Total: 3 g/dL (ref 2.2–3.9)
IgA: 130 mg/dL (ref 90–386)
IgG (Immunoglobin G), Serum: 953 mg/dL (ref 603–1613)
IgM (Immunoglobulin M), Srm: 56 mg/dL (ref 20–172)
Total Protein ELP: 6.7 g/dL (ref 6.0–8.5)

## 2024-04-20 ENCOUNTER — Telehealth: Payer: Self-pay | Admitting: *Deleted

## 2024-04-20 ENCOUNTER — Other Ambulatory Visit: Payer: Self-pay | Admitting: *Deleted

## 2024-04-20 ENCOUNTER — Encounter: Payer: Self-pay | Admitting: Oncology

## 2024-04-20 ENCOUNTER — Other Ambulatory Visit: Payer: Self-pay | Admitting: Nurse Practitioner

## 2024-04-20 ENCOUNTER — Other Ambulatory Visit: Payer: Self-pay | Admitting: Oncology

## 2024-04-20 DIAGNOSIS — C9000 Multiple myeloma not having achieved remission: Secondary | ICD-10-CM

## 2024-04-20 MED ORDER — AMLODIPINE BESYLATE 5 MG PO TABS: 5.0000 mg | ORAL_TABLET | Freq: Every day | ORAL | 5 refills | Status: AC

## 2024-04-20 NOTE — Telephone Encounter (Signed)
 Call from [facility] re: referral sent on 02/27/2025. 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 Has myeloma and on Revlimid  as well.  Was informed that his [facility] Plan does not allow them to complete the PA. Must come from referring provider office. Forwarded this information to managed care.

## 2024-04-24 ENCOUNTER — Other Ambulatory Visit: Payer: Self-pay | Admitting: *Deleted

## 2024-04-24 DIAGNOSIS — C9000 Multiple myeloma not having achieved remission: Secondary | ICD-10-CM

## 2024-04-24 MED ORDER — LENALIDOMIDE 5 MG PO CAPS: 5.0000 mg | ORAL_CAPSULE | Freq: Every day | ORAL | 0 refills | Status: AC

## 2024-05-21 ENCOUNTER — Inpatient Hospital Stay

## 2024-05-21 ENCOUNTER — Inpatient Hospital Stay: Admitting: Oncology
